# Patient Record
Sex: Male | Born: 1955 | Hispanic: Yes | Smoking: Never smoker
Health system: Southern US, Community
[De-identification: ages and names within clinical notes are randomized; demographics above are authoritative.]

## PROBLEM LIST (undated history)

## (undated) DIAGNOSIS — M1990 Unspecified osteoarthritis, unspecified site: Secondary | ICD-10-CM

## (undated) DIAGNOSIS — I10 Essential (primary) hypertension: Secondary | ICD-10-CM

## (undated) DIAGNOSIS — D649 Anemia, unspecified: Secondary | ICD-10-CM

## (undated) DIAGNOSIS — E119 Type 2 diabetes mellitus without complications: Secondary | ICD-10-CM

## (undated) DIAGNOSIS — Z87442 Personal history of urinary calculi: Secondary | ICD-10-CM

## (undated) DIAGNOSIS — Z8719 Personal history of other diseases of the digestive system: Secondary | ICD-10-CM

## (undated) HISTORY — PX: EYE SURGERY: SHX253

## (undated) HISTORY — DX: Anemia, unspecified: D64.9

## (undated) HISTORY — DX: Type 2 diabetes mellitus without complications: E11.9

## (undated) HISTORY — DX: Unspecified osteoarthritis, unspecified site: M19.90

## (undated) HISTORY — DX: Essential (primary) hypertension: I10

---

## 1999-05-31 DIAGNOSIS — B1920 Unspecified viral hepatitis C without hepatic coma: Secondary | ICD-10-CM

## 1999-05-31 HISTORY — DX: Unspecified viral hepatitis C without hepatic coma: B19.20

## 2015-02-28 ENCOUNTER — Ambulatory Visit (INDEPENDENT_AMBULATORY_CARE_PROVIDER_SITE_OTHER): Payer: Managed Care, Other (non HMO) | Admitting: Family Medicine

## 2015-02-28 VITALS — BP 128/80 | HR 91 | Temp 98.4°F | Resp 18 | Ht 70.0 in | Wt 187.4 lb

## 2015-02-28 DIAGNOSIS — L910 Hypertrophic scar: Secondary | ICD-10-CM | POA: Diagnosis not present

## 2015-02-28 MED ORDER — FLUOCINONIDE-E 0.05 % EX CREA: 1.0000 "application " | TOPICAL_CREAM | Freq: Two times a day (BID) | CUTANEOUS | Status: DC

## 2015-02-28 NOTE — Patient Instructions (Addendum)
This appears to be a keloid, or hypertrophic scar. This happens to some people after a significant injury to the skin. What happens is that the skin forms a significantly extra amount of scar tissue forming a thickened skin persists the rest of the person's life.  If you feel that this is increasing in size, or has changed very little in 2 weeks, please call me and I will arrange a dermatology referral

## 2015-02-28 NOTE — Progress Notes (Signed)
 @  This chart was scribed for Elvina Sidle, MD by Andrew Au, ED Scribe. This patient was seen in room 8 and the patient's care was started at 3:16 PM.  Patient ID: Kenneth Joseph MRN: [id number], DOB: [birth year], 59 y.o. Date of Encounter: 02/28/2015, 3:17 PM  Primary Physician: No primary care provider on file.  Chief Complaint:  Chief Complaint  Patient presents with   Knee Injury    Scraped knee on 11/11/14-hit smae spot about a month ago & removed skin again. Not healing properly & itches around the edge    HPI: 59 y.o. year old male with history below presents with wound. Pt fell on 6/14 and scrapped his knee. States it took the wound about  1 month heal but bumped his knee causing skin to fall off and the wound to bleed. Wound is scarred over at this time with discoloration but does not have pan to knee. States pain went away about 1 month ago. He reports its been over 3 months and wound has not healed properly. He has tried cortisone cream without relief to scar.    Past Medical History  Diagnosis Date   Anemia    Arthritis    Diabetes mellitus without complication (HCC)    Hypertension      Home Meds: Prior to Admission medications   Medication Sig Start Date End Date Taking? Authorizing Provider  amLODipine-olmesartan (AZOR) 10-40 MG tablet Take 0.5 tablets by mouth daily.   Yes Historical Provider, MD  meclizine (ANTIVERT) 25 MG tablet Take 25 mg by mouth daily as needed for dizziness.   Yes Historical Provider, MD  MELOXICAM PO Take by mouth daily.   Yes Historical Provider, MD  SITagliptin-MetFORMIN HCl (JANUMET PO) Take by mouth 2 (two) times daily.   Yes Historical Provider, MD    Allergies: No Known Allergies  Social History   Social History   Marital Status: Legally Separated    Spouse Name: N/A   Number of Children: N/A   Years of Education: N/A   Occupational History   Not on file.   Social History Main Topics   Smoking status:  Never Smoker    Smokeless tobacco: Not on file   Alcohol Use: 0.0 oz/week    0 Standard drinks or equivalent per week     Comment: occasionally   Drug Use: Not on file   Sexual Activity: Not on file   Other Topics Concern   Not on file   Social History Narrative   No narrative on file     Review of Systems: Constitutional: negative for chills, fever, night sweats, weight changes, or fatigue  HEENT: negative for vision changes, hearing loss, congestion, rhinorrhea, ST, epistaxis, or sinus pressure Cardiovascular: negative for chest pain or palpitations Respiratory: negative for hemoptysis, wheezing, shortness of breath, or cough Abdominal: negative for abdominal pain, nausea, vomiting, diarrhea, or constipation Dermatological: negative for rash Neurologic: negative for headache, dizziness, or syncope All other systems reviewed and are otherwise negative with the exception to those above and in the HPI.   Physical Exam: Blood pressure 128/80, pulse 91, temperature 98.4 F (36.9 C), temperature source Oral, resp. rate 18, height  (1.778 m), weight 187 lb 6 oz (84.993 kg), SpO2 97 %., Body mass index is 26.89 kg/(m^2). General: Well developed, well nourished, in no acute distress. Head: Normocephalic, atraumatic, eyes without discharge, sclera non-icteric, nares are without discharge. Bilateral auditory canals clear, TM's are without perforation, pearly grey and translucent with reflective  cone of light bilaterally. Oral cavity moist, posterior pharynx without exudate, erythema, peritonsillar abscess, or post nasal drip.  Neck: Supple. No thyromegaly. Full ROM. No lymphadenopathy. Lungs: Clear bilaterally to auscultation without wheezes, rales, or rhonchi. Breathing is unlabored. Heart: RRR with S1 S2. No murmurs, rubs, or gallops appreciated. Abdomen: Soft, non-tender, non-distended with normoactive bowel sounds. No hepatomegaly. No rebound/guarding. No obvious abdominal  masses. Msk:  Strength and tone normal for age. Extremities/Skin: Warm and dry. No clubbing or cyanosis. No edema. No rashes 2cm polygonal thicken violaceouslesion over left inter patellar tendon Neuro: Alert and oriented X 3. Moves all extremities spontaneously. Gait is normal. CNII-XII grossly in tact. Psych:  Responds to questions appropriately with a normal affect.   ASSESSMENT AND PLAN:  59 y.o. year old male with what appears to be a keloid. We are going to try high-dose terrorize for 2 weeks and if there is no improvement, refer to dermatology This chart was scribed in my presence and reviewed by me personally.    ICD-9-CM ICD-10-CM   1. Keloid 701.4 L91.0 fluocinonide-emollient (LIDEX-E) 0.05 % cream     Signed, Elvina Sidle, MD    By signing my name below, I, Raven Small, attest that this documentation has been prepared under the direction and in the presence of Elvina Sidle, MD.  Electronically Signed: Andrew Au, ED Scribe. 02/28/2015. 3:24 PM.  Signed, Elvina Sidle, MD 02/28/2015 3:17 PM

## 2015-03-26 ENCOUNTER — Ambulatory Visit (INDEPENDENT_AMBULATORY_CARE_PROVIDER_SITE_OTHER): Payer: Managed Care, Other (non HMO) | Admitting: Family Medicine

## 2015-03-26 ENCOUNTER — Ambulatory Visit (INDEPENDENT_AMBULATORY_CARE_PROVIDER_SITE_OTHER): Payer: Managed Care, Other (non HMO)

## 2015-03-26 VITALS — BP 110/76 | HR 87 | Temp 98.3°F | Resp 18 | Ht 70.0 in | Wt 189.0 lb

## 2015-03-26 DIAGNOSIS — R1031 Right lower quadrant pain: Secondary | ICD-10-CM | POA: Diagnosis not present

## 2015-03-26 DIAGNOSIS — R319 Hematuria, unspecified: Secondary | ICD-10-CM

## 2015-03-26 DIAGNOSIS — R3911 Hesitancy of micturition: Secondary | ICD-10-CM | POA: Diagnosis not present

## 2015-03-26 DIAGNOSIS — R109 Unspecified abdominal pain: Secondary | ICD-10-CM | POA: Diagnosis not present

## 2015-03-26 LAB — POCT URINALYSIS DIP (MANUAL ENTRY)
GLUCOSE UA: NEGATIVE
Leukocytes, UA: NEGATIVE
Nitrite, UA: NEGATIVE
Protein Ur, POC: >=300 — AB
Urobilinogen, UA: 1
pH, UA: 5

## 2015-03-26 LAB — COMPLETE METABOLIC PANEL WITH GFR
ALBUMIN: 4.3 g/dL (ref 3.6–5.1)
ALK PHOS: 71 U/L (ref 40–115)
ALT: 21 U/L (ref 9–46)
AST: 24 U/L (ref 10–35)
BILIRUBIN TOTAL: 0.6 mg/dL (ref 0.2–1.2)
BUN: 14 mg/dL (ref 7–25)
CALCIUM: 9.4 mg/dL (ref 8.6–10.3)
CO2: 25 mmol/L (ref 20–31)
Chloride: 103 mmol/L (ref 98–110)
Creat: 1.22 mg/dL (ref 0.70–1.25)
GFR, EST AFRICAN AMERICAN: 74 mL/min (ref >=60)
GFR, EST NON AFRICAN AMERICAN: 64 mL/min (ref >=60)
Glucose, Bld: 111 mg/dL — ABNORMAL HIGH (ref 65–99)
POTASSIUM: 4.3 mmol/L (ref 3.5–5.3)
SODIUM: 139 mmol/L (ref 135–146)
TOTAL PROTEIN: 7.5 g/dL (ref 6.1–8.1)

## 2015-03-26 LAB — POC MICROSCOPIC URINALYSIS (UMFC): MUCUS RE: ABSENT

## 2015-03-26 LAB — POCT CBC
GRANULOCYTE PERCENT: 72.8 % (ref 37–80)
HEMATOCRIT: 49.5 % (ref 43.5–53.7)
Hemoglobin: 16.9 g/dL (ref 14.1–18.1)
Lymph, poc: 1.7 (ref 0.6–3.4)
MCH, POC: 32.7 pg — AB (ref 27–31.2)
MCHC: 34.2 g/dL (ref 31.8–35.4)
MCV: 95.5 fL (ref 80–97)
MID (CBC): 0.7 (ref 0–0.9)
MPV: 8.7 fL (ref 0–99.8)
POC GRANULOCYTE: 6.6 (ref 2–6.9)
POC LYMPH %: 19.1 % (ref 10–50)
POC MID %: 8.1 % (ref 0–12)
Platelet Count, POC: 147 10*3/uL (ref 142–424)
RBC: 5.18 M/uL (ref 4.69–6.13)
RDW, POC: 13.8 %
WBC: 9 10*3/uL (ref 4.6–10.2)

## 2015-03-26 MED ORDER — CIPROFLOXACIN HCL 500 MG PO TABS: 500.0000 mg | ORAL_TABLET | Freq: Two times a day (BID) | ORAL | Status: DC

## 2015-03-26 NOTE — Patient Instructions (Signed)
Blood in urine may be due to kidney stone or possible prostate or bladder infection with your difficulty in starting urination.  Start Cipro as discussed, and follow up in next 3 days. Sooner or to the emergency room if you are unable to urinate.  You should receive a call or letter about your lab results within the next week to 10 days.  Return to the clinic or go to the nearest emergency room if any of your symptoms worsen or new symptoms occur.  Hematuria, Adult Hematuria is blood in your urine. It can be caused by a bladder infection, kidney infection, prostate infection, kidney stone, or cancer of your urinary tract. Infections can usually be treated with medicine, and a kidney stone usually will pass through your urine. If neither of these is the cause of your hematuria, further workup to find out the reason may be needed. It is very important that you tell your health care provider about any blood you see in your urine, even if the blood stops without treatment or happens without causing pain. Blood in your urine that happens and then stops and then happens again can be a symptom of a very serious condition. Also, pain is not a symptom in the initial stages of many urinary cancers. HOME CARE INSTRUCTIONS   Drink lots of fluid, 3-4 quarts a day. If you have been diagnosed with an infection, cranberry juice is especially recommended, in addition to large amounts of water.  Avoid caffeine, tea, and carbonated beverages because they tend to irritate the bladder.  Avoid alcohol because it may irritate the prostate.  Take all medicines as directed by your health care provider.  If you were prescribed an antibiotic medicine, finish it all even if you start to feel better.  If you have been diagnosed with a kidney stone, follow your health care provider's instructions regarding straining your urine to catch the stone.  Empty your bladder often. Avoid holding urine for long periods of  time.  After a bowel movement, women should cleanse front to back. Use each tissue only once.  Empty your bladder before and after sexual intercourse if you are a male. SEEK MEDICAL CARE IF:  You develop back pain.  You have a fever.  You have a feeling of sickness in your stomach (nausea) or vomiting.  Your symptoms are not better in 3 days. Return sooner if you are getting worse. SEEK IMMEDIATE MEDICAL CARE IF:   You develop severe vomiting and are unable to keep the medicine down.  You develop severe back or abdominal pain despite taking your medicines.  You begin passing a large amount of blood or clots in your urine.  You feel extremely weak or faint, or you pass out. MAKE SURE YOU:   Understand these instructions.  Will watch your condition.  Will get help right away if you are not doing well or get worse.   This information is not intended to replace advice given to you by your health care provider. Make sure you discuss any questions you have with your health care provider.   Document Released: 05/16/2005 Document Revised: 06/06/2014 Document Reviewed: 01/14/2013 Elsevier Interactive Patient Education [employer] Inc[employer]

## 2015-03-26 NOTE — Progress Notes (Addendum)
Subjective:  This chart was scribed for Meredith Staggers, MD by Broadus John, Medical Scribe. This patient was seen in Room 5 and the patient's care was started at 1:44 PM.   Patient ID: Kenneth Joseph, male    DOB: 1955/12/24, 59 y.o.   MRN: [id number]  Chief Complaint  Patient presents with  . Abdominal Pain    this morning   . Back Pain    lower left side, last week pain on rt side down towards groin   . Hematuria    today     HPI HPI Comments: Kenneth Joseph is a 59 y.o. male with a PMHx DM, HTN, HLD of who presents to [facility] complaining of a suprapubic abdominal pain and hematuria, onset today.  Pt reports that the abdominal pain started last week in the right flank but has resolved. However the current abdominal pain started this morning in his supra pubic area, and he indicates that he does not experience the pain currently, only upset stomack, however he has been experiencing tenderness in the left side of the abdomen that radiates to his back, the pain is still mildly present now. Pt indicates that he was not experiencing any urinary symptoms yesterday, last urination was last night, however he has been experiencing some urinary retention today, and he was only able to urinate very small amounts that was accompanied with a reddish color. He also notes symptoms of nausea, and very mild diarrhea- pt had a bowel movement this morning. Pt denies constipation, hematochezia, vomiting, fever. Pt has a history of 2 episodes of kidney stones previously, however no history of bladder infection, or previous prostrated testing. Pt reports that he did have a colonoscopy done, for which he had some polyps present. Thinks he had normal DRE 3 years ago.   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   There are no active problems to display for this patient.  Past Medical History  Diagnosis Date  . Anemia     . Arthritis   . Diabetes mellitus without complication (HCC)   . Hypertension    History reviewed. No pertinent past surgical history. No Known Allergies Prior to Admission medications   Medication Sig Start Date End Date Taking? Authorizing Provider  amLODipine-olmesartan (AZOR) 10-40 MG tablet Take 0.5 tablets by mouth daily.   Yes Historical Provider, MD  atorvastatin (LIPITOR) 80 MG tablet Take 80 mg by mouth daily at 6 PM.   Yes Historical Provider, MD  fluocinonide-emollient (LIDEX-E) 0.05 % cream Apply 1 application topically 2 (two) times daily. 02/28/15  Yes Elvina Sidle, MD  meclizine (ANTIVERT) 25 MG tablet Take 25 mg by mouth daily as needed for dizziness.   Yes Historical Provider, MD  meloxicam (MOBIC) 15 MG tablet Take 15 mg by mouth daily.   Yes Historical Provider, MD  SITagliptin-MetFORMIN HCl (JANUMET PO) Take by mouth 2 (two) times daily.   Yes Historical Provider, MD   Social History   Social History  . Marital Status: Legally Separated    Spouse Name: N/A  . Number of Children: N/A  . Years of Education: N/A   Occupational History  . Not on file.   Social History Main Topics  . Smoking status: Never Smoker   . Smokeless tobacco: Not on file  . Alcohol Use: 0.0 oz/week    0 Standard drinks or equivalent per week     Comment:  occasionally  . Drug Use: Not on file  . Sexual Activity: Not on file   Other Topics Concern  . Not on file   Social History Narrative    Review of Systems  Constitutional: Negative for fever.  Gastrointestinal: Positive for nausea, abdominal pain and diarrhea. Negative for vomiting, constipation and blood in stool.  Genitourinary: Positive for hematuria, flank pain and difficulty urinating.  Musculoskeletal: Positive for back pain.       Objective:   Physical Exam  Constitutional: He is oriented to person, place, and time. He appears well-developed and well-nourished. No distress.  HENT:  Head: Normocephalic and  atraumatic.  Eyes: EOM are normal. Pupils are equal, round, and reactive to light.  Neck: Neck supple.  Cardiovascular: Normal rate, regular rhythm and normal heart sounds.  Exam reveals no gallop and no friction rub.   No murmur heard. Pulmonary/Chest: Breath sounds normal. No respiratory distress. He has no wheezes. He has no rales.  Abdominal: There is tenderness. There is CVA tenderness (Minimal discomfort left CVA) and tenderness at McBurney's point (Minimal tenderness).  No discomfort in the back. Negative heel jar.   Genitourinary: Prostate is not tender (no tenderness, no apparent nodules, but exam limited to distal prostate only. ).  Neurological: He is alert and oriented to person, place, and time. No cranial nerve deficit.  Skin: Skin is warm and dry.  Psychiatric: He has a normal mood and affect. His behavior is normal.  Nursing note and vitals reviewed.   Filed Vitals:   03/26/15 1233  BP: 110/76  Pulse: 87  Temp: 98.3 F (36.8 C)  TempSrc: Oral  Resp: 18  Height:  (1.778 m)  Weight: 189 lb (85.73 kg)  SpO2: 96%   Results for orders placed or performed in visit on 03/26/15  POCT urinalysis dipstick  Result Value Ref Range   Color, UA brown (A) yellow   Clarity, UA cloudy (A) clear   Glucose, UA negative negative   Bilirubin, UA moderate (A) negative   Ketones, POC UA small (15) (A) negative   Spec Grav, UA >=1.030    Blood, UA large (A) negative   pH, UA 5.0    Protein Ur, POC >=300 (A) negative   Urobilinogen, UA 1.0    Nitrite, UA Negative Negative   Leukocytes, UA Negative Negative  POCT Microscopic Urinalysis ([facility])  Result Value Ref Range   WBC,UR,HPF,POC Few (A) None WBC/hpf   RBC,UR,HPF,POC Many (A) None RBC/hpf   Bacteria None None, Too numerous to count   Mucus Absent Absent   Epithelial Cells, UR Per Microscopy Few (A) None, Too numerous to count cells/hpf  POCT CBC  Result Value Ref Range   WBC 9.0 4.6 - 10.2 K/uL   Lymph, poc 1.7 0.6  - 3.4   POC LYMPH PERCENT 19.1 10 - 50 %L   MID (cbc) 0.7 0 - 0.9   POC MID % 8.1 0 - 12 %M   POC Granulocyte 6.6 2 - 6.9   Granulocyte percent 72.8 37 - 80 %G   RBC 5.18 4.69 - 6.13 M/uL   Hemoglobin 16.9 14.1 - 18.1 g/dL   HCT, POC 16.1 09.6 - 53.7 %   MCV 95.5 80 - 97 fL   MCH, POC 32.7 (A) 27 - 31.2 pg   MCHC 34.2 31.8 - 35.4 g/dL   RDW, POC 04.5 %   Platelet Count, POC 147 142 - 424 K/uL   MPV 8.7 0 - 99.8 fL  [facility] reading (PRIMARY) by  Dr. Neva SeatGreene: Abd 1 view - multiple phlebolith or nephroliths in bladder area.       Assessment & Plan:   Kenneth Joseph is a 59 y.o. male Hematuria - Plan: POCT urinalysis dipstick, POCT Microscopic Urinalysis ([facility]), PSA, DG Abd 1 View, COMPLETE METABOLIC PANEL WITH GFR, ciprofloxacin (CIPRO) 500 MG tablet, CANCELED: Basic metabolic panel  RLQ abdominal pain - Plan: POCT CBC, COMPLETE METABOLIC PANEL WITH GFR, Urine culture, CANCELED: Basic metabolic panel  Urinary hesitancy - Plan: POCT urinalysis dipstick, POCT Microscopic Urinalysis ([facility]), PSA, Urine culture, ciprofloxacin (CIPRO) 500 MG tablet  Left flank pain - Plan: DG Abd 1 View  Possible nephrolithiasis, or acute prostatitis with some hesitancy with urination. No apparent urinary retention on exam, but with some difficulty voiding morning of visit, did give caution that this may progress throughout the day. No apparent tenderness on prostate exam. Recent flank pain suggestive of nephrolithiasis. - Will cover with Cipro 500 mg twice a day, urine culture, psa, plan recheck in 48-72 hours for possible CT scanning, or to emergency room if signs or symptoms of retention or other worsening.  Meds ordered this encounter  Medications  . atorvastatin (LIPITOR) 80 MG tablet    Sig: Take 80 mg by mouth daily at 6 PM.  . meloxicam (MOBIC) 15 MG tablet    Sig: Take 15 mg by mouth daily.  . ciprofloxacin (CIPRO) 500 MG tablet    Sig: Take 1 tablet (500 mg total) by mouth 2 (two) times daily.     Dispense:  20 tablet    Refill:  0   Patient Instructions  Blood in urine may be due to kidney stone or possible prostate or bladder infection with your difficulty in starting urination.  Start Cipro as discussed, and follow up in next 3 days. Sooner or to the emergency room if you are unable to urinate.  You should receive a call or letter about your lab results within the next week to 10 days.  Return to the clinic or go to the nearest emergency room if any of your symptoms worsen or new symptoms occur.  Hematuria, Adult Hematuria is blood in your urine. It can be caused by a bladder infection, kidney infection, prostate infection, kidney stone, or cancer of your urinary tract. Infections can usually be treated with medicine, and a kidney stone usually will pass through your urine. If neither of these is the cause of your hematuria, further workup to find out the reason may be needed. It is very important that you tell your health care provider about any blood you see in your urine, even if the blood stops without treatment or happens without causing pain. Blood in your urine that happens and then stops and then happens again can be a symptom of a very serious condition. Also, pain is not a symptom in the initial stages of many urinary cancers. HOME CARE INSTRUCTIONS   Drink lots of fluid, 3-4 quarts a day. If you have been diagnosed with an infection, cranberry juice is especially recommended, in addition to large amounts of water.  Avoid caffeine, tea, and carbonated beverages because they tend to irritate the bladder.  Avoid alcohol because it may irritate the prostate.  Take all medicines as directed by your health care provider.  If you were prescribed an antibiotic medicine, finish it all even if you start to feel better.  If you have been diagnosed with a kidney stone, follow your health care  provider's instructions regarding straining your urine to catch the stone.  Empty your  bladder often. Avoid holding urine for long periods of time.  After a bowel movement, women should cleanse front to back. Use each tissue only once.  Empty your bladder before and after sexual intercourse if you are a male. SEEK MEDICAL CARE IF:  You develop back pain.  You have a fever.  You have a feeling of sickness in your stomach (nausea) or vomiting.  Your symptoms are not better in 3 days. Return sooner if you are getting worse. SEEK IMMEDIATE MEDICAL CARE IF:   You develop severe vomiting and are unable to keep the medicine down.  You develop severe back or abdominal pain despite taking your medicines.  You begin passing a large amount of blood or clots in your urine.  You feel extremely weak or faint, or you pass out. MAKE SURE YOU:   Understand these instructions.  Will watch your condition.  Will get help right away if you are not doing well or get worse.   This information is not intended to replace advice given to you by your health care provider. Make sure you discuss any questions you have with your health care provider.   Document Released: 05/16/2005 Document Revised: 06/06/2014 Document Reviewed: 01/14/2013 Elsevier Interactive Patient Education [employer]          By signing my name below, I, Rawaa Kenneth Joseph, attest that this documentation has been prepared under the direction and in the presence of Meredith Staggers, MD.  Watt Climes Joseph, Medical Scribe. 03/26/2015.  1:59 PM.   I personally performed the services described in this documentation, which was scribed in my presence. The recorded information has been reviewed and considered, and addended by me as needed.

## 2015-03-27 LAB — URINE CULTURE

## 2015-03-27 LAB — PSA: PSA: 0.93 ng/mL (ref <=4.00)

## 2015-04-21 ENCOUNTER — Ambulatory Visit (INDEPENDENT_AMBULATORY_CARE_PROVIDER_SITE_OTHER): Payer: Managed Care, Other (non HMO) | Admitting: Family Medicine

## 2015-04-21 VITALS — BP 132/80 | HR 95 | Temp 97.4°F | Resp 18 | Ht 71.0 in | Wt 186.0 lb

## 2015-04-21 DIAGNOSIS — R1030 Lower abdominal pain, unspecified: Secondary | ICD-10-CM

## 2015-04-21 DIAGNOSIS — R319 Hematuria, unspecified: Secondary | ICD-10-CM

## 2015-04-21 DIAGNOSIS — N3943 Post-void dribbling: Secondary | ICD-10-CM

## 2015-04-21 DIAGNOSIS — R109 Unspecified abdominal pain: Secondary | ICD-10-CM

## 2015-04-21 LAB — POCT URINALYSIS DIP (MANUAL ENTRY)
Bilirubin, UA: NEGATIVE
Glucose, UA: NEGATIVE
Ketones, POC UA: NEGATIVE
Leukocytes, UA: NEGATIVE
Nitrite, UA: NEGATIVE
Protein Ur, POC: 30 — AB
Spec Grav, UA: >=1.03
Urobilinogen, UA: 0.2
pH, UA: 5

## 2015-04-21 LAB — POC MICROSCOPIC URINALYSIS (UMFC)

## 2015-04-21 MED ORDER — HYDROCODONE-ACETAMINOPHEN 5-325 MG PO TABS: 1.0000 | ORAL_TABLET | Freq: Three times a day (TID) | ORAL | Status: DC | PRN

## 2015-04-21 MED ORDER — KETOROLAC TROMETHAMINE 30 MG/ML IJ SOLN
30.0000 mg | Freq: Once | INTRAMUSCULAR | Status: AC
  Administered 2015-04-21: 30 mg via INTRAMUSCULAR

## 2015-04-21 MED ORDER — TAMSULOSIN HCL 0.4 MG PO CAPS: 0.4000 mg | ORAL_CAPSULE | Freq: Every day | ORAL | Status: DC

## 2015-04-21 NOTE — Patient Instructions (Signed)
GO OVER TO THE [facility].  LET THEM KNOW THAT YOU NEED TO REGISTER AS AN OUTPATIENT FOR A CT SCAN.

## 2015-04-21 NOTE — Progress Notes (Signed)
Chief Complaint:  Chief Complaint  Patient presents with  . Urinary Retention    saturday   . Abdominal Pain  . Back Pain    low back     HPI: Kenneth Joseph is a 59 y.o. male who reports to [facility] today complaining of worsening 4/10-8/10 left sided abd pain and also left  flank pain. Prior he had it on the right side. He has a hx of  kidney stones, he has has urinary dribbling. He has had chills, started back again from 10/27 visit  In last 5 days and worsen this mormning at 2 am. He was on cipro but Urine cx was negative so stopped . Colonscopy with some some polyps  X 2 exams, he is UTD but needs to get a fu colonscopy . He has continued to dribble when he urinates sicne last OV, denies BPH, he had a DRE which was pretty unremarkable. No gross hematuria or melena. No n/w/t. No unintentional weightloss He had a dizzy spell and feeling weak due to pain.    Please see OV below from 03/26/2015: Kenneth Joseph is a 59 y.o. male with a PMHx DM, HTN, HLD of who presents to [facility] complaining of a suprapubic abdominal pain and hematuria, onset today.  Pt reports that the abdominal pain started last week in the right flank but has resolved. However the current abdominal pain started this morning in his supra pubic area, and he indicates that he does not experience the pain currently, only upset stomack, however he has been experiencing tenderness in the left side of the abdomen that radiates to his back, the pain is still mildly present now. Pt indicates that he was not experiencing any urinary symptoms yesterday, last urination was last night, however he has been experiencing some urinary retention today, and he was only able to urinate very small amounts that was accompanied with a reddish color. He also notes symptoms of nausea, and very mild diarrhea- pt had a bowel movement this morning. Pt denies constipation, hematochezia, vomiting, fever. Pt has a history of 2  episodes of kidney stones previously, however no history of bladder infection, or previous prostrated testing. Pt reports that he did have a colonoscopy done, for which he had some polyps present. Thinks he had normal DRE 3 years ago.   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    IMPRESSION: Indeterminate 3 mm density over the left sacral ala, potential stone given the history. Confident detection of calculus is limited by numerous pelvic phleboliths. No calculi seen over the renal shadows.   Electronically Signed  By: Marnee SpringJonathon Watts M.D.  On: 03/26/2015 15:05  Past Medical History  Diagnosis Date  . Anemia   . Arthritis   . Diabetes mellitus without complication (HCC)   . Hypertension    History reviewed. No pertinent past surgical history. Social History   Social History  . Marital Status: Legally Separated    Spouse Name: N/A  . Number of Children: N/A  . Years of Education: N/A   Social History Main Topics  . Smoking status: Never Smoker   . Smokeless tobacco: None  . Alcohol Use: 0.0 oz/week    0 Standard drinks or equivalent per week     Comment: occasionally  . Drug Use: None  . Sexual Activity: Not Asked   Other Topics Concern  . None   Social  History Narrative   Family History  Problem Relation Age of Onset  . Hypertension Mother   . Mental illness Mother   . Diabetes Father   . Heart disease Father   . Hypertension Father   . Stroke Father    No Known Allergies Prior to Admission medications   Medication Sig Start Date End Date Taking? Authorizing Provider  allopurinol (ZYLOPRIM) 300 MG tablet Take 300 mg by mouth daily.   Yes Historical Provider, MD  amLODipine-olmesartan (AZOR) 10-40 MG tablet Take 0.5 tablets by mouth daily.   Yes Historical Provider, MD  atorvastatin (LIPITOR) 80 MG tablet Take 80 mg by mouth daily at 6 PM.   Yes Historical Provider, MD  ciprofloxacin  (CIPRO) 500 MG tablet Take 1 tablet (500 mg total) by mouth 2 (two) times daily. 03/26/15  Yes Shade Flood, MD  colchicine 0.6 MG tablet Take 0.6 mg by mouth daily.   Yes Historical Provider, MD  fluocinonide-emollient (LIDEX-E) 0.05 % cream Apply 1 application topically 2 (two) times daily. 02/28/15  Yes Elvina Sidle, MD  meclizine (ANTIVERT) 25 MG tablet Take 25 mg by mouth daily as needed for dizziness.   Yes Historical Provider, MD  SITagliptin-MetFORMIN HCl (JANUMET PO) Take by mouth 2 (two) times daily.   Yes Historical Provider, MD  meloxicam (MOBIC) 15 MG tablet Take 15 mg by mouth daily.    Historical Provider, MD     ROS: The patient denies fevers, night sweats, unintentional weight loss, chest pain, palpitations, wheezing, dyspnea on exertion, nausea, vomiting, dysuria, melena, numbness, weakness, or tingling.   All other systems have been reviewed and were otherwise negative with the exception of those mentioned in the HPI and as above.    PHYSICAL EXAM: Filed Vitals:   04/21/15 1626  BP: 132/80  Pulse: 95  Temp: 97.4 F (36.3 C)  Resp: 18   Body mass index is 25.95 kg/(m^2).   General: Alert, no acute distress HEENT:  Normocephalic, atraumatic, oropharynx patent. EOMI, PERRLA Cardiovascular:  Regular rate and rhythm, no rubs murmurs or gallops.  No pedal edema.  Respiratory: Clear to auscultation bilaterally.  No wheezes, rales, or rhonchi.  No cyanosis, no use of accessory musculature Abdominal: No organomegaly, abdomen is soft and  LLQ + tender, positive bowel sounds. No masses. + Left CVA tenderness Skin: No rashes. Neurologic: Facial musculature symmetric. Psychiatric: Patient acts appropriately throughout our interaction. Lymphatic: No cervical or submandibular lymphadenopathy Musculoskeletal: Gait intact. No edema, tenderness   LABS: Results for orders placed or performed in visit on 04/21/15  POCT urinalysis dipstick  Result Value Ref Range   Color,  UA yellow yellow   Clarity, UA clear clear   Glucose, UA negative negative   Bilirubin, UA negative negative   Ketones, POC UA negative negative   Spec Grav, UA >=1.030    Blood, UA large (A) negative   pH, UA 5.0    Protein Ur, POC =30 (A) negative   Urobilinogen, UA 0.2    Nitrite, UA Negative Negative   Leukocytes, UA Negative Negative  POCT Microscopic Urinalysis ([facility])  Result Value Ref Range   WBC,UR,HPF,POC Few (A) None WBC/hpf   RBC,UR,HPF,POC Many (A) None RBC/hpf   Bacteria None None, Too numerous to count   Mucus Present (A) Absent   Epithelial Cells, UR Per Microscopy Few (A) None, Too numerous to count cells/hpf     EKG/XRAY:   Primary read interpreted by Dr. Conley Rolls at [facility].   ASSESSMENT/PLAN: Encounter Diagnoses  Name Primary?  . Urinary dribbling   . Hematuria   . Left flank pain   . Lower abdominal pain Yes   Stat CT scan CB to Dr Conley Rolls, rule out hydronephrosis , renal stones, less likely appenddicitis GB or inflammatory in etiology since patient has normal labs and sxs wax and wane until worsen in last 5 days , the worse is today. The abd and left flank pain moves, he has ahd some chills due to pain but without fever, he is trying to push fluids.  UTD on colonoscopy, last visit had unremarkable DRE and normal PSA Rx norco,  Flomax, he was given a strainer and also toradol 30 mg IM x 1 in office. Advise to go get Ct scan, push clear fluids at home   Gross sideeffects, risk and benefits, and alternatives of medications d/w patient. Patient is aware that all medications have potential sideeffects and we are unable to predict every sideeffect or drug-drug interaction that may occur.  Khylin Gutridge DO  04/21/2015 6:46 PM

## 2015-04-22 ENCOUNTER — Telehealth: Payer: Self-pay | Admitting: Family Medicine

## 2015-04-22 ENCOUNTER — Ambulatory Visit (HOSPITAL_COMMUNITY): Admission: RE | Admit: 2015-04-22 | Payer: Managed Care, Other (non HMO) | Source: Ambulatory Visit

## 2015-04-22 ENCOUNTER — Ambulatory Visit (HOSPITAL_COMMUNITY)
Admission: RE | Admit: 2015-04-22 | Discharge: 2015-04-22 | Disposition: A | Discharge status: Home / Self Care | Payer: Managed Care, Other (non HMO) | Source: Ambulatory Visit | Attending: Family Medicine | Admitting: Family Medicine

## 2015-04-22 DIAGNOSIS — N400 Enlarged prostate without lower urinary tract symptoms: Secondary | ICD-10-CM | POA: Diagnosis not present

## 2015-04-22 DIAGNOSIS — N132 Hydronephrosis with renal and ureteral calculous obstruction: Secondary | ICD-10-CM | POA: Insufficient documentation

## 2015-04-22 DIAGNOSIS — R110 Nausea: Secondary | ICD-10-CM | POA: Insufficient documentation

## 2015-04-22 DIAGNOSIS — N200 Calculus of kidney: Secondary | ICD-10-CM

## 2015-04-22 DIAGNOSIS — R109 Unspecified abdominal pain: Secondary | ICD-10-CM | POA: Diagnosis present

## 2015-04-22 DIAGNOSIS — K5730 Diverticulosis of large intestine without perforation or abscess without bleeding: Secondary | ICD-10-CM | POA: Insufficient documentation

## 2015-04-22 DIAGNOSIS — N1339 Other hydronephrosis: Secondary | ICD-10-CM

## 2015-04-22 DIAGNOSIS — R1030 Lower abdominal pain, unspecified: Secondary | ICD-10-CM | POA: Insufficient documentation

## 2015-04-22 NOTE — Telephone Encounter (Signed)
Spoke to patient aboutCT results, will refer to urology for renal stones with some hydronephrosis.

## 2015-04-27 ENCOUNTER — Ambulatory Visit (INDEPENDENT_AMBULATORY_CARE_PROVIDER_SITE_OTHER): Payer: Managed Care, Other (non HMO) | Admitting: Family Medicine

## 2015-04-27 ENCOUNTER — Ambulatory Visit (INDEPENDENT_AMBULATORY_CARE_PROVIDER_SITE_OTHER): Payer: Managed Care, Other (non HMO)

## 2015-04-27 VITALS — BP 102/68 | HR 105 | Temp 98.4°F | Resp 18 | Ht 71.0 in | Wt 186.0 lb

## 2015-04-27 DIAGNOSIS — E119 Type 2 diabetes mellitus without complications: Secondary | ICD-10-CM | POA: Diagnosis not present

## 2015-04-27 DIAGNOSIS — W19XXXA Unspecified fall, initial encounter: Secondary | ICD-10-CM | POA: Diagnosis not present

## 2015-04-27 DIAGNOSIS — T50905A Adverse effect of unspecified drugs, medicaments and biological substances, initial encounter: Secondary | ICD-10-CM

## 2015-04-27 DIAGNOSIS — T887XXA Unspecified adverse effect of drug or medicament, initial encounter: Secondary | ICD-10-CM | POA: Diagnosis not present

## 2015-04-27 DIAGNOSIS — I493 Ventricular premature depolarization: Secondary | ICD-10-CM

## 2015-04-27 DIAGNOSIS — M25572 Pain in left ankle and joints of left foot: Secondary | ICD-10-CM

## 2015-04-27 DIAGNOSIS — I951 Orthostatic hypotension: Secondary | ICD-10-CM | POA: Diagnosis not present

## 2015-04-27 DIAGNOSIS — I10 Essential (primary) hypertension: Secondary | ICD-10-CM

## 2015-04-27 DIAGNOSIS — R000 Tachycardia, unspecified: Secondary | ICD-10-CM

## 2015-04-27 DIAGNOSIS — N201 Calculus of ureter: Secondary | ICD-10-CM

## 2015-04-27 NOTE — Progress Notes (Signed)
Patient ID: Kenneth Joseph, male    DOB: [birth year]  Age: 59 y.o. MRN: [id number]  Chief Complaint  Patient presents with  . Loss of Consciousness    today  . Leg Injury    lower left leg, hurt when he fell    Subjective:   Patient had a fall this afternoon. It happened an hour or 2 ago. He bent over and stood back up felt lightheadedness and fell. He injured his left ankle. He came over here. [place]. He is been in several times already. He has had abdominal and flank pain. He was treated with Flomax starting about 6 days ago for kidney stones which has not passed yet. He had not been on this medicine before, and he is on other blood pressure medication. In addition to this, he is a diabetic. He checked his sugar shortly after his syncopal episode and it was 130s. He only hurt the ankle when he passed out. No chest pains or palpitations. No respiratory symptoms. No diaphoresis.  Current allergies, medications, problem list, past/family and social histories reviewed.  Objective:  BP 102/68 mmHg  Pulse 105  Temp(Src) 98.4 F (36.9 C)  Resp 18  Ht 5\' 11"  (1.803 m)  Wt 186 lb (84.369 kg)  BMI 25.95 kg/m2  SpO2 98%  Healthy-appearing man in no acute distress. Chest clear. Heart regular without murmur. Abdomen soft and nontender. Left ankle is swollen at the lateral malleolus and tender. Hurts to move the ankle around at all. Pulses good. Motion of toes intact. Heart rate was 121 hours listening to him. He did have to ectopy over about a 30 second interval of auscultation. Fully alert and oriented. EKG shows sinus tachycardia at 99. Low voltage.  Assessment & Plan:   Assessment: 1. Orthostatic syncope   2. Left ankle pain   3. Fall, initial encounter   4. Adverse effect of drug/medicinal, initial encounter   5. Ventricular ectopy   6. Essential hypertension   7. Type 2 diabetes mellitus without complication, without long-term current use of insulin  (HCC)   8. Ureterolithiasis   9. Tachycardia       Plan: X-ray ankle  Almost certainly the syncopal episode was from taking the Flomax in addition to being on blood pressure medication.  Orders Placed This Encounter  Procedures  . DG Ankle Complete Left    Order Specific Question:  Reason for Exam (SYMPTOM  OR DIAGNOSIS REQUIRED)    Answer:  left ankle pain lateral malleolus    Order Specific Question:  Preferred imaging location?    Answer:  External  . EKG 12-Lead    The syncope was almost certainly caused by orthostatic being on the tamsulosin. Recommend discontinuing that. If the kidney stones don't pass the urologist may need to do more for him. The urology referral is apparently pending.  Wear a soft splint on the ankle (Swede-O)  Return if      Patient Instructions  Stay off the Flomax  Drink lots of fluids  Take the meloxicam for pain. You can use the hydrocodone if really needed, but you probably do better to take less than more.      No Follow-up on file.   Mrk Buzby, MD 04/27/2015

## 2015-04-27 NOTE — Patient Instructions (Addendum)
Stay off the Flomax (tamsulosin)  Drink lots of fluids  Take the meloxicam for pain. You can use the hydrocodone if really needed, but you probably do better to take less than more.  Wear the ankle brace  Return if the ankle is not improving  I imagine that the label of the holiday weekend, but if he is here from that in the next couple of days call back and speak to the referral's desk.

## 2015-05-19 ENCOUNTER — Telehealth: Payer: Self-pay

## 2015-05-19 NOTE — Telephone Encounter (Signed)
Does he need to sign a release?

## 2015-05-19 NOTE — Telephone Encounter (Signed)
Patient is in the office now. He signed ROI form for copy of CT report. Report printed and taken to clerical TL [facility](Jasmine).

## 2015-05-19 NOTE — Telephone Encounter (Signed)
Patient is coming to pick up lab results. He states he's leaving out of town in the morning he'll be here in 30 minutes to get his lab results printed

## 2016-08-09 ENCOUNTER — Emergency Department (HOSPITAL_COMMUNITY)
Admission: EM | Admit: 2016-08-09 | Discharge: 2016-08-09 | Disposition: A | Discharge status: Home / Self Care | Payer: 59 | Attending: Emergency Medicine | Admitting: Emergency Medicine

## 2016-08-09 ENCOUNTER — Encounter (HOSPITAL_COMMUNITY): Payer: Self-pay | Admitting: Emergency Medicine

## 2016-08-09 DIAGNOSIS — H8102 Meniere's disease, left ear: Secondary | ICD-10-CM

## 2016-08-09 DIAGNOSIS — R42 Dizziness and giddiness: Secondary | ICD-10-CM | POA: Diagnosis present

## 2016-08-09 DIAGNOSIS — E119 Type 2 diabetes mellitus without complications: Secondary | ICD-10-CM | POA: Insufficient documentation

## 2016-08-09 DIAGNOSIS — I10 Essential (primary) hypertension: Secondary | ICD-10-CM | POA: Insufficient documentation

## 2016-08-09 DIAGNOSIS — Z7982 Long term (current) use of aspirin: Secondary | ICD-10-CM | POA: Insufficient documentation

## 2016-08-09 LAB — CBC WITH DIFFERENTIAL/PLATELET
BASOS ABS: 0 10*3/uL (ref 0.0–0.1)
Basophils Relative: 0 %
EOS ABS: 0.1 10*3/uL (ref 0.0–0.7)
EOS PCT: 1 %
HCT: 39.6 % (ref 39.0–52.0)
Hemoglobin: 13.7 g/dL (ref 13.0–17.0)
LYMPHS PCT: 10 %
Lymphs Abs: 0.8 10*3/uL (ref 0.7–4.0)
MCH: 33.2 pg (ref 26.0–34.0)
MCHC: 34.6 g/dL (ref 30.0–36.0)
MCV: 95.9 fL (ref 78.0–100.0)
MONO ABS: 0.7 10*3/uL (ref 0.1–1.0)
Monocytes Relative: 8 %
Neutro Abs: 6.4 10*3/uL (ref 1.7–7.7)
Neutrophils Relative %: 81 %
PLATELETS: 143 10*3/uL — AB (ref 150–400)
RBC: 4.13 MIL/uL — AB (ref 4.22–5.81)
RDW: 12.9 % (ref 11.5–15.5)
WBC: 7.9 10*3/uL (ref 4.0–10.5)

## 2016-08-09 LAB — BASIC METABOLIC PANEL
ANION GAP: 6 (ref 5–15)
BUN: 16 mg/dL (ref 6–20)
CALCIUM: 8 mg/dL — AB (ref 8.9–10.3)
CO2: 23 mmol/L (ref 22–32)
Chloride: 111 mmol/L (ref 101–111)
Creatinine, Ser: 0.89 mg/dL (ref 0.61–1.24)
GFR calc Af Amer: >60 mL/min (ref >60)
GLUCOSE: 130 mg/dL — AB (ref 65–99)
Potassium: 3.7 mmol/L (ref 3.5–5.1)
SODIUM: 140 mmol/L (ref 135–145)

## 2016-08-09 MED ORDER — DIAZEPAM 5 MG PO TABS
5.0000 mg | ORAL_TABLET | Freq: Once | ORAL | Status: AC
  Administered 2016-08-09: 5 mg via ORAL
  Filled 2016-08-09: qty 1

## 2016-08-09 MED ORDER — ONDANSETRON 4 MG PO TBDP: 4.0000 mg | ORAL_TABLET | Freq: Three times a day (TID) | ORAL | 0 refills | Status: DC | PRN

## 2016-08-09 MED ORDER — PROMETHAZINE HCL 25 MG/ML IJ SOLN
12.5000 mg | Freq: Once | INTRAMUSCULAR | Status: AC
  Administered 2016-08-09: 12.5 mg via INTRAVENOUS
  Filled 2016-08-09: qty 1

## 2016-08-09 MED ORDER — SODIUM CHLORIDE 0.9 % IV BOLUS (SEPSIS)
1000.0000 mL | Freq: Once | INTRAVENOUS | Status: AC
  Administered 2016-08-09: 1000 mL via INTRAVENOUS

## 2016-08-09 MED ORDER — DIPHENHYDRAMINE HCL 50 MG/ML IJ SOLN
25.0000 mg | Freq: Once | INTRAMUSCULAR | Status: DC
  Filled 2016-08-09: qty 1

## 2016-08-09 MED ORDER — DIPHENHYDRAMINE HCL 50 MG/ML IJ SOLN
25.0000 mg | Freq: Once | INTRAMUSCULAR | Status: AC
Start: 2016-08-09 — End: 2016-08-09
  Administered 2016-08-09: 06:00:00 via INTRAVENOUS

## 2016-08-09 MED ORDER — DEXAMETHASONE SODIUM PHOSPHATE 10 MG/ML IJ SOLN
10.0000 mg | Freq: Once | INTRAMUSCULAR | Status: AC
  Administered 2016-08-09: 10 mg via INTRAVENOUS
  Filled 2016-08-09: qty 1

## 2016-08-09 MED ORDER — PROCHLORPERAZINE EDISYLATE 5 MG/ML IJ SOLN
10.0000 mg | Freq: Once | INTRAMUSCULAR | Status: AC
  Administered 2016-08-09: 10 mg via INTRAVENOUS
  Filled 2016-08-09: qty 2

## 2016-08-09 MED ORDER — DIAZEPAM 5 MG PO TABS: 5.0000 mg | ORAL_TABLET | Freq: Two times a day (BID) | ORAL | 0 refills | Status: DC | PRN

## 2016-08-09 MED ORDER — MECLIZINE HCL 25 MG PO TABS
25.0000 mg | ORAL_TABLET | Freq: Once | ORAL | Status: AC
  Administered 2016-08-09: 25 mg via ORAL
  Filled 2016-08-09: qty 1

## 2016-08-09 NOTE — ED Notes (Signed)
Pt attempted to ambulate states dizziness increased a great deal when he stood up and felt as if he were going to fall after taking one step. Attempt to ambulate unsuccessful.

## 2016-08-09 NOTE — ED Provider Notes (Signed)
[facility]-EMERGENCY DEPT Provider Note   CSN: [id number] Arrival date & time: 08/09/16  0147  By signing my name below, I, [name], attest that this documentation has been prepared under the direction and in the presence of Shon Batonourtney F Mattie Novosel, MD. Electronically Signed: Elder Negusussell Johnston, Scribe. 08/09/16. 2:24 AM.   History   Chief Complaint Chief Complaint  Patient presents with  . Dizziness    HPI Kenneth Joseph is a 61 y.o. male with history of meniere's disease, HTN, and diabetes who presents to the ED for evaluation of dizziness. This patient states that several hours ago he first developed L ear tinnitus with throbbing ear pain. At interview, he is reporting severe dizziness which he describes as "feeling off-balance". Worse when opening his eyes, changing head position. He is having difficulty standing. Also reporting pain to the top of his head with double vision, nausea, and dyspnea. No vomiting. No chest pain. No decreased sensation or loss of function distally.   The history is provided by the patient. No language interpreter was used.  Dizziness  Quality:  Imbalance Severity:  Severe Onset quality:  Gradual Timing:  Constant Chronicity:  Recurrent Context: ear pain and head movement   Context comment:  Tinnitus, eye opening Relieved by:  Nothing Associated symptoms: headaches, nausea, shortness of breath and tinnitus   Associated symptoms: no chest pain, no vomiting and no weakness   Risk factors: Meniere's disease     Past Medical History:  Diagnosis Date  . Anemia   . Arthritis   . Diabetes mellitus without complication (HCC)   . Hypertension     There are no active problems to display for this patient.   History reviewed. No pertinent surgical history.     Home Medications    Prior to Admission medications   Medication Sig Start Date End Date Taking? Authorizing Provider  allopurinol (ZYLOPRIM) 300 MG tablet Take 300 mg by mouth daily.   Yes  Historical Provider, MD  amLODipine (NORVASC) 10 MG tablet Take 10 mg by mouth daily.   Yes Historical Provider, MD  aspirin EC 81 MG tablet Take 81 mg by mouth daily.   Yes Historical Provider, MD  atorvastatin (LIPITOR) 80 MG tablet Take 80 mg by mouth daily at 6 PM.   Yes Historical Provider, MD  meclizine (ANTIVERT) 25 MG tablet Take 25 mg by mouth daily as needed for dizziness.   Yes Historical Provider, MD  naproxen (NAPROSYN) 500 MG tablet Take 500 mg by mouth daily.   Yes Historical Provider, MD  olmesartan (BENICAR) 40 MG tablet Take 20 mg by mouth daily.   Yes Historical Provider, MD  sitaGLIPtin-metformin (JANUMET) 50-500 MG tablet Take 0.5 tablets by mouth 2 (two) times daily.   Yes Historical Provider, MD  diazepam (VALIUM) 5 MG tablet Take 1 tablet (5 mg total) by mouth every 12 (twelve) hours as needed (dizziness). 08/09/16   Shon Batonourtney F Tierra Divelbiss, MD  fluocinonide-emollient (LIDEX-E) 0.05 % cream Apply 1 application topically 2 (two) times daily. Patient not taking: Reported on 08/09/2016 02/28/15   Elvina SidleKurt Lauenstein, MD  HYDROcodone-acetaminophen [facility](NORCO) 5-325 MG tablet Take 1 tablet by mouth every 8 (eight) hours as needed for moderate pain. Monitor for constipation, no tylenol with this. Patient not taking: Reported on 04/27/2015 04/21/15   Thao P Le, DO  ondansetron (ZOFRAN ODT) 4 MG disintegrating tablet Take 1 tablet (4 mg total) by mouth every 8 (eight) hours as needed for nausea or vomiting. 08/09/16   Shon Batonourtney F Adyn Serna,  MD  tamsulosin (FLOMAX) 0.4 MG CAPS capsule Take 1 capsule (0.4 mg total) by mouth daily. Patient not taking: Reported on 08/09/2016 04/21/15   Lenell Antu, DO    Family History Family History  Problem Relation Age of Onset  . Hypertension Mother   . Mental illness Mother   . Diabetes Father   . Heart disease Father   . Hypertension Father   . Stroke Father     Social History Social History  Substance Use Topics  . Smoking status: Never Smoker  . Smokeless  tobacco: Never Used  . Alcohol use 0.0 oz/week     Comment: occasionally     Allergies   Patient has no known allergies.   Review of Systems Review of Systems  HENT: Positive for ear pain and tinnitus.   Respiratory: Positive for shortness of breath.   Cardiovascular: Negative for chest pain.  Gastrointestinal: Positive for nausea. Negative for vomiting.  Neurological: Positive for dizziness and headaches. Negative for weakness and numbness.  All other systems reviewed and are negative.    Physical Exam Updated Vital Signs BP 118/83 (BP Location: Left Arm)   Pulse 76   Temp 97.4 F (36.3 C) (Oral)   Resp 18   Ht 5\' 11"  (1.803 m)   Wt 177 lb (80.3 kg)   SpO2 93%   BMI 24.69 kg/m   Physical Exam  Constitutional: He is oriented to person, place, and time. He appears well-developed and well-nourished.  Uncomfortable appearing but nontoxic. Eyes closed.  HENT:  Head: Normocephalic and atraumatic.  Bilateral TMs clear  Eyes: EOM are normal. Pupils are equal, round, and reactive to light.  Pupils 4 mm reactive bilaterally  Neck: Neck supple.  Cardiovascular: Normal rate, regular rhythm and normal heart sounds.   No murmur heard. Pulmonary/Chest: Effort normal and breath sounds normal. No respiratory distress. He has no wheezes.  Abdominal: Soft. Bowel sounds are normal. There is no tenderness. There is no rebound.  Musculoskeletal: He exhibits no edema.  Lymphadenopathy:    He has no cervical adenopathy.  Neurological: He is alert and oriented to person, place, and time.  Cranial 2 through 12 intact, no dysmetria to finger-nose-finger, 5 out of 5 strength in all 4 extremities  Skin: Skin is warm and dry.  Psychiatric: He has a normal mood and affect.  Nursing note and vitals reviewed.    ED Treatments / Results  Labs (all labs ordered are listed, but only abnormal results are displayed) Labs Reviewed  CBC WITH DIFFERENTIAL/PLATELET - Abnormal; Notable for the  following:       Result Value   RBC 4.13 (*)    Platelets 143 (*)    All other components within normal limits  BASIC METABOLIC PANEL - Abnormal; Notable for the following:    Glucose, Bld 130 (*)    Calcium 8.0 (*)    All other components within normal limits    EKG  EKG Interpretation None       Radiology No results found.  Procedures Procedures (including critical care time)  Medications Ordered in ED Medications  sodium chloride 0.9 % bolus 1,000 mL (0 mLs Intravenous Stopped 08/09/16 0415)  promethazine (PHENERGAN) injection 12.5 mg (12.5 mg Intravenous Given 08/09/16 0320)  diazepam (VALIUM) tablet 5 mg (5 mg Oral Given 08/09/16 0320)  meclizine (ANTIVERT) tablet 25 mg (25 mg Oral Given 08/09/16 0320)  prochlorperazine (COMPAZINE) injection 10 mg (10 mg Intravenous Given 08/09/16 0624)  sodium chloride 0.9 % bolus  1,000 mL (1,000 mLs Intravenous New Bag/Given 08/09/16 0624)  diphenhydrAMINE (BENADRYL) injection 25 mg ( Intravenous Given 08/09/16 0624)  dexamethasone (DECADRON) injection 10 mg (10 mg Intravenous Given 08/09/16 1610)     Initial Impression / Assessment and Plan / ED Course  I have reviewed the triage vital signs and the nursing notes.  Pertinent labs & imaging results that were available during my care of the patient were reviewed by me and considered in my medical decision making (see chart for details).  Clinical Course as of Aug 10 702  Tue Aug 09, 2016  0409 Patient reports marked improvement with treatment. Still reports some dizziness but is now able to open his eyes and participate fully in neuro exam.  [CH]  0535 Attempted to ambulate patient; however, had severe vertiginous symptoms upon standing. Will premedicate with Compazine, Benadryl, Decadron, and more fluids.  [CH]    Clinical Course User Index [CH] Shon Baton, MD    Patient initially difficult to assess fully from a neuro exam standpoint as he did not tolerate keeping his eyes  open secondary to dizziness. He was medicated and subsequent neurologic exam is reassuring without evidence of cerebellar dysfunction. Relates his symptoms tend known Mnire's disease. See clinical course above.  7:04 AM Patient improved with second ring medication. He ambulatory without difficulty. Patient provided ENT follow-up. Provided prescription for Valium and Zofran.  After history, exam, and medical workup I feel the patient has been appropriately medically screened and is safe for discharge home. Pertinent diagnoses were discussed with the patient. Patient was given return precautions.   Final Clinical Impressions(s) / ED Diagnoses   Final diagnoses:  Meniere disease, left    New Prescriptions New Prescriptions   DIAZEPAM (VALIUM) 5 MG TABLET    Take 1 tablet (5 mg total) by mouth every 12 (twelve) hours as needed (dizziness).   ONDANSETRON (ZOFRAN ODT) 4 MG DISINTEGRATING TABLET    Take 1 tablet (4 mg total) by mouth every 8 (eight) hours as needed for nausea or vomiting.   I personally performed the services described in this documentation, which was scribed in my presence. The recorded information has been reviewed and is accurate.    Shon Baton, MD 08/09/16 [phone]

## 2016-08-09 NOTE — ED Triage Notes (Signed)
Pt c/o dizziness denies LOC, onset alittle after midnight no deficits noted at this time stroke screen per EMS was negative IV site L hand and 4mg  zofran given per EMS. VS 132/81 hr 74 nsr RESP 16  Sat 96% RA [id number]

## 2016-08-09 NOTE — ED Notes (Signed)
Bed: [id number] Expected date:  Expected time:  Means of arrival:  Comments: 61 yo M/Vertigo

## 2017-07-06 ENCOUNTER — Emergency Department (HOSPITAL_COMMUNITY): Payer: 59

## 2017-07-06 ENCOUNTER — Emergency Department (HOSPITAL_COMMUNITY)
Admission: EM | Admit: 2017-07-06 | Discharge: 2017-07-06 | Disposition: A | Discharge status: Home / Self Care | Payer: 59 | Attending: Emergency Medicine | Admitting: Emergency Medicine

## 2017-07-06 ENCOUNTER — Encounter (HOSPITAL_COMMUNITY): Payer: Self-pay | Admitting: Emergency Medicine

## 2017-07-06 DIAGNOSIS — E119 Type 2 diabetes mellitus without complications: Secondary | ICD-10-CM | POA: Diagnosis not present

## 2017-07-06 DIAGNOSIS — Z7982 Long term (current) use of aspirin: Secondary | ICD-10-CM | POA: Diagnosis not present

## 2017-07-06 DIAGNOSIS — R0789 Other chest pain: Secondary | ICD-10-CM | POA: Insufficient documentation

## 2017-07-06 DIAGNOSIS — R0781 Pleurodynia: Secondary | ICD-10-CM

## 2017-07-06 DIAGNOSIS — Z79899 Other long term (current) drug therapy: Secondary | ICD-10-CM | POA: Diagnosis not present

## 2017-07-06 DIAGNOSIS — I10 Essential (primary) hypertension: Secondary | ICD-10-CM | POA: Insufficient documentation

## 2017-07-06 DIAGNOSIS — Z7984 Long term (current) use of oral hypoglycemic drugs: Secondary | ICD-10-CM | POA: Diagnosis not present

## 2017-07-06 DIAGNOSIS — W01190A Fall on same level from slipping, tripping and stumbling with subsequent striking against furniture, initial encounter: Secondary | ICD-10-CM | POA: Insufficient documentation

## 2017-07-06 DIAGNOSIS — R079 Chest pain, unspecified: Secondary | ICD-10-CM | POA: Diagnosis present

## 2017-07-06 MED ORDER — HYDROCODONE-ACETAMINOPHEN 5-325 MG PO TABS: 1.0000 | ORAL_TABLET | ORAL | 0 refills | Status: DC | PRN

## 2017-07-06 NOTE — ED Triage Notes (Signed)
Patient reports Monday night his knee gave out on him causing him to fall onto a table. Patient having left sided rib cage since. Worsening pain after work today lifting and moving things. Pain worse with movement and laughing. No breathing issues.

## 2017-07-08 NOTE — ED Provider Notes (Signed)
[facility]-EMERGENCY DEPT Provider Note   CSN: [id number] Arrival date & time: 07/06/17  1156     History   Chief Complaint Chief Complaint  Patient presents with  . Rib Injury    HPI Kenneth Simplerlfonso, Squyres is a 62 y.o. male.  HPI   62 year old male with left anterior to left lateral chest pain.  Patient fell on Monday night and struck this area against the side of a table.  He has had persistent pain since then.  Worse with movement, deep breathing and coughing.  He does not feel short of breath.  He has not tried taking anything for his pain.  Past Medical History:  Diagnosis Date  . Anemia   . Arthritis   . Diabetes mellitus without complication (HCC)   . Hypertension     There are no active problems to display for this patient.   History reviewed. No pertinent surgical history.     Home Medications    Prior to Admission medications   Medication Sig Start Date End Date Taking? Authorizing Provider  allopurinol (ZYLOPRIM) 300 MG tablet Take 300 mg by mouth daily.    [provider]  amLODipine (NORVASC) 10 MG tablet Take 10 mg by mouth daily.    [provider]  aspirin EC 81 MG tablet Take 81 mg by mouth daily.    [provider]  atorvastatin (LIPITOR) 80 MG tablet Take 80 mg by mouth daily at 6 PM.    [provider]  diazepam (VALIUM) 5 MG tablet Take 1 tablet (5 mg total) by mouth every 12 (twelve) hours as needed (dizziness). 08/09/16   Horton, Mayer Maskerourtney F, MD  fluocinonide-emollient (LIDEX-E) 0.05 % cream Apply 1 application topically 2 (two) times daily. Patient not taking: Reported on 08/09/2016 02/28/15   Elvina SidleLauenstein, Kurt, MD  HYDROcodone-acetaminophen (NORCO/VICODIN) 5-325 MG tablet Take 1 tablet by mouth every 4 (four) hours as needed for severe pain. 07/06/17   Raeford RazorKohut, Kenneth Craighead, MD  meclizine (ANTIVERT) 25 MG tablet Take 25 mg by mouth daily as needed for dizziness.    [provider]  naproxen  (NAPROSYN) 500 MG tablet Take 500 mg by mouth daily.    [provider]  olmesartan (BENICAR) 40 MG tablet Take 20 mg by mouth daily.    [provider]  ondansetron (ZOFRAN ODT) 4 MG disintegrating tablet Take 1 tablet (4 mg total) by mouth every 8 (eight) hours as needed for nausea or vomiting. 08/09/16   Horton, Mayer Maskerourtney F, MD  sitaGLIPtin-metformin (JANUMET) 50-500 MG tablet Take 0.5 tablets by mouth 2 (two) times daily.    [provider]  tamsulosin (FLOMAX) 0.4 MG CAPS capsule Take 1 capsule (0.4 mg total) by mouth daily. Patient not taking: Reported on 08/09/2016 04/21/15   Lenell AntuLe, Thao P, DO    Family History Family History  Problem Relation Age of Onset  . Hypertension Mother   . Mental illness Mother   . Diabetes Father   . Heart disease Father   . Hypertension Father   . Stroke Father     Social History Social History   Tobacco Use  . Smoking status: Never Smoker  . Smokeless tobacco: Never Used  Substance Use Topics  . Alcohol use: Yes    Alcohol/week: 0.0 oz    Comment: occasionally  . Drug use: No     Allergies   Patient has no known allergies.   Review of Systems Review of Systems   Physical Exam Updated Vital  Signs BP (!) 137/97 (BP Location: Right Arm)   Pulse 94   Temp 98 F (36.7 C) (Oral)   SpO2 95%   Physical Exam  Constitutional: He appears well-developed and well-nourished. No distress.  HENT:  Head: Normocephalic and atraumatic.  Eyes: Conjunctivae are normal. Right eye exhibits no discharge. Left eye exhibits no discharge.  Neck: Neck supple.  Cardiovascular: Normal rate, regular rhythm and normal heart sounds. Exam reveals no gallop and no friction rub.  No murmur heard. Pulmonary/Chest: Effort normal and breath sounds normal. No respiratory distress. He exhibits tenderness.  Tenderness to palpation left chest wall around the midclavicular to anterior axillary line fifth to seventh ribs.  No crepitus.  No  overlying skin changes.  Breath sounds are clear and symmetric bilaterally.  Abdominal: Soft. He exhibits no distension. There is no tenderness.  Musculoskeletal: He exhibits no edema or tenderness.  Neurological: He is alert.  Skin: Skin is warm and dry.  Psychiatric: He has a normal mood and affect. His behavior is normal. Thought content normal.  Nursing note and vitals reviewed.    ED Treatments / Results  Labs (all labs ordered are listed, but only abnormal results are displayed) Labs Reviewed - No data to display  EKG  EKG Interpretation None       Radiology No results found.   Dg Ribs Unilateral W/chest Left  Result Date: 07/06/2017 CLINICAL DATA:  Anterior left rib pain after work injury 4 days ago. EXAM: LEFT RIBS AND CHEST - 3+ VIEW COMPARISON:  None. FINDINGS: No fracture or other bone lesions are seen involving the ribs. There is no evidence of pneumothorax or pleural effusion. Both lungs are clear. Heart size and mediastinal contours are within normal limits. IMPRESSION: Negative. Electronically Signed   By: Obie Dredge M.D.   On: 07/06/2017 14:21    Procedures Procedures (including critical care time)  Medications Ordered in ED Medications - No data to display   Initial Impression / Assessment and Plan / ED Course  I have reviewed the triage vital signs and the nursing notes.  Pertinent labs & imaging results that were available during my care of the patient were reviewed by me and considered in my medical decision making (see chart for details).     62 year old male with chest wall pain as striking this area against a table.  Imaging without acute abnormality.  Plan symptomatic treatment for chest wall contusion.  Is in no respiratory distress and lungs sound clear.  Final Clinical Impressions(s) / ED Diagnoses   Final diagnoses:  Rib pain on left side    ED Discharge Orders        Ordered    HYDROcodone-acetaminophen (NORCO/VICODIN) 5-325 MG  tablet  Every 4 hours PRN     07/06/17 1448       Raeford Razor, MD 07/08/17 1535

## 2017-08-22 ENCOUNTER — Emergency Department (HOSPITAL_COMMUNITY)
Admission: EM | Admit: 2017-08-22 | Discharge: 2017-08-22 | Disposition: A | Discharge status: Home / Self Care | Payer: 59 | Attending: Emergency Medicine | Admitting: Emergency Medicine

## 2017-08-22 ENCOUNTER — Encounter (HOSPITAL_COMMUNITY): Payer: Self-pay | Admitting: Nurse Practitioner

## 2017-08-22 ENCOUNTER — Other Ambulatory Visit: Payer: Self-pay

## 2017-08-22 ENCOUNTER — Emergency Department (HOSPITAL_COMMUNITY): Payer: 59

## 2017-08-22 DIAGNOSIS — I10 Essential (primary) hypertension: Secondary | ICD-10-CM | POA: Insufficient documentation

## 2017-08-22 DIAGNOSIS — Z7984 Long term (current) use of oral hypoglycemic drugs: Secondary | ICD-10-CM | POA: Diagnosis not present

## 2017-08-22 DIAGNOSIS — Z7982 Long term (current) use of aspirin: Secondary | ICD-10-CM | POA: Diagnosis not present

## 2017-08-22 DIAGNOSIS — R112 Nausea with vomiting, unspecified: Secondary | ICD-10-CM | POA: Diagnosis not present

## 2017-08-22 DIAGNOSIS — E119 Type 2 diabetes mellitus without complications: Secondary | ICD-10-CM | POA: Insufficient documentation

## 2017-08-22 DIAGNOSIS — Z79899 Other long term (current) drug therapy: Secondary | ICD-10-CM | POA: Diagnosis not present

## 2017-08-22 DIAGNOSIS — R109 Unspecified abdominal pain: Secondary | ICD-10-CM

## 2017-08-22 LAB — CBC
HEMATOCRIT: 46.5 % (ref 39.0–52.0)
HEMOGLOBIN: 15.6 g/dL (ref 13.0–17.0)
MCH: 33.8 pg (ref 26.0–34.0)
MCHC: 33.5 g/dL (ref 30.0–36.0)
MCV: 100.9 fL — ABNORMAL HIGH (ref 78.0–100.0)
Platelets: 196 10*3/uL (ref 150–400)
RBC: 4.61 MIL/uL (ref 4.22–5.81)
RDW: 14.1 % (ref 11.5–15.5)
WBC: 7.4 10*3/uL (ref 4.0–10.5)

## 2017-08-22 LAB — URINALYSIS, ROUTINE W REFLEX MICROSCOPIC
Bilirubin Urine: NEGATIVE
Glucose, UA: NEGATIVE mg/dL
Hgb urine dipstick: NEGATIVE
Ketones, ur: NEGATIVE mg/dL
LEUKOCYTES UA: NEGATIVE
NITRITE: NEGATIVE
PH: 5 (ref 5.0–8.0)
Protein, ur: NEGATIVE mg/dL
SPECIFIC GRAVITY, URINE: 1.029 (ref 1.005–1.030)

## 2017-08-22 LAB — BASIC METABOLIC PANEL
ANION GAP: 12 (ref 5–15)
BUN: 22 mg/dL — ABNORMAL HIGH (ref 6–20)
CO2: 25 mmol/L (ref 22–32)
Calcium: 9.6 mg/dL (ref 8.9–10.3)
Chloride: 104 mmol/L (ref 101–111)
Creatinine, Ser: 1.11 mg/dL (ref 0.61–1.24)
GFR calc Af Amer: >60 mL/min (ref >60)
Glucose, Bld: 136 mg/dL — ABNORMAL HIGH (ref 65–99)
POTASSIUM: 3.9 mmol/L (ref 3.5–5.1)
SODIUM: 141 mmol/L (ref 135–145)

## 2017-08-22 MED ORDER — HYDROCODONE-ACETAMINOPHEN 5-325 MG PO TABS
2.0000 | ORAL_TABLET | Freq: Once | ORAL | Status: AC
  Administered 2017-08-22: 2 via ORAL
  Filled 2017-08-22: qty 2

## 2017-08-22 MED ORDER — METHOCARBAMOL 500 MG PO TABS: 500.0000 mg | ORAL_TABLET | Freq: Two times a day (BID) | ORAL | 0 refills | Status: DC

## 2017-08-22 MED ORDER — ONDANSETRON HCL 4 MG/2ML IJ SOLN
4.0000 mg | Freq: Once | INTRAMUSCULAR | Status: AC
  Administered 2017-08-22: 4 mg via INTRAVENOUS
  Filled 2017-08-22: qty 2

## 2017-08-22 MED ORDER — KETOROLAC TROMETHAMINE 30 MG/ML IJ SOLN
30.0000 mg | Freq: Once | INTRAMUSCULAR | Status: AC
  Administered 2017-08-22: 30 mg via INTRAVENOUS
  Filled 2017-08-22: qty 1

## 2017-08-22 MED ORDER — LIDOCAINE 5 % EX PTCH: 1.0000 | MEDICATED_PATCH | CUTANEOUS | 0 refills | Status: DC

## 2017-08-22 MED ORDER — SODIUM CHLORIDE 0.9 % IV BOLUS
1000.0000 mL | Freq: Once | INTRAVENOUS | Status: AC
  Administered 2017-08-22: 1000 mL via INTRAVENOUS

## 2017-08-22 MED ORDER — HYDROCODONE-ACETAMINOPHEN 5-325 MG PO TABS: 1.0000 | ORAL_TABLET | ORAL | 0 refills | Status: DC | PRN

## 2017-08-22 NOTE — ED Notes (Signed)
Pt updated about his wait time and that we are working to get him back to a room. He verbalizes understanding.

## 2017-08-22 NOTE — ED Provider Notes (Signed)
[facility]-EMERGENCY DEPT Provider Note   CSN: [id number] Arrival date & time: 08/22/17  1140     History   Chief Complaint Chief Complaint  Patient presents with  . flank pain left side    HPI Kenneth Joseph, Kenneth Joseph is a 62 y.o. male.  HPI   Kenneth Joseph, Kenneth Joseph is a 62 y.o. male, with a history of DM and HTN, presenting to the ED with left flank pain for the last week.  Pain was initially intermittent, but has been constant since around 10 PM last night.  Pain is sharp/stabbing, 10/10, nonradiating.  He also began vomiting last night.  Currently nauseous.  States past renal stone pain has been in his back. Pain is worse with movement and palpation. Pain is tolerable when he is standing or sitting, but worse with lying down or twisting. Denies fever/chills, diarrhea, hematochezia/melena, abdominal pain, hematemesis, hematuria/dysuria, cough, shortness of breath, chest pain, or any other complaints.    Past Medical History:  Diagnosis Date  . Anemia   . Arthritis   . Diabetes mellitus without complication (HCC)   . Hypertension     There are no active problems to display for this patient.   History reviewed. No pertinent surgical history.      Home Medications    Prior to Admission medications   Medication Sig Start Date End Date Taking? Authorizing Provider  amLODipine-olmesartan (AZOR) 10-40 MG tablet Take 1 tablet by mouth daily.   Yes [provider]  aspirin EC 81 MG tablet Take 81 mg by mouth daily.   Yes [provider]  atorvastatin (LIPITOR) 80 MG tablet Take 80 mg by mouth daily at 6 PM.   Yes [provider]  HYDROcodone-acetaminophen (NORCO/VICODIN) 5-325 MG tablet Take 1 tablet by mouth every 4 (four) hours as needed for severe pain. 07/06/17  Yes Raeford RazorKohut, Stephen, MD  meloxicam (MOBIC) 15 MG tablet Take 15 mg by mouth daily.   Yes [provider]  naproxen (NAPROSYN) 500 MG tablet Take 500 mg by mouth daily.    Yes [provider]  olmesartan (BENICAR) 40 MG tablet Take 20 mg by mouth daily.   Yes [provider]  sitaGLIPtin-metformin (JANUMET) 50-500 MG tablet Take 0.5 tablets by mouth 2 (two) times daily.   Yes [provider]  diazepam (VALIUM) 5 MG tablet Take 1 tablet (5 mg total) by mouth every 12 (twelve) hours as needed (dizziness). Patient not taking: Reported on 08/22/2017 08/09/16   Horton, Mayer Maskerourtney F, MD  fluocinonide-emollient (LIDEX-E) 0.05 % cream Apply 1 application topically 2 (two) times daily. Patient not taking: Reported on 08/09/2016 02/28/15   Elvina SidleLauenstein, Kurt, MD  HYDROcodone-acetaminophen (NORCO/VICODIN) 5-325 MG tablet Take 1-2 tablets by mouth every 4 (four) hours as needed for severe pain. 08/22/17   Saul Dorsi C, PA-C  lidocaine (LIDODERM) 5 % Place 1 patch onto the skin daily. Remove & Discard patch within 12 hours or as directed by MD 08/22/17   Ival Pacer C, PA-C  methocarbamol (ROBAXIN) 500 MG tablet Take 1 tablet (500 mg total) by mouth 2 (two) times daily. 08/22/17   Izella Ybanez C, PA-C  tamsulosin (FLOMAX) 0.4 MG CAPS capsule Take 1 capsule (0.4 mg total) by mouth daily. Patient not taking: Reported on 08/22/2017 04/21/15   Lenell AntuLe, Thao P, DO    Family History Family History  Problem Relation Age of Onset  . Hypertension Mother   . Mental illness Mother   . Diabetes Father   .  Heart disease Father   . Hypertension Father   . Stroke Father     Social History Social History   Tobacco Use  . Smoking status: Never Smoker  . Smokeless tobacco: Never Used  Substance Use Topics  . Alcohol use: Yes    Alcohol/week: 0.0 oz    Comment: occasionally  . Drug use: No     Allergies   Patient has no known allergies.   Review of Systems Review of Systems  Constitutional: Negative for chills and fever.  Respiratory: Negative for shortness of breath.   Gastrointestinal: Positive for nausea and vomiting. Negative for abdominal pain, blood in  stool and diarrhea.  Genitourinary: Positive for flank pain. Negative for difficulty urinating, dysuria and hematuria.  All other systems reviewed and are negative.    Physical Exam Updated Vital Signs BP (!) 142/85 (BP Location: Left Arm)   Pulse 86   Temp 98.8 F (37.1 C) (Oral)   Resp 16   Ht 5\' 11"  (1.803 m)   Wt 77.6 kg (171 lb)   SpO2 97%   BMI 23.85 kg/m   Physical Exam  Constitutional: He appears well-developed and well-nourished. No distress.  HENT:  Head: Normocephalic and atraumatic.  Eyes: Conjunctivae are normal.  Neck: Neck supple.  Cardiovascular: Normal rate, regular rhythm, normal heart sounds and intact distal pulses.  Pulmonary/Chest: Effort normal and breath sounds normal. No respiratory distress.  Abdominal: Soft. There is no tenderness. There is no guarding and no CVA tenderness.  Musculoskeletal: He exhibits no edema.       Arms: Lymphadenopathy:    He has no cervical adenopathy.  Neurological: He is alert.  Skin: Skin is warm and dry. He is not diaphoretic.  No erythema, swelling, or skin lesions to the left side of the trunk.  Psychiatric: He has a normal mood and affect. His behavior is normal.  Nursing note and vitals reviewed.    ED Treatments / Results  Labs (all labs ordered are listed, but only abnormal results are displayed) Labs Reviewed  BASIC METABOLIC PANEL - Abnormal; Notable for the following components:      Result Value   Glucose, Bld 136 (*)    BUN 22 (*)    All other components within normal limits  CBC - Abnormal; Notable for the following components:   MCV 100.9 (*)    All other components within normal limits  URINALYSIS, ROUTINE W REFLEX MICROSCOPIC    EKG None  Radiology Ct Renal Stone Study  Result Date: 08/22/2017 CLINICAL DATA:  Left flank pain.  History of kidney stones. EXAM: CT ABDOMEN AND PELVIS WITHOUT CONTRAST TECHNIQUE: Multidetector CT imaging of the abdomen and pelvis was performed following the  standard protocol without IV contrast. COMPARISON:  04/22/2015 FINDINGS: Lower chest: Mild subpleural opacities in the lung bases favored to represent atelectasis. No pleural effusion. Hepatobiliary: No focal liver abnormality is seen. No gallstones, gallbladder wall thickening, or biliary dilatation. Pancreas: Unremarkable. Spleen: Unremarkable. Adrenals/Urinary Tract: Unremarkable adrenal glands. Decreased left renal stone burden, with a residual upper pole stone measuring 2 mm. Two new right renal calculi measure approximately 3 mm each. No hydronephrosis. No ureteral calculi or ureteral dilatation. Unremarkable bladder. Stomach/Bowel: The stomach is within normal limits. There is no evidence of bowel obstruction. Descending colon diverticulosis is noted without evidence of diverticulitis. The appendix is unremarkable aside from a punctate calcification. Vascular/Lymphatic: Abdominal aortic atherosclerosis without aneurysm. No enlarged lymph nodes. Reproductive: Slightly enlarged prostate. Other: No intraperitoneal free fluid.  No abdominal wall hernia. Musculoskeletal: Advanced lower lumbar facet arthrosis. IMPRESSION: 1. No acute abnormality identified in the abdomen or pelvis. 2. Decreased left renal stone burden. New small right renal calculi. No hydronephrosis. 3.  Aortic Atherosclerosis (ICD10-I70.0). Electronically Signed   By: Sebastian Ache M.D.   On: 08/22/2017 19:33    Procedures Procedures (including critical care time)  Medications Ordered in ED Medications  HYDROcodone-acetaminophen (NORCO/VICODIN) 5-325 MG per tablet 2 tablet (has no administration in time range)  ketorolac (TORADOL) 30 MG/ML injection 30 mg (30 mg Intravenous Given 08/22/17 1851)  ondansetron (ZOFRAN) injection 4 mg (4 mg Intravenous Given 08/22/17 1851)  sodium chloride 0.9 % bolus 1,000 mL (1,000 mLs Intravenous New Bag/Given 08/22/17 1851)     Initial Impression / Assessment and Plan / ED Course  I have reviewed the  triage vital signs and the nursing notes.  Pertinent labs & imaging results that were available during my care of the patient were reviewed by me and considered in my medical decision making (see chart for details).     Patient presents with pain to his left side.  Reproducible on palpation. Patient is nontoxic appearing, afebrile, not tachycardic, not tachypneic, not hypotensive, maintains excellent SPO2 on room air, and is in no apparent distress.  Patient's pain has many features of musculoskeletal pain, but may also be radicular pain.  No acute abnormality on CT scan, however, spinal degenerative changes were noted.  Lab results rather unremarkable.  PCP follow-up. The patient was given instructions for home care as well as return precautions. Patient voices understanding of these instructions, accepts the plan, and is comfortable with discharge.   Findings and plan of care discussed with Mancel Bale, MD.   Vitals:   08/22/17 1222 08/22/17 1223 08/22/17 1723 08/22/17 2012  BP: 135/86  (!) 142/85 (!) 149/90  Pulse: 88  86 87  Resp: 18  16 16   Temp: 98.8 F (37.1 C)   (!) 97.5 F (36.4 C)  TempSrc: Oral   Oral  SpO2: 98%  97% 96%  Weight:  77.6 kg (171 lb)    Height:  5\' 11"  (1.803 m)        Final Clinical Impressions(s) / ED Diagnoses   Final diagnoses:  Left flank pain    ED Discharge Orders        Ordered    HYDROcodone-acetaminophen (NORCO/VICODIN) 5-325 MG tablet  Every 4 hours PRN     08/22/17 1958    methocarbamol (ROBAXIN) 500 MG tablet  2 times daily     08/22/17 1958    lidocaine (LIDODERM) 5 %  Every 24 hours     08/22/17 1958       Concepcion Living 08/22/17 2035    Mancel Bale, MD 08/23/17 [phone]

## 2017-08-22 NOTE — Discharge Instructions (Addendum)
Take it easy, but do not lay around too much as this may make any stiffness worse.  Antiinflammatory medications: Take 600 mg of ibuprofen every 6 hours or 440 mg (over the counter dose) to 500 mg (prescription dose) of naproxen every 12 hours for the next 3 days. After this time, these medications may be used as needed for pain. Take these medications with food to avoid upset stomach. Choose only one of these medications, do not take them together.  Tylenol: Should you continue to have additional pain while taking the ibuprofen or naproxen, you may add in tylenol as needed. Your daily total maximum amount of tylenol from all sources should be limited to 4000mg /day for persons without liver problems, or 2000mg /day for those with liver problems. Vicodin: May take Vicodin as needed for severe pain.  Do not drive or perform other dangerous activities while taking the Vicodin.  Please note that each pill of Vicodin contains 325 mg of Tylenol and the above dosage limits apply. Muscle relaxer: Robaxin is a muscle relaxer and may help loosen stiff muscles. Do not take the Robaxin while driving or performing other dangerous activities.  Lidocaine patches: These are available via either prescription or over-the-counter. The over-the-counter option may be more economical one and are likely just as effective. There are multiple over-the-counter brands, such as Salonpas. Exercises: Be sure to perform the attached exercises starting with three times a week and working up to performing them daily. This is an essential part of preventing long term problems.   Follow up with a primary care provider for any future management of these complaints.

## 2017-08-22 NOTE — ED Triage Notes (Signed)
Patient came in ER for left sided flank pain. Patient had 3 stones a year ago and past 2 of them but one was 4 mm and was too large to pass but didn't give him problems. Patient also has been able to void and denies blood in urine. Patient also fell on his left side about 6 weeks ago and had rib pain x-rays were fine sent home with pain medications.

## 2018-03-08 ENCOUNTER — Other Ambulatory Visit: Payer: Self-pay | Admitting: Gastroenterology

## 2018-03-08 DIAGNOSIS — R1032 Left lower quadrant pain: Secondary | ICD-10-CM

## 2018-03-14 ENCOUNTER — Ambulatory Visit
Admission: RE | Admit: 2018-03-14 | Discharge: 2018-03-14 | Disposition: A | Discharge status: Home / Self Care | Payer: 59 | Source: Ambulatory Visit | Attending: Gastroenterology | Admitting: Gastroenterology

## 2018-03-14 DIAGNOSIS — R1032 Left lower quadrant pain: Secondary | ICD-10-CM

## 2018-03-14 MED ORDER — IOPAMIDOL (ISOVUE-300) INJECTION 61%
100.0000 mL | Freq: Once | INTRAVENOUS | Status: AC | PRN
  Administered 2018-03-14: 100 mL via INTRAVENOUS

## 2020-02-04 DIAGNOSIS — E538 Deficiency of other specified B group vitamins: Secondary | ICD-10-CM | POA: Diagnosis not present

## 2020-02-18 DIAGNOSIS — E538 Deficiency of other specified B group vitamins: Secondary | ICD-10-CM | POA: Diagnosis not present

## 2020-03-03 DIAGNOSIS — E538 Deficiency of other specified B group vitamins: Secondary | ICD-10-CM | POA: Diagnosis not present

## 2020-04-06 DIAGNOSIS — E538 Deficiency of other specified B group vitamins: Secondary | ICD-10-CM | POA: Diagnosis not present

## 2020-05-04 DIAGNOSIS — E119 Type 2 diabetes mellitus without complications: Secondary | ICD-10-CM | POA: Diagnosis not present

## 2020-05-04 DIAGNOSIS — H40013 Open angle with borderline findings, low risk, bilateral: Secondary | ICD-10-CM | POA: Diagnosis not present

## 2020-05-06 DIAGNOSIS — E538 Deficiency of other specified B group vitamins: Secondary | ICD-10-CM | POA: Diagnosis not present

## 2020-06-08 DIAGNOSIS — E7800 Pure hypercholesterolemia, unspecified: Secondary | ICD-10-CM | POA: Diagnosis not present

## 2020-06-08 DIAGNOSIS — E1169 Type 2 diabetes mellitus with other specified complication: Secondary | ICD-10-CM | POA: Diagnosis not present

## 2020-06-08 DIAGNOSIS — E538 Deficiency of other specified B group vitamins: Secondary | ICD-10-CM | POA: Diagnosis not present

## 2020-06-08 DIAGNOSIS — I10 Essential (primary) hypertension: Secondary | ICD-10-CM | POA: Diagnosis not present

## 2020-07-01 DIAGNOSIS — E785 Hyperlipidemia, unspecified: Secondary | ICD-10-CM | POA: Diagnosis not present

## 2020-07-01 DIAGNOSIS — R801 Persistent proteinuria, unspecified: Secondary | ICD-10-CM | POA: Diagnosis not present

## 2020-07-01 DIAGNOSIS — M1000 Idiopathic gout, unspecified site: Secondary | ICD-10-CM | POA: Diagnosis not present

## 2020-07-01 DIAGNOSIS — N1831 Chronic kidney disease, stage 3a: Secondary | ICD-10-CM | POA: Diagnosis not present

## 2020-07-01 DIAGNOSIS — E1122 Type 2 diabetes mellitus with diabetic chronic kidney disease: Secondary | ICD-10-CM | POA: Diagnosis not present

## 2020-07-01 DIAGNOSIS — I129 Hypertensive chronic kidney disease with stage 1 through stage 4 chronic kidney disease, or unspecified chronic kidney disease: Secondary | ICD-10-CM | POA: Diagnosis not present

## 2020-07-01 DIAGNOSIS — N200 Calculus of kidney: Secondary | ICD-10-CM | POA: Diagnosis not present

## 2020-07-09 DIAGNOSIS — E538 Deficiency of other specified B group vitamins: Secondary | ICD-10-CM | POA: Diagnosis not present

## 2020-08-10 DIAGNOSIS — E538 Deficiency of other specified B group vitamins: Secondary | ICD-10-CM | POA: Diagnosis not present

## 2020-09-07 DIAGNOSIS — E538 Deficiency of other specified B group vitamins: Secondary | ICD-10-CM | POA: Diagnosis not present

## 2020-09-30 DIAGNOSIS — R591 Generalized enlarged lymph nodes: Secondary | ICD-10-CM | POA: Diagnosis not present

## 2020-09-30 DIAGNOSIS — E538 Deficiency of other specified B group vitamins: Secondary | ICD-10-CM | POA: Diagnosis not present

## 2020-09-30 DIAGNOSIS — E1169 Type 2 diabetes mellitus with other specified complication: Secondary | ICD-10-CM | POA: Diagnosis not present

## 2020-09-30 DIAGNOSIS — Z7984 Long term (current) use of oral hypoglycemic drugs: Secondary | ICD-10-CM | POA: Diagnosis not present

## 2020-11-03 DIAGNOSIS — E538 Deficiency of other specified B group vitamins: Secondary | ICD-10-CM | POA: Diagnosis not present

## 2020-11-09 ENCOUNTER — Other Ambulatory Visit: Payer: Self-pay | Admitting: Family Medicine

## 2020-11-09 DIAGNOSIS — R591 Generalized enlarged lymph nodes: Secondary | ICD-10-CM

## 2020-12-16 DIAGNOSIS — E1169 Type 2 diabetes mellitus with other specified complication: Secondary | ICD-10-CM | POA: Diagnosis not present

## 2020-12-16 DIAGNOSIS — I10 Essential (primary) hypertension: Secondary | ICD-10-CM | POA: Diagnosis not present

## 2020-12-16 DIAGNOSIS — E7800 Pure hypercholesterolemia, unspecified: Secondary | ICD-10-CM | POA: Diagnosis not present

## 2020-12-16 DIAGNOSIS — E538 Deficiency of other specified B group vitamins: Secondary | ICD-10-CM | POA: Diagnosis not present

## 2020-12-16 DIAGNOSIS — Z23 Encounter for immunization: Secondary | ICD-10-CM | POA: Diagnosis not present

## 2020-12-16 DIAGNOSIS — Z0000 Encounter for general adult medical examination without abnormal findings: Secondary | ICD-10-CM | POA: Diagnosis not present

## 2020-12-16 DIAGNOSIS — Z125 Encounter for screening for malignant neoplasm of prostate: Secondary | ICD-10-CM | POA: Diagnosis not present

## 2020-12-16 DIAGNOSIS — Z1389 Encounter for screening for other disorder: Secondary | ICD-10-CM | POA: Diagnosis not present

## 2021-01-18 DIAGNOSIS — E538 Deficiency of other specified B group vitamins: Secondary | ICD-10-CM | POA: Diagnosis not present

## 2021-02-25 DIAGNOSIS — E538 Deficiency of other specified B group vitamins: Secondary | ICD-10-CM | POA: Diagnosis not present

## 2021-03-25 DIAGNOSIS — E538 Deficiency of other specified B group vitamins: Secondary | ICD-10-CM | POA: Diagnosis not present

## 2021-04-26 DIAGNOSIS — E538 Deficiency of other specified B group vitamins: Secondary | ICD-10-CM | POA: Diagnosis not present

## 2021-05-26 DIAGNOSIS — E538 Deficiency of other specified B group vitamins: Secondary | ICD-10-CM | POA: Diagnosis not present

## 2021-06-23 DIAGNOSIS — R630 Anorexia: Secondary | ICD-10-CM | POA: Diagnosis not present

## 2021-06-23 DIAGNOSIS — R634 Abnormal weight loss: Secondary | ICD-10-CM | POA: Diagnosis not present

## 2021-06-23 DIAGNOSIS — E1169 Type 2 diabetes mellitus with other specified complication: Secondary | ICD-10-CM | POA: Diagnosis not present

## 2021-06-23 DIAGNOSIS — E538 Deficiency of other specified B group vitamins: Secondary | ICD-10-CM | POA: Diagnosis not present

## 2021-06-23 DIAGNOSIS — I10 Essential (primary) hypertension: Secondary | ICD-10-CM | POA: Diagnosis not present

## 2021-06-23 DIAGNOSIS — Z7984 Long term (current) use of oral hypoglycemic drugs: Secondary | ICD-10-CM | POA: Diagnosis not present

## 2021-07-29 DIAGNOSIS — E538 Deficiency of other specified B group vitamins: Secondary | ICD-10-CM | POA: Diagnosis not present

## 2021-08-12 DIAGNOSIS — M6281 Muscle weakness (generalized): Secondary | ICD-10-CM | POA: Diagnosis not present

## 2021-08-12 DIAGNOSIS — G629 Polyneuropathy, unspecified: Secondary | ICD-10-CM | POA: Diagnosis not present

## 2021-08-12 DIAGNOSIS — R630 Anorexia: Secondary | ICD-10-CM | POA: Diagnosis not present

## 2021-08-12 DIAGNOSIS — Z789 Other specified health status: Secondary | ICD-10-CM | POA: Diagnosis not present

## 2021-08-12 DIAGNOSIS — R634 Abnormal weight loss: Secondary | ICD-10-CM | POA: Diagnosis not present

## 2021-08-12 DIAGNOSIS — E538 Deficiency of other specified B group vitamins: Secondary | ICD-10-CM | POA: Diagnosis not present

## 2021-08-12 DIAGNOSIS — M546 Pain in thoracic spine: Secondary | ICD-10-CM | POA: Diagnosis not present

## 2021-08-12 DIAGNOSIS — E1169 Type 2 diabetes mellitus with other specified complication: Secondary | ICD-10-CM | POA: Diagnosis not present

## 2021-08-12 DIAGNOSIS — M542 Cervicalgia: Secondary | ICD-10-CM | POA: Diagnosis not present

## 2021-08-13 ENCOUNTER — Other Ambulatory Visit: Payer: Self-pay

## 2021-08-13 ENCOUNTER — Ambulatory Visit
Admission: RE | Admit: 2021-08-13 | Discharge: 2021-08-13 | Disposition: A | Discharge status: Home / Self Care | Payer: Self-pay | Source: Ambulatory Visit | Attending: Family Medicine | Admitting: Family Medicine

## 2021-08-13 ENCOUNTER — Other Ambulatory Visit: Payer: Self-pay | Admitting: Family Medicine

## 2021-08-13 ENCOUNTER — Encounter: Payer: Self-pay | Admitting: Neurology

## 2021-08-13 DIAGNOSIS — M2578 Osteophyte, vertebrae: Secondary | ICD-10-CM | POA: Diagnosis not present

## 2021-08-13 DIAGNOSIS — M546 Pain in thoracic spine: Secondary | ICD-10-CM

## 2021-08-13 DIAGNOSIS — M542 Cervicalgia: Secondary | ICD-10-CM

## 2021-08-13 DIAGNOSIS — M47812 Spondylosis without myelopathy or radiculopathy, cervical region: Secondary | ICD-10-CM | POA: Diagnosis not present

## 2021-08-13 DIAGNOSIS — J929 Pleural plaque without asbestos: Secondary | ICD-10-CM | POA: Diagnosis not present

## 2021-08-13 DIAGNOSIS — M47814 Spondylosis without myelopathy or radiculopathy, thoracic region: Secondary | ICD-10-CM | POA: Diagnosis not present

## 2021-08-13 DIAGNOSIS — M4312 Spondylolisthesis, cervical region: Secondary | ICD-10-CM | POA: Diagnosis not present

## 2021-08-30 DIAGNOSIS — E538 Deficiency of other specified B group vitamins: Secondary | ICD-10-CM | POA: Diagnosis not present

## 2021-09-17 DIAGNOSIS — M542 Cervicalgia: Secondary | ICD-10-CM | POA: Diagnosis not present

## 2021-09-17 DIAGNOSIS — M546 Pain in thoracic spine: Secondary | ICD-10-CM | POA: Diagnosis not present

## 2021-09-23 DIAGNOSIS — M546 Pain in thoracic spine: Secondary | ICD-10-CM | POA: Diagnosis not present

## 2021-09-23 DIAGNOSIS — M542 Cervicalgia: Secondary | ICD-10-CM | POA: Diagnosis not present

## 2021-09-30 DIAGNOSIS — M546 Pain in thoracic spine: Secondary | ICD-10-CM | POA: Diagnosis not present

## 2021-09-30 DIAGNOSIS — M542 Cervicalgia: Secondary | ICD-10-CM | POA: Diagnosis not present

## 2021-09-30 DIAGNOSIS — E538 Deficiency of other specified B group vitamins: Secondary | ICD-10-CM | POA: Diagnosis not present

## 2021-10-04 ENCOUNTER — Other Ambulatory Visit: Payer: Self-pay | Admitting: Orthopedic Surgery

## 2021-10-07 DIAGNOSIS — M545 Low back pain, unspecified: Secondary | ICD-10-CM | POA: Diagnosis not present

## 2021-10-13 NOTE — Pre-Procedure Instructions (Signed)
Surgical Instructions ? ? ? Your procedure is scheduled on Thursday 10/21/21. ? ? Report to [facility] "A" at 05:30 A.M., then check in with the Admitting office. ? Call this number if you have problems the morning of surgery: ? [phone] ? ? If you have any questions prior to your surgery date call [phone]: Open Monday-Friday 8am-4pm ? ? ? Remember: ? Do not eat after midnight the night before your surgery ? ?You may drink clear liquids until 04:30 A.M. the morning of your surgery.   ?Clear liquids allowed are: Water, Non-Citrus Juices (without pulp), Carbonated Beverages, Clear Tea, Black Coffee ONLY (NO MILK, CREAM OR POWDERED CREAMER of any kind), and Gatorade ? ?Patient Instructions ? ?The night before surgery:  ?No food after midnight. ONLY clear liquids after midnight ? ?The day of surgery (if you have diabetes): ?Drink ONE (1) 12 oz G2 given to you in your pre admission testing appointment by 04:30 A.M. the morning of surgery. Drink in one sitting. Do not sip.  ?This drink was given to you during your hospital  ?pre-op appointment visit.  ?Nothing else to drink after completing the  ?12 oz bottle of G2. ? ?       If you have questions, please contact your surgeon?s office.  ?  ? Take these medicines the morning of surgery with A SIP OF WATER:  ? NONE ? ?As of today, STOP taking any Aspirin (unless otherwise instructed by your surgeon) Aleve, Naproxen, Ibuprofen, Motrin, Advil, Goody's, BC's, all herbal medications, fish oil, meloxicam (MOBIC), and all vitamins. ? ?WHAT DO I DO ABOUT MY DIABETES MEDICATION? ? ? ?Do not take oral diabetes medicines (pills) the morning of surgery. ? ?DO NOT TAKE glipiZIDE-metformin (METAGLIP) the evening before surgery or the morning of surgery.  ? ?The day of surgery, do not take other diabetes injectables, including Byetta (exenatide), Bydureon (exenatide ER), Victoza (liraglutide), or Trulicity (dulaglutide). ? ?HOW TO MANAGE YOUR DIABETES ?BEFORE AND  AFTER SURGERY ? ?Why is it important to control my blood sugar before and after surgery? ?Improving blood sugar levels before and after surgery helps healing and can limit problems. ?A way of improving blood sugar control is eating a healthy diet by: ? Eating less sugar and carbohydrates ? Increasing activity/exercise ? Talking with your doctor about reaching your blood sugar goals ?High blood sugars (greater than 180 mg/dL) can raise your risk of infections and slow your recovery, so you will need to focus on controlling your diabetes during the weeks before surgery. ?Make sure that the doctor who takes care of your diabetes knows about your planned surgery including the date and location. ? ?How do I manage my blood sugar before surgery? ?Check your blood sugar at least 4 times a day, starting 2 days before surgery, to make sure that the level is not too high or low. ? ?Check your blood sugar the morning of your surgery when you wake up and every 2 hours until you get to the Short Stay unit. ? ?If your blood sugar is less than 70 mg/dL, you will need to treat for low blood sugar: ?Do not take insulin. ?Treat a low blood sugar (less than 70 mg/dL) with ? cup of clear juice (cranberry or apple), 4 glucose tablets, OR glucose gel. ?Recheck blood sugar in 15 minutes after treatment (to make sure it is greater than 70 mg/dL). If your blood sugar is not greater than 70 mg/dL on recheck, call [phone] for further instructions. ?  Report your blood sugar to the short stay nurse when you get to Short Stay. ? ?If you are admitted to the hospital after surgery: ?Your blood sugar will be checked by the staff and you will probably be given insulin after surgery (instead of oral diabetes medicines) to make sure you have good blood sugar levels. ?The goal for blood sugar control after surgery is 80-180 mg/dL.  ?         ?Do not wear jewelry or makeup ?Do not wear lotions, powders, perfumes/colognes, or deodorant. ?Do not shave  48 hours prior to surgery.  Men may shave face and neck. ?Do not bring valuables to the hospital. ?Do not wear nail polish, gel polish, artificial nails, or any other type of covering on natural nails (fingers and toes) ?If you have artificial nails or gel coating that need to be removed by a nail salon, please have this removed prior to surgery. Artificial nails or gel coating may interfere with anesthesia's ability to adequately monitor your vital signs. ? ?[facility] is not responsible for any belongings or valuables. .  ? ?Do NOT Smoke (Tobacco/Vaping)  24 hours prior to your procedure ? ?If you use a CPAP at night, you may bring your mask for your overnight stay. ?  ?Contacts, glasses, hearing aids, dentures or partials may not be worn into surgery, please bring cases for these belongings ?  ?For patients admitted to the hospital, discharge time will be determined by your treatment team. ?  ?Patients discharged the day of surgery will not be allowed to drive home, and someone needs to stay with them for 24 hours. ? ? ?SURGICAL WAITING ROOM VISITATION ?Patients having surgery or a procedure in a hospital may have two support people. ?Children under the age of 31 must have an adult with them who is not the patient. ?They may stay in the waiting area during the procedure and may switch out with other visitors. If the patient needs to stay at the hospital during part of their recovery, the visitor guidelines for inpatient rooms apply. ? ?Please refer to the [facility] website for the visitor guidelines for Inpatients (after your surgery is over and you are in a regular room).  ? ? ? ? ? ?Special instructions:   ? ?Oral Hygiene is also important to reduce your risk of infection.  Remember - BRUSH YOUR TEETH THE MORNING OF SURGERY WITH YOUR REGULAR TOOTHPASTE ? ? ?[facility]- Preparing For Surgery ? ?Before surgery, you can play an important role. Because skin is not sterile, your skin needs to be as free of germs  as possible. You can reduce the number of germs on your skin by washing with CHG (chlorahexidine gluconate) Soap before surgery.  CHG is an antiseptic cleaner which kills germs and bonds with the skin to continue killing germs even after washing.   ? ? ?Please do not use if you have an allergy to CHG or antibacterial soaps. If your skin becomes reddened/irritated stop using the CHG.  ?Do not shave (including legs and underarms) for at least 48 hours prior to first CHG shower. It is OK to shave your face. ? ?Please follow these instructions carefully. ?  ? ? Shower the NIGHT BEFORE SURGERY and the MORNING OF SURGERY with CHG Soap.  ? If you chose to wash your hair, wash your hair first as usual with your normal shampoo. After you shampoo, rinse your hair and body thoroughly to remove the shampoo.  Then [employer]  Face and genitals (private parts) with your normal soap and rinse thoroughly to remove soap. ? ?After that Use CHG Soap as you would any other liquid soap. You can apply CHG directly to the skin and wash gently with a scrungie or a clean washcloth.  ? ?Apply the CHG Soap to your body ONLY FROM THE NECK DOWN.  Do not use on open wounds or open sores. Avoid contact with your eyes, ears, mouth and genitals (private parts). Wash Face and genitals (private parts)  with your normal soap.  ? ?Wash thoroughly, paying special attention to the area where your surgery will be performed. ? ?Thoroughly rinse your body with warm water from the neck down. ? ?DO NOT shower/wash with your normal soap after using and rinsing off the CHG Soap. ? ?Pat yourself dry with a CLEAN TOWEL. ? ?Wear CLEAN PAJAMAS to bed the night before surgery ? ?Place CLEAN SHEETS on your bed the night before your surgery ? ?DO NOT SLEEP WITH PETS. ? ? ?Day of Surgery: ? ?Take a shower with CHG soap. ?Wear Clean/Comfortable clothing the morning of surgery ?Do not apply any deodorants/lotions.   ?Remember to brush your teeth WITH YOUR REGULAR  TOOTHPASTE. ? ? ? ?If you received a COVID test during your pre-op visit, it is requested that you wear a mask when out in public, stay away from anyone that may not be feeling well, and notify your surgeon if you d

## 2021-10-14 ENCOUNTER — Encounter (HOSPITAL_COMMUNITY): Payer: Self-pay

## 2021-10-14 ENCOUNTER — Other Ambulatory Visit: Payer: Self-pay

## 2021-10-14 ENCOUNTER — Encounter (HOSPITAL_COMMUNITY)
Admission: RE | Admit: 2021-10-14 | Discharge: 2021-10-14 | Disposition: A | Discharge status: Home / Self Care | Payer: Medicare HMO | Source: Ambulatory Visit | Attending: Orthopedic Surgery | Admitting: Orthopedic Surgery

## 2021-10-14 VITALS — BP 107/87 | HR 94 | Temp 98.0°F | Resp 18 | Ht 70.0 in | Wt 169.3 lb

## 2021-10-14 DIAGNOSIS — Z01818 Encounter for other preprocedural examination: Secondary | ICD-10-CM | POA: Diagnosis not present

## 2021-10-14 DIAGNOSIS — M542 Cervicalgia: Secondary | ICD-10-CM | POA: Diagnosis not present

## 2021-10-14 DIAGNOSIS — E119 Type 2 diabetes mellitus without complications: Secondary | ICD-10-CM | POA: Diagnosis not present

## 2021-10-14 HISTORY — DX: Personal history of urinary calculi: Z87.442

## 2021-10-14 HISTORY — DX: Personal history of other diseases of the digestive system: Z87.19

## 2021-10-14 LAB — BASIC METABOLIC PANEL
Anion gap: 11 (ref 5–15)
BUN: 8 mg/dL (ref 8–23)
CO2: 26 mmol/L (ref 22–32)
Calcium: 8.7 mg/dL — ABNORMAL LOW (ref 8.9–10.3)
Chloride: 105 mmol/L (ref 98–111)
Creatinine, Ser: 1.15 mg/dL (ref 0.61–1.24)
GFR, Estimated: >60 mL/min (ref >60)
Glucose, Bld: 120 mg/dL — ABNORMAL HIGH (ref 70–99)
Potassium: 4.4 mmol/L (ref 3.5–5.1)
Sodium: 142 mmol/L (ref 135–145)

## 2021-10-14 LAB — CBC
HCT: 43.3 % (ref 39.0–52.0)
Hemoglobin: 14.9 g/dL (ref 13.0–17.0)
MCH: 35.9 pg — ABNORMAL HIGH (ref 26.0–34.0)
MCHC: 34.4 g/dL (ref 30.0–36.0)
MCV: 104.3 fL — ABNORMAL HIGH (ref 80.0–100.0)
Platelets: 185 10*3/uL (ref 150–400)
RBC: 4.15 MIL/uL — ABNORMAL LOW (ref 4.22–5.81)
RDW: 14.6 % (ref 11.5–15.5)
WBC: 8 10*3/uL (ref 4.0–10.5)
nRBC: 0 % (ref 0.0–0.2)

## 2021-10-14 LAB — SURGICAL PCR SCREEN
MRSA, PCR: NEGATIVE
Staphylococcus aureus: NEGATIVE

## 2021-10-14 LAB — HEMOGLOBIN A1C
Hgb A1c MFr Bld: 5.6 % (ref 4.8–5.6)
Mean Plasma Glucose: 114.02 mg/dL

## 2021-10-14 LAB — GLUCOSE, CAPILLARY: Glucose-Capillary: 128 mg/dL — ABNORMAL HIGH (ref 70–99)

## 2021-10-14 NOTE — Progress Notes (Signed)
PCP - Candace smith Cardiologist - denies  PPM/ICD - denies   Chest x-ray - n/a EKG - 10/14/21 Stress Test - records requested ECHO - records requested Cardiac Cath - records requested  Sleep Study - denies   Fasting Blood Sugar - 115/130 Checks Blood Sugar couple times a week  Follow your surgeon's instructions on when to stop Aspirin.  If no instructions were given by your surgeon then you will need to call the office to get those instructions.     ERAS Protcol -yes PRE-SURGERY Ensure or G2- g2 give  COVID TEST- not needed   Anesthesia review: yes, review records from cardiologist in [facility]  Patient denies shortness of breath, fever, cough and chest pain at PAT appointment   All instructions explained to the patient, with a verbal understanding of the material. Patient agrees to go over the instructions while at home for a better understanding. Patient also instructed to self quarantine after being tested for COVID-19. The opportunity to ask questions was provided.

## 2021-10-19 DIAGNOSIS — M25561 Pain in right knee: Secondary | ICD-10-CM | POA: Diagnosis not present

## 2021-10-19 NOTE — Anesthesia Preprocedure Evaluation (Addendum)
Anesthesia Evaluation  Patient identified by MRN, date of birth, ID band Patient awake    Reviewed: Allergy & Precautions, NPO status , Patient's Chart, lab work & pertinent test results  History of Anesthesia Complications Negative for: history of anesthetic complications  Airway Mallampati: II  TM Distance: >3 FB Neck ROM: Full    Dental  (+) Dental Advisory Given   Pulmonary neg pulmonary ROS,    breath sounds clear to auscultation       Cardiovascular hypertension, Pt. on medications (-) angina Rhythm:Regular Rate:Normal     Neuro/Psych    GI/Hepatic Neg liver ROS, hiatal hernia,   Endo/Other  diabetes (glu 160), Oral Hypoglycemic Agents  Renal/GU      Musculoskeletal  (+) Arthritis ,   Abdominal   Peds  Hematology negative hematology ROS (+)   Anesthesia Other Findings   Reproductive/Obstetrics                           Anesthesia Physical Anesthesia Plan  ASA: 3  Anesthesia Plan: General   Post-op Pain Management: Tylenol PO (pre-op)*   Induction: Intravenous  PONV Risk Score and Plan: 2 and Ondansetron and Dexamethasone  Airway Management Planned: Oral ETT and Video Laryngoscope Planned  Additional Equipment: None  Intra-op Plan:   Post-operative Plan: Extubation in OR  Informed Consent: I have reviewed the patients History and Physical, chart, labs and discussed the procedure including the risks, benefits and alternatives for the proposed anesthesia with the patient or authorized representative who has indicated his/her understanding and acceptance.     Dental advisory given  Plan Discussed with:   Anesthesia Plan Comments: (PAT note by Antionette Poles, PA-C: Reported history of cardiac cath ~10 yrs ago while living in [place]. Called patient to clarify. He stated that his PCP at that time was also a cardiologist and performed yearly stress testing on patients. He denies  ever having any cardiac complaints, no chest pain, palpitations, or history of cardiac disease. He says one of the yearly stress tests was mildly abnormal and he ultimately had a cath showing normal coronaries. He no longer follows with cardiology since moving to [facility]. Chronic conditions followed by PCP Dr. Merri Brunette.  Multiple attempts made to get records from [facility] via fax and phone with no response.   Non-insulin-dependent DM2 well-controlled, A1c 5.6 on preop labs.  Preop labs reviewed, unremarkable.   EKG 10/14/21: Normal sinus rhythm with sinus arrhythmia. Rate 81. Left axis deviation. Nonspecific T wave abnormality )     Anesthesia Quick Evaluation

## 2021-10-19 NOTE — Progress Notes (Addendum)
Anesthesia Chart Review:  Reported history of cardiac cath ~10 yrs ago while living in [place]. Called patient to clarify. He stated that his PCP at that time was also a cardiologist and performed yearly stress testing on patients. He denies ever having any cardiac complaints, no chest pain, palpitations, or history of cardiac disease. He says one of the yearly stress tests was mildly abnormal and he ultimately had a cath showing normal coronaries. He no longer follows with cardiology since moving to [facility] Chronic conditions followed by PCP Dr. Merri Brunette.  Multiple attempts made to get records from [facility] via fax and phone with no response.   Non-insulin-dependent DM2 well-controlled, A1c 5.6 on preop labs.  Preop labs reviewed, unremarkable.   EKG 10/14/21: Normal sinus rhythm with sinus arrhythmia. Rate 81. Left axis deviation. Nonspecific T wave abnormality    Kenneth Joseph [facility] Short Stay Center/Anesthesiology Phone [phone] 10/19/2021 10:50 AM

## 2021-10-21 ENCOUNTER — Ambulatory Visit (HOSPITAL_COMMUNITY)
Admission: RE | Admit: 2021-10-21 | Discharge: 2021-10-21 | Disposition: A | Discharge status: Home / Self Care | Payer: Medicare HMO | Attending: Orthopedic Surgery | Admitting: Orthopedic Surgery

## 2021-10-21 ENCOUNTER — Ambulatory Visit (HOSPITAL_BASED_OUTPATIENT_CLINIC_OR_DEPARTMENT_OTHER): Payer: Medicare HMO | Admitting: Anesthesiology

## 2021-10-21 ENCOUNTER — Encounter (HOSPITAL_COMMUNITY)
Admission: RE | Disposition: A | Discharge status: Home / Self Care | Payer: Self-pay | Source: Home / Self Care | Attending: Orthopedic Surgery

## 2021-10-21 ENCOUNTER — Ambulatory Visit (HOSPITAL_COMMUNITY): Payer: Medicare HMO

## 2021-10-21 ENCOUNTER — Other Ambulatory Visit: Payer: Self-pay

## 2021-10-21 ENCOUNTER — Ambulatory Visit (HOSPITAL_COMMUNITY): Payer: Medicare HMO | Admitting: Physician Assistant

## 2021-10-21 ENCOUNTER — Encounter (HOSPITAL_COMMUNITY): Payer: Self-pay | Admitting: Orthopedic Surgery

## 2021-10-21 DIAGNOSIS — E119 Type 2 diabetes mellitus without complications: Secondary | ICD-10-CM

## 2021-10-21 DIAGNOSIS — I10 Essential (primary) hypertension: Secondary | ICD-10-CM

## 2021-10-21 DIAGNOSIS — M5000 Cervical disc disorder with myelopathy, unspecified cervical region: Secondary | ICD-10-CM

## 2021-10-21 DIAGNOSIS — M1990 Unspecified osteoarthritis, unspecified site: Secondary | ICD-10-CM | POA: Diagnosis not present

## 2021-10-21 DIAGNOSIS — G9529 Other cord compression: Secondary | ICD-10-CM | POA: Diagnosis not present

## 2021-10-21 DIAGNOSIS — G959 Disease of spinal cord, unspecified: Secondary | ICD-10-CM | POA: Diagnosis not present

## 2021-10-21 DIAGNOSIS — Z7984 Long term (current) use of oral hypoglycemic drugs: Secondary | ICD-10-CM | POA: Insufficient documentation

## 2021-10-21 DIAGNOSIS — M50021 Cervical disc disorder at C4-C5 level with myelopathy: Secondary | ICD-10-CM

## 2021-10-21 DIAGNOSIS — G9520 Unspecified cord compression: Secondary | ICD-10-CM | POA: Diagnosis not present

## 2021-10-21 DIAGNOSIS — Z981 Arthrodesis status: Secondary | ICD-10-CM | POA: Diagnosis not present

## 2021-10-21 DIAGNOSIS — M4322 Fusion of spine, cervical region: Secondary | ICD-10-CM | POA: Diagnosis not present

## 2021-10-21 HISTORY — PX: ANTERIOR CERVICAL DECOMP/DISCECTOMY FUSION: SHX1161

## 2021-10-21 LAB — GLUCOSE, CAPILLARY
Glucose-Capillary: 160 mg/dL — ABNORMAL HIGH (ref 70–99)
Glucose-Capillary: 109 mg/dL — ABNORMAL HIGH (ref 70–99)
Glucose-Capillary: 94 mg/dL (ref 70–99)

## 2021-10-21 SURGERY — ANTERIOR CERVICAL DECOMPRESSION/DISCECTOMY FUSION 1 LEVEL
Anesthesia: General

## 2021-10-21 MED ORDER — ROCURONIUM BROMIDE 10 MG/ML (PF) SYRINGE
PREFILLED_SYRINGE | INTRAVENOUS | Status: AC
  Filled 2021-10-21: qty 10

## 2021-10-21 MED ORDER — PHENYLEPHRINE HCL-NACL 20-0.9 MG/250ML-% IV SOLN
INTRAVENOUS | Status: DC | PRN
  Administered 2021-10-21: 30 ug/min via INTRAVENOUS

## 2021-10-21 MED ORDER — LIDOCAINE 2% (20 MG/ML) 5 ML SYRINGE
INTRAMUSCULAR | Status: DC | PRN
  Administered 2021-10-21: 40 mg via INTRAVENOUS

## 2021-10-21 MED ORDER — FENTANYL CITRATE (PF) 250 MCG/5ML IJ SOLN
INTRAMUSCULAR | Status: AC
  Filled 2021-10-21: qty 5

## 2021-10-21 MED ORDER — FENTANYL CITRATE (PF) 100 MCG/2ML IJ SOLN
INTRAMUSCULAR | Status: DC | PRN
  Administered 2021-10-21: 150 ug via INTRAVENOUS
  Administered 2021-10-21 (×2): 50 ug via INTRAVENOUS

## 2021-10-21 MED ORDER — MEPERIDINE HCL 25 MG/ML IJ SOLN: 6.2500 mg | INTRAMUSCULAR | Status: DC | PRN

## 2021-10-21 MED ORDER — MIDAZOLAM HCL 2 MG/2ML IJ SOLN: 0.5000 mg | Freq: Once | INTRAMUSCULAR | Status: DC | PRN

## 2021-10-21 MED ORDER — EPHEDRINE 5 MG/ML INJ
INTRAVENOUS | Status: AC
  Filled 2021-10-21: qty 5

## 2021-10-21 MED ORDER — OXYCODONE HCL 5 MG PO TABS: 5.0000 mg | ORAL_TABLET | Freq: Once | ORAL | Status: DC | PRN

## 2021-10-21 MED ORDER — METHOCARBAMOL 500 MG PO TABS
500.0000 mg | ORAL_TABLET | Freq: Once | ORAL | Status: AC
  Administered 2021-10-21: 500 mg via ORAL

## 2021-10-21 MED ORDER — PHENYLEPHRINE 80 MCG/ML (10ML) SYRINGE FOR IV PUSH (FOR BLOOD PRESSURE SUPPORT)
PREFILLED_SYRINGE | INTRAVENOUS | Status: AC
  Filled 2021-10-21: qty 10

## 2021-10-21 MED ORDER — PROPOFOL 10 MG/ML IV BOLUS
INTRAVENOUS | Status: DC | PRN
  Administered 2021-10-21: 120 mg via INTRAVENOUS

## 2021-10-21 MED ORDER — LIDOCAINE 2% (20 MG/ML) 5 ML SYRINGE
INTRAMUSCULAR | Status: AC
  Filled 2021-10-21: qty 5

## 2021-10-21 MED ORDER — GLYCOPYRROLATE 0.2 MG/ML IJ SOLN
INTRAMUSCULAR | Status: DC | PRN
  Administered 2021-10-21: .1 mg via INTRAVENOUS

## 2021-10-21 MED ORDER — ROCURONIUM BROMIDE 100 MG/10ML IV SOLN
INTRAVENOUS | Status: DC | PRN
  Administered 2021-10-21: 10 mg via INTRAVENOUS
  Administered 2021-10-21: 60 mg via INTRAVENOUS

## 2021-10-21 MED ORDER — HYDROMORPHONE HCL 1 MG/ML IJ SOLN
0.2500 mg | INTRAMUSCULAR | Status: DC | PRN
  Administered 2021-10-21 (×4): 0.5 mg via INTRAVENOUS

## 2021-10-21 MED ORDER — LACTATED RINGERS IV SOLN: INTRAVENOUS | Status: DC

## 2021-10-21 MED ORDER — DEXAMETHASONE SODIUM PHOSPHATE 10 MG/ML IJ SOLN
INTRAMUSCULAR | Status: AC
  Filled 2021-10-21: qty 1

## 2021-10-21 MED ORDER — GELATIN ABSORBABLE 100 EX MISC
CUTANEOUS | Status: DC | PRN
  Administered 2021-10-21: 20 mL via TOPICAL

## 2021-10-21 MED ORDER — CHLORHEXIDINE GLUCONATE 0.12 % MT SOLN
15.0000 mL | Freq: Once | OROMUCOSAL | Status: AC
  Administered 2021-10-21: 15 mL via OROMUCOSAL
  Filled 2021-10-21: qty 15

## 2021-10-21 MED ORDER — MIDAZOLAM HCL 2 MG/2ML IJ SOLN
INTRAMUSCULAR | Status: AC
  Filled 2021-10-21: qty 2

## 2021-10-21 MED ORDER — METHOCARBAMOL 500 MG PO TABS
ORAL_TABLET | ORAL | Status: AC
  Filled 2021-10-21: qty 1

## 2021-10-21 MED ORDER — HYDROCODONE-ACETAMINOPHEN 5-325 MG PO TABS
1.0000 | ORAL_TABLET | Freq: Four times a day (QID) | ORAL | 0 refills | Status: DC | PRN
Start: 2021-10-21 — End: 2022-05-19

## 2021-10-21 MED ORDER — OXYCODONE HCL 5 MG/5ML PO SOLN: 5.0000 mg | Freq: Once | ORAL | Status: DC | PRN

## 2021-10-21 MED ORDER — LACTATED RINGERS IV SOLN: INTRAVENOUS | Status: DC | PRN

## 2021-10-21 MED ORDER — HYDROMORPHONE HCL 1 MG/ML IJ SOLN
INTRAMUSCULAR | Status: AC
  Filled 2021-10-21: qty 1

## 2021-10-21 MED ORDER — INSULIN ASPART 100 UNIT/ML IJ SOLN
0.0000 [IU] | INTRAMUSCULAR | Status: DC | PRN
  Administered 2021-10-21: 2 [IU] via SUBCUTANEOUS
  Filled 2021-10-21: qty 1

## 2021-10-21 MED ORDER — PHENYLEPHRINE 80 MCG/ML (10ML) SYRINGE FOR IV PUSH (FOR BLOOD PRESSURE SUPPORT)
PREFILLED_SYRINGE | INTRAVENOUS | Status: DC | PRN
  Administered 2021-10-21: 80 ug via INTRAVENOUS

## 2021-10-21 MED ORDER — ACETAMINOPHEN 500 MG PO TABS
1000.0000 mg | ORAL_TABLET | Freq: Once | ORAL | Status: AC
  Administered 2021-10-21: 1000 mg via ORAL
  Filled 2021-10-21: qty 2

## 2021-10-21 MED ORDER — THROMBIN 20000 UNITS EX SOLR
CUTANEOUS | Status: AC
  Filled 2021-10-21: qty 20000

## 2021-10-21 MED ORDER — BUPIVACAINE-EPINEPHRINE 0.25% -1:200000 IJ SOLN
INTRAMUSCULAR | Status: DC | PRN
  Administered 2021-10-21: 6 mL

## 2021-10-21 MED ORDER — EPHEDRINE SULFATE-NACL 50-0.9 MG/10ML-% IV SOSY
PREFILLED_SYRINGE | INTRAVENOUS | Status: DC | PRN
  Administered 2021-10-21: 5 mg via INTRAVENOUS

## 2021-10-21 MED ORDER — MIDAZOLAM HCL 5 MG/5ML IJ SOLN
INTRAMUSCULAR | Status: DC | PRN
  Administered 2021-10-21: 2 mg via INTRAVENOUS

## 2021-10-21 MED ORDER — ORAL CARE MOUTH RINSE: 15.0000 mL | Freq: Once | OROMUCOSAL | Status: AC

## 2021-10-21 MED ORDER — CEFAZOLIN SODIUM-DEXTROSE 2-4 GM/100ML-% IV SOLN
2.0000 g | INTRAVENOUS | Status: AC
  Administered 2021-10-21: 2 g via INTRAVENOUS
  Filled 2021-10-21: qty 100

## 2021-10-21 MED ORDER — PROPOFOL 10 MG/ML IV BOLUS
INTRAVENOUS | Status: AC
  Filled 2021-10-21: qty 20

## 2021-10-21 MED ORDER — METHOCARBAMOL 750 MG PO TABS: 750.0000 mg | ORAL_TABLET | Freq: Four times a day (QID) | ORAL | 0 refills | Status: DC | PRN

## 2021-10-21 MED ORDER — ONDANSETRON HCL 4 MG/2ML IJ SOLN
INTRAMUSCULAR | Status: DC | PRN
  Administered 2021-10-21: 4 mg via INTRAVENOUS

## 2021-10-21 MED ORDER — SUGAMMADEX SODIUM 200 MG/2ML IV SOLN
INTRAVENOUS | Status: DC | PRN
  Administered 2021-10-21: 200 mg via INTRAVENOUS

## 2021-10-21 MED ORDER — ONDANSETRON HCL 4 MG/2ML IJ SOLN
INTRAMUSCULAR | Status: AC
  Filled 2021-10-21: qty 2

## 2021-10-21 MED ORDER — BUPIVACAINE-EPINEPHRINE (PF) 0.25% -1:200000 IJ SOLN
INTRAMUSCULAR | Status: AC
  Filled 2021-10-21: qty 30

## 2021-10-21 SURGICAL SUPPLY — 78 items
BAG COUNTER SPONGE SURGICOUNT (BAG) ×3 IMPLANT
BENZOIN TINCTURE PRP APPL 2/3 (GAUZE/BANDAGES/DRESSINGS) ×2 IMPLANT
BIT DRILL NEURO 2X3.1 SFT TUCH (MISCELLANEOUS) ×1 IMPLANT
BIT DRILL SRG 14X2.2XFLT CHK (BIT) IMPLANT
BIT DRL SRG 14X2.2XFLT CHK (BIT) ×1
BLADE CLIPPER SURG (BLADE) ×2 IMPLANT
BLADE SURG 15 STRL LF DISP TIS (BLADE) ×1 IMPLANT
BLADE SURG 15 STRL SS (BLADE) ×1
BONE CERV LORDOTIC 14.5X12X8 (Bone Implant) ×2 IMPLANT
CARTRIDGE OIL MAESTRO DRILL (MISCELLANEOUS) ×1 IMPLANT
CNTNR URN SCR LID CUP LEK RST (MISCELLANEOUS) IMPLANT
CONT SPEC 4OZ STRL OR WHT (MISCELLANEOUS) ×1
CORD BIPOLAR FORCEPS 12FT (ELECTRODE) ×2 IMPLANT
COVER SURGICAL LIGHT HANDLE (MISCELLANEOUS) ×1 IMPLANT
DIFFUSER DRILL AIR PNEUMATIC (MISCELLANEOUS) ×2 IMPLANT
DRAIN JACKSON RD 7FR 3/32 (WOUND CARE) IMPLANT
DRAPE C-ARM 42X72 X-RAY (DRAPES) ×2 IMPLANT
DRAPE POUCH INSTRU U-SHP 10X18 (DRAPES) ×2 IMPLANT
DRAPE SURG 17X23 STRL (DRAPES) ×6 IMPLANT
DRILL BIT SKYLINE 14MM (BIT) ×1
DRILL NEURO 2X3.1 SOFT TOUCH (MISCELLANEOUS) ×2
DURAPREP 26ML APPLICATOR (WOUND CARE) ×2 IMPLANT
ELECT COATED BLADE 2.86 ST (ELECTRODE) ×2 IMPLANT
ELECT REM PT RETURN 9FT ADLT (ELECTROSURGICAL) ×2
ELECTRODE REM PT RTRN 9FT ADLT (ELECTROSURGICAL) ×1 IMPLANT
EVACUATOR SILICONE 100CC (DRAIN) IMPLANT
GAUZE 4X4 16PLY ~~LOC~~+RFID DBL (SPONGE) ×2 IMPLANT
GAUZE SPONGE 4X4 12PLY STRL (GAUZE/BANDAGES/DRESSINGS) ×2 IMPLANT
GLOVE BIO SURGEON STRL SZ7 (GLOVE) ×2 IMPLANT
GLOVE BIO SURGEON STRL SZ8 (GLOVE) ×2 IMPLANT
GLOVE BIOGEL PI IND STRL 7.0 (GLOVE) ×2 IMPLANT
GLOVE BIOGEL PI IND STRL 8 (GLOVE) ×1 IMPLANT
GLOVE BIOGEL PI INDICATOR 7.0 (GLOVE) ×2
GLOVE BIOGEL PI INDICATOR 8 (GLOVE) ×1
GLOVE SURG ENC MOIS LTX SZ6.5 (GLOVE) ×2 IMPLANT
GOWN STRL REUS W/ TWL LRG LVL3 (GOWN DISPOSABLE) ×1 IMPLANT
GOWN STRL REUS W/ TWL XL LVL3 (GOWN DISPOSABLE) ×1 IMPLANT
GOWN STRL REUS W/TWL LRG LVL3 (GOWN DISPOSABLE) ×1
GOWN STRL REUS W/TWL XL LVL3 (GOWN DISPOSABLE) ×1
GRAFT BNE SPCR VG2 14.5X12X8 (Bone Implant) IMPLANT
IV CATH 14GX2 1/4 (CATHETERS) ×2 IMPLANT
KIT BASIN OR (CUSTOM PROCEDURE TRAY) ×2 IMPLANT
KIT TURNOVER KIT B (KITS) ×2 IMPLANT
MANIFOLD NEPTUNE II (INSTRUMENTS) ×2 IMPLANT
NDL PRECISIONGLIDE 27X1.5 (NEEDLE) ×1 IMPLANT
NDL SPNL 20GX3.5 QUINCKE YW (NEEDLE) ×1 IMPLANT
NEEDLE PRECISIONGLIDE 27X1.5 (NEEDLE) ×2 IMPLANT
NEEDLE SPNL 20GX3.5 QUINCKE YW (NEEDLE) ×2 IMPLANT
NS IRRIG 1000ML POUR BTL (IV SOLUTION) ×3 IMPLANT
OIL CARTRIDGE MAESTRO DRILL (MISCELLANEOUS) ×2
PACK ORTHO CERVICAL (CUSTOM PROCEDURE TRAY) ×2 IMPLANT
PAD ARMBOARD 7.5X6 YLW CONV (MISCELLANEOUS) ×4 IMPLANT
PATTIES SURGICAL .5 X.5 (GAUZE/BANDAGES/DRESSINGS) IMPLANT
PATTIES SURGICAL .5 X1 (DISPOSABLE) IMPLANT
PENCIL BUTTON HOLSTER BLD 10FT (ELECTRODE) ×1 IMPLANT
PIN DISTRACTION 14 (PIN) ×2 IMPLANT
PLATE ONE LEVEL SKYLINE 14MM (Plate) ×1 IMPLANT
POSITIONER HEAD DONUT 9IN (MISCELLANEOUS) ×2 IMPLANT
SCREW SKYLINE VAR OS 14MM (Screw) ×4 IMPLANT
SLEEVE SURGEON STRL (DRAPES) ×1 IMPLANT
SPONGE INTESTINAL PEANUT (DISPOSABLE) ×4 IMPLANT
SPONGE SURGIFOAM ABS GEL 100 (HEMOSTASIS) ×1 IMPLANT
STRIP CLOSURE SKIN 1/2X4 (GAUZE/BANDAGES/DRESSINGS) ×2 IMPLANT
SURGIFLO W/THROMBIN 8M KIT (HEMOSTASIS) IMPLANT
SUT MNCRL AB 4-0 PS2 18 (SUTURE) ×2 IMPLANT
SUT SILK 4 0 (SUTURE)
SUT SILK 4-0 18XBRD TIE 12 (SUTURE) IMPLANT
SUT VIC AB 2-0 CT2 18 VCP726D (SUTURE) ×2 IMPLANT
SYR BULB IRRIG 60ML STRL (SYRINGE) ×2 IMPLANT
SYR CONTROL 10ML LL (SYRINGE) ×4 IMPLANT
TAPE CLOTH 4X10 WHT NS (GAUZE/BANDAGES/DRESSINGS) ×2 IMPLANT
TAPE CLOTH SURG 4X10 WHT LF (GAUZE/BANDAGES/DRESSINGS) ×1 IMPLANT
TAPE UMBILICAL 1/8X30 (MISCELLANEOUS) ×2 IMPLANT
TAPE UMBILICAL COTTON 1/8X30 (MISCELLANEOUS) ×1 IMPLANT
TOWEL GREEN STERILE (TOWEL DISPOSABLE) ×2 IMPLANT
TOWEL GREEN STERILE FF (TOWEL DISPOSABLE) ×2 IMPLANT
WATER STERILE IRR 1000ML POUR (IV SOLUTION) ×1 IMPLANT
YANKAUER SUCT BULB TIP NO VENT (SUCTIONS) ×2 IMPLANT

## 2021-10-21 NOTE — Transfer of Care (Signed)
Immediate Anesthesia Transfer of Care Note  Patient: Kenneth Joseph  Procedure(s) Performed: ANTERIOR CERVICAL DECOMPRESSION FUSION CERVICAL FOUR- CERVICAL FIVE WITH INSTRUMENTATION AND ALLOGRAFT  Patient Location: PACU  Anesthesia Type:General  Level of Consciousness: awake, drowsy and patient cooperative  Airway & Oxygen Therapy: Patient Spontanous Breathing  Post-op Assessment: Report given to RN, Post -op Vital signs reviewed and stable and Patient moving all extremities X 4  Post vital signs: Reviewed and stable  Last Vitals:  Vitals Value Taken Time  BP 120/86 10/21/21 0927  Temp    Pulse 100 10/21/21 0930  Resp 16 10/21/21 0930  SpO2 92 % 10/21/21 0930  Vitals shown include unvalidated device data.  Last Pain:  Vitals:   10/21/21 0629  TempSrc:   PainSc: 2          Complications: No notable events documented.

## 2021-10-21 NOTE — Op Note (Signed)
PATIENT NAME: Kenneth Joseph   MEDICAL RECORD NO.:   [id number]    DATE OF BIRTH: [birth year]   DATE OF PROCEDURE: 10/21/2021                               OPERATIVE REPORT     PREOPERATIVE DIAGNOSES: 1.  Cervical myelopathy 2.  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  Cervical myelopathy 2.  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 Anterior cervical decompression and fusion C4/5 2. Placement of anterior instrumentation, C4-C5 3. Utilization of structural allograft (52mm lordotic VG-2) 4. Intraoperative use of fluoroscopy.   SURGEON:  Estill Bamberg, MD   ASSISTANT:  Jason Coop, PA-C.   ANESTHESIA:  General endotracheal anesthesia.   COMPLICATIONS:  None.   DISPOSITION:  Stable.   ESTIMATED BLOOD LOSS:  Minimal.   INDICATIONS FOR SURGERY:  Briefly, [name] is a pleasant 66 y.o. year- old male, who did present to me with symptoms consistent with progressive cervical myelopathy.  Specifically, he has reported progressive balance deterioration and deterioration in his hand dexterity.  His MRI findings were as outlined above.  Given his progressive symptoms and cord compression, we did discuss proceeding with the surgery noted above. The patient was fully aware of the risks and limitations of surgery as outlined in my preoperative note.   OPERATIVE DETAILS:  On 10/21/2021, the patient was brought to surgery and general endotracheal anesthesia was administered.  The patient was placed supine on the hospital bed. The neck was gently extended.  All bony prominences were meticulously padded.  The neck was prepped and draped in the usual sterile fashion.  At this point, I did make a left-sided transverse incision.  The platysma was incised.  A Smith-Robinson approach was used and the anterior spine was identified. A self-retaining retractor was placed.  I then subperiosteally exposed the  vertebral bodies from C4-C5.  Caspar pins were then placed into the C4 and C5 vertebral bodies and distraction was applied.  A thorough and complete C4/5 intervertebral diskectomy was performed.  The posterior longitudinal ligament was identified and entered using a nerve hook.  I then used #1 followed by #2 Kerrison to perform a thorough and complete intervertebral diskectomy.  The spinal canal was thoroughly decompressed.  The endplates were then prepared and the appropriate-sized structural allograft was inserted into the intervertebral space.  An excellent press-fit was noted. The Caspar pins then were removed and bone wax was placed in their place.  The appropriate-sized anterior cervical plate was placed over the anterior spine.  14 mm variable angle screws were placed, 2 in each vertebral body from C4-C5 for a total of 4 vertebral body screws.  The screws were then locked to the plate using the Cam locking mechanism.  I was very pleased with the final fluoroscopic images.  The wound was then irrigated.  The wound was then explored for any undue bleeding and there was no bleeding noted. The wound was then closed in layers using 2-0 Vicryl, followed by 4-0 Monocryl.  Benzoin and Steri-Strips were applied, followed by sterile dressing.  All instrument counts were correct at the termination of the procedure.   Of note, Jason Coop, PA-C, was my assistant throughout surgery, and did aid in retraction, suctioning, placement of the hardware, and closure from start to finish.       Estill Bamberg, MD

## 2021-10-21 NOTE — H&P (Signed)
PREOPERATIVE H&P  Chief Complaint: Balance deterioration  HPI: Kenneth Joseph is a 66 y.o. male who presents with ongoing deterioration in balance and loss of dexterity  MRI reveals cord compression at C4/5  Patient has failed multiple forms of conservative care and continues to have pain (see office notes for additional details regarding the patient's full course of treatment)  Past Medical History:  Diagnosis Date   Anemia    Arthritis    Diabetes mellitus without complication ([place])    type 2 per patient   Hepatitis C 2001   History of hiatal hernia    History of kidney stones    Hypertension    Past Surgical History:  Procedure Laterality Date   EYE SURGERY     bilateral cataract removal   Social History   Socioeconomic History   Marital status: Legally Separated    Spouse name: Not on file   Number of children: Not on file   Years of education: Not on file   Highest education level: Not on file  Occupational History   Not on file  Tobacco Use   Smoking status: Never   Smokeless tobacco: Never  Vaping Use   Vaping Use: Never used  Substance and Sexual Activity   Alcohol use: Yes    Comment: 4 times a week/brandy   Drug use: No   Sexual activity: Not on file  Other Topics Concern   Not on file  Social History Narrative   Not on file   Social Determinants of Health   Financial Resource Strain: Not on file  Food Insecurity: Not on file  Transportation Needs: Not on file  Physical Activity: Not on file  Stress: Not on file  Social Connections: Not on file   Family History  Problem Relation Age of Onset   Hypertension Mother    Mental illness Mother    Diabetes Father    Heart disease Father    Hypertension Father    Stroke Father    No Known Allergies Prior to Admission medications   Medication Sig Start Date End Date Taking? Authorizing Provider  amLODipine-olmesartan (AZOR) 10-40 MG tablet Take 0.5 tablets by mouth in the morning.    Yes [provider]  aspirin EC 81 MG tablet Take 81 mg by mouth daily.   Yes [provider]  atorvastatin (LIPITOR) 80 MG tablet Take 80 mg by mouth daily at 6 PM.   Yes [provider]  DOXYCYCLINE CALCIUM PO Take by mouth 2 (two) times daily. Patient unsure of dose   Yes [provider]  glipiZIDE-metformin (METAGLIP) 5-500 MG tablet Take 1 tablet by mouth See admin instructions. Take 1 tab in the morning and 0.5 tab in the evening 07/15/21  Yes [provider]  HYDROCODONE-ACETAMINOPHEN PO Take by mouth 3 (three) times daily as needed. Patient unsure of dose   Yes [provider]  meloxicam (MOBIC) 15 MG tablet Take 15 mg by mouth daily.   Yes [provider]  Vitamin D, Ergocalciferol, (DRISDOL) 1.25 MG (50000 UNIT) CAPS capsule Take 50,000 Units by mouth 2 (two) times a week. 08/17/21  Yes [provider]     All other systems have been reviewed and were otherwise negative with the exception of those mentioned in the HPI and as above.  Physical Exam: Vitals:   10/21/21 0607  BP: 115/86  Pulse: 92  Resp: 17  Temp: 97.9 F (36.6 C)  SpO2: 98%  Body mass index is 24.39 kg/m.  General: Alert, no acute distress Cardiovascular: No pedal edema Respiratory: No cyanosis, no use of accessory musculature Skin: No lesions in the area of chief complaint Neurologic: Sensation intact distally Psychiatric: Patient is competent for consent with normal mood and affect Lymphatic: No axillary or cervical lymphadenopathy   Assessment/Plan: Cervical myelopathy, with spinal cord compression identified at C4-5 Plan for Procedure(s): ANTERIOR CERVICAL DECOMPRESSION FUSION CERVICAL 4- CERVICAL 5 WITH INSTRUMENTATION AND ALLOGRAFT   Norva Karvonen, MD 10/21/2021 7:16 AM

## 2021-10-21 NOTE — Anesthesia Postprocedure Evaluation (Signed)
Anesthesia Post Note  Patient: Kenneth Joseph  Procedure(s) Performed: ANTERIOR CERVICAL DECOMPRESSION FUSION CERVICAL FOUR- CERVICAL FIVE WITH INSTRUMENTATION AND ALLOGRAFT     Patient location during evaluation: PACU Anesthesia Type: General Level of consciousness: awake and alert, patient cooperative and oriented Pain management: pain level controlled Vital Signs Assessment: post-procedure vital signs reviewed and stable Respiratory status: spontaneous breathing, nonlabored ventilation and respiratory function stable Cardiovascular status: blood pressure returned to baseline and stable Postop Assessment: no apparent nausea or vomiting Anesthetic complications: no   No notable events documented.  Last Vitals:  Vitals:   10/21/21 0955 10/21/21 1010  BP: (!) 120/91 123/85  Pulse: 98 90  Resp: 12 12  Temp:  36.7 C  SpO2: 97% 93%    Last Pain:  Vitals:   10/21/21 1010  TempSrc:   PainSc: 3                  Dynastie Knoop,E. Jillyn Stacey

## 2021-10-21 NOTE — Anesthesia Procedure Notes (Signed)
Procedure Name: Intubation Date/Time: 10/21/2021 7:42 AM Performed by: Gwyndolyn Saxon, CRNA Pre-anesthesia Checklist: Patient identified, Emergency Drugs available, Suction available and Patient being monitored Patient Re-evaluated:Patient Re-evaluated prior to induction Oxygen Delivery Method: Circle system utilized Preoxygenation: Pre-oxygenation with 100% oxygen Induction Type: IV induction Ventilation: Two handed mask ventilation required Laryngoscope Size: Glidescope and 4 Grade View: Grade I Tube type: Oral Tube size: 7.5 mm Number of attempts: 1 Airway Equipment and Method: Rigid stylet and Video-laryngoscopy Placement Confirmation: ETT inserted through vocal cords under direct vision, positive ETCO2 and breath sounds checked- equal and bilateral Secured at: 23 cm Tube secured with: Tape Dental Injury: Teeth and Oropharynx as per pre-operative assessment

## 2021-11-01 DIAGNOSIS — R790 Abnormal level of blood mineral: Secondary | ICD-10-CM | POA: Diagnosis not present

## 2021-11-01 DIAGNOSIS — E559 Vitamin D deficiency, unspecified: Secondary | ICD-10-CM | POA: Diagnosis not present

## 2021-11-01 DIAGNOSIS — R7989 Other specified abnormal findings of blood chemistry: Secondary | ICD-10-CM | POA: Diagnosis not present

## 2021-11-01 DIAGNOSIS — E538 Deficiency of other specified B group vitamins: Secondary | ICD-10-CM | POA: Diagnosis not present

## 2021-12-03 DIAGNOSIS — M4322 Fusion of spine, cervical region: Secondary | ICD-10-CM | POA: Diagnosis not present

## 2021-12-13 DIAGNOSIS — M542 Cervicalgia: Secondary | ICD-10-CM | POA: Diagnosis not present

## 2021-12-17 DIAGNOSIS — M542 Cervicalgia: Secondary | ICD-10-CM | POA: Diagnosis not present

## 2021-12-21 DIAGNOSIS — M542 Cervicalgia: Secondary | ICD-10-CM | POA: Diagnosis not present

## 2021-12-22 DIAGNOSIS — E7800 Pure hypercholesterolemia, unspecified: Secondary | ICD-10-CM | POA: Diagnosis not present

## 2021-12-22 DIAGNOSIS — I10 Essential (primary) hypertension: Secondary | ICD-10-CM | POA: Diagnosis not present

## 2021-12-22 DIAGNOSIS — Z0000 Encounter for general adult medical examination without abnormal findings: Secondary | ICD-10-CM | POA: Diagnosis not present

## 2021-12-22 DIAGNOSIS — E538 Deficiency of other specified B group vitamins: Secondary | ICD-10-CM | POA: Diagnosis not present

## 2021-12-22 DIAGNOSIS — E559 Vitamin D deficiency, unspecified: Secondary | ICD-10-CM | POA: Diagnosis not present

## 2021-12-22 DIAGNOSIS — R7989 Other specified abnormal findings of blood chemistry: Secondary | ICD-10-CM | POA: Diagnosis not present

## 2021-12-22 DIAGNOSIS — Z125 Encounter for screening for malignant neoplasm of prostate: Secondary | ICD-10-CM | POA: Diagnosis not present

## 2021-12-22 DIAGNOSIS — E1169 Type 2 diabetes mellitus with other specified complication: Secondary | ICD-10-CM | POA: Diagnosis not present

## 2021-12-22 DIAGNOSIS — I700 Atherosclerosis of aorta: Secondary | ICD-10-CM | POA: Diagnosis not present

## 2021-12-22 DIAGNOSIS — Z1331 Encounter for screening for depression: Secondary | ICD-10-CM | POA: Diagnosis not present

## 2021-12-24 DIAGNOSIS — M542 Cervicalgia: Secondary | ICD-10-CM | POA: Diagnosis not present

## 2021-12-28 DIAGNOSIS — M542 Cervicalgia: Secondary | ICD-10-CM | POA: Diagnosis not present

## 2021-12-31 DIAGNOSIS — M542 Cervicalgia: Secondary | ICD-10-CM | POA: Diagnosis not present

## 2022-01-04 DIAGNOSIS — M542 Cervicalgia: Secondary | ICD-10-CM | POA: Diagnosis not present

## 2022-01-05 DIAGNOSIS — Z0100 Encounter for examination of eyes and vision without abnormal findings: Secondary | ICD-10-CM | POA: Diagnosis not present

## 2022-01-07 DIAGNOSIS — M542 Cervicalgia: Secondary | ICD-10-CM | POA: Diagnosis not present

## 2022-01-11 DIAGNOSIS — M542 Cervicalgia: Secondary | ICD-10-CM | POA: Diagnosis not present

## 2022-01-14 DIAGNOSIS — M4322 Fusion of spine, cervical region: Secondary | ICD-10-CM | POA: Diagnosis not present

## 2022-01-14 DIAGNOSIS — Z9889 Other specified postprocedural states: Secondary | ICD-10-CM | POA: Diagnosis not present

## 2022-01-21 DIAGNOSIS — E538 Deficiency of other specified B group vitamins: Secondary | ICD-10-CM | POA: Diagnosis not present

## 2022-01-24 ENCOUNTER — Encounter: Payer: Self-pay | Admitting: Neurology

## 2022-01-24 ENCOUNTER — Ambulatory Visit: Payer: Medicare HMO | Admitting: Neurology

## 2022-01-24 VITALS — BP 131/95 | HR 93 | Ht 70.5 in | Wt 176.0 lb

## 2022-01-24 DIAGNOSIS — G959 Disease of spinal cord, unspecified: Secondary | ICD-10-CM | POA: Diagnosis not present

## 2022-01-24 DIAGNOSIS — R202 Paresthesia of skin: Secondary | ICD-10-CM | POA: Diagnosis not present

## 2022-01-24 NOTE — Progress Notes (Signed)
[facility] Neurology Division Clinic Note - Initial Visit   Date: 01/24/2022   Kenneth Joseph MRN: [id number] DOB: [birth year]   Dear Dr. Katrinka Blazing:  Thank you for your kind referral of Kenneth Joseph for consultation of bilateral leg paresthesias. Although his history is well known to you, please allow [place] to reiterate it for the purpose of our medical record. The patient was accompanied to the clinic by self.    Kenneth Joseph is a 66 y.o. right-handed male with cervical myelopathy due to disc herniation at C4-5 s/p decompression with fusion, diabetes mellitus type 2 (HbA1c 5.6), hypertension, and hyperlipidemia presenting for evaluation of numbness of the legs.   IMPRESSION/PLAN: Bilateral leg paresthesias due to residual deficits from cord compression from cervical myelopathy, less likely neuropathy given the acute onset of symptoms.  To better differentiate symptoms, he would like to proceed with NCS/EMG of the legs.    Further recommendations pending results.   ------------------------------------------------------------- History of present illness: Starting around March 2023, began having numbness in the back, arms, chest, lower legs and feet.  He was found to have C4-5 disc herniation resulting in spinal cord compression and underwent decompression and fusion in May.  Following surgery, numbness involving the chest, back and arms has improved.  However, he continues to have tingling in the fingertips and numbness lower legs.  Balance is fair.  He has fallen twice in the past year. He is concerned about having neuropathy.   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  Nonsmoker.  He lives in two level home with nephew.     Out-side paper records, electronic medical record, and images have been reviewed where available and summarized as:  Lab Results  Component Value Date   HGBA1C 5.6 10/14/2021    Past Medical History:  Diagnosis Date   Anemia     Arthritis    Diabetes mellitus without complication (HCC)    type 2 per patient   Hepatitis C 2001   History of hiatal hernia    History of kidney stones    Hypertension     Past Surgical History:  Procedure Laterality Date   ANTERIOR CERVICAL DECOMP/DISCECTOMY FUSION N/A 10/21/2021   Procedure: ANTERIOR CERVICAL DECOMPRESSION FUSION CERVICAL FOUR- CERVICAL FIVE WITH INSTRUMENTATION AND ALLOGRAFT;  Surgeon: Estill Bamberg, MD;  Location: [facility] OR;  Service: Orthopedics;  Laterality: N/A;   EYE SURGERY     bilateral cataract removal     Medications:  Outpatient Encounter Medications as of 01/24/2022  Medication Sig Note   amLODipine-olmesartan (AZOR) 10-40 MG tablet Take 0.5 tablets by mouth in the morning.    atorvastatin (LIPITOR) 80 MG tablet Take 80 mg by mouth daily at 6 PM.    meloxicam (MOBIC) 15 MG tablet Take 15 mg by mouth daily.    Vitamin D, Ergocalciferol, (DRISDOL) 1.25 MG (50000 UNIT) CAPS capsule Take 50,000 Units by mouth 2 (two) times a week.    DOXYCYCLINE CALCIUM PO Take by mouth 2 (two) times daily. Patient unsure of dose (Patient not taking: Reported on 01/24/2022) 10/21/2021: Patient unsure of dose, prescribed for knee   glipiZIDE-metformin (METAGLIP) 5-500 MG tablet Take 1 tablet by mouth See admin instructions. Take 1 tab in the morning and 0.5 tab in the evening (Patient not taking: Reported on 01/24/2022)    HYDROcodone-acetaminophen (NORCO/VICODIN) 5-325 MG tablet Take 1 tablet by mouth every 6 (six) hours as needed for moderate pain or severe pain. (Patient not taking: Reported on 01/24/2022)  HYDROCODONE-ACETAMINOPHEN PO Take by mouth 3 (three) times daily as needed. Patient unsure of dose (Patient not taking: Reported on 01/24/2022) 10/21/2021: Patient unsure of dose- prescribed forknee   methocarbamol (ROBAXIN) 750 MG tablet Take 1 tablet (750 mg total) by mouth every 6 (six) hours as needed for muscle spasms. (Patient not taking: Reported on 01/24/2022)    No  facility-administered encounter medications on file as of 01/24/2022.    Allergies: No Known Allergies  Family History: Family History  Problem Relation Age of Onset   Hypertension Mother    Mental illness Mother    Diabetes Father    Heart disease Father    Hypertension Father    Stroke Father     Social History: Social History   Tobacco Use   Smoking status: Never   Smokeless tobacco: Never  Vaping Use   Vaping Use: Never used  Substance Use Topics   Alcohol use: Yes    Comment: 4 times a week/brandy   Drug use: No   Social History   Social History Narrative   Right handed   Caffeine none   Lives with two story    Vital Signs:  BP (!) 131/95   Pulse 93   Ht 5' 10.5" (1.791 m)   Wt 176 lb (79.8 kg)   SpO2 95%   BMI 24.90 kg/m   Neurological Exam: MENTAL STATUS including orientation to time, place, person, recent and remote memory, attention span and concentration, language, and fund of knowledge is normal.  Speech is not dysarthric.  CRANIAL NERVES: II:  No visual field defects. III-IV-VI: Pupils equal round and reactive to light.  Normal conjugate, extra-ocular eye movements in all directions of gaze.  No nystagmus.  No ptosis.   V:  Normal facial sensation.    VII:  Normal facial symmetry and movements.   VIII:  Normal hearing and vestibular function.   IX-X:  Normal palatal movement.   XI:  Normal shoulder shrug and head rotation.   XII:  Normal tongue strength and range of motion, no deviation or fasciculation.  MOTOR:  No atrophy, fasciculations or abnormal movements.  No pronator drift.   Upper Extremity:  Right  Left  Deltoid  5/5   5/5   Biceps  5/5   5/5   Triceps  5/5   5/5   Infraspinatus 5/5  5/5  Medial pectoralis 5/5  5/5  Wrist extensors  5/5   5/5   Wrist flexors  5/5   5/5   Finger extensors  5/5   5/5   Finger flexors  5/5   5/5   Dorsal interossei  5/5   5/5   Abductor pollicis  5-/5   5-/5   Tone (Ashworth scale)  0  0    Lower Extremity:  Right  Left  Hip flexors  5/5   5/5   Hip extensors  5/5   5/5   Adductor 5/5  5/5  Abductor 5/5  5/5  Knee flexors  5/5   5/5   Knee extensors  5-/5   5-/5   Dorsiflexors  5-/5   5-/5   Plantarflexors  5-/5   5-/5   Toe extensors  5-/5   5-/5   Toe flexors  5-/5   5-/5   Tone (Ashworth scale)  0  0   MSRs:  Right        Left  brachioradialis 2+  2+  biceps 2+  2+  triceps 2+  2+  patellar 3+  3+  ankle jerk 2+  2+  Hoffman no  no  plantar response down  down   SENSORY:  Vibration diminished at ankles and absent at the great toe, temperature and pin prick reduce below the knees bilaterally.  There is mild sway with Rhomberg testing.  COORDINATION/GAIT: Normal finger-to- nose-finger.  Intact rapid alternating movements bilaterally.  Able to rise from a chair without using arms.  Gait narrow based and stable.  Stressed gait intact, unsteady with tandem gait.   Thank you for allowing me to participate in patient's care.  If I can answer any additional questions, I would be pleased to do so.    Sincerely,    Janelle Culton K. Allena Katz, DO

## 2022-01-24 NOTE — Patient Instructions (Signed)
Nerve testing of both legs.  ELECTROMYOGRAM AND NERVE CONDUCTION STUDIES (EMG/NCS) INSTRUCTIONS  How to Prepare The neurologist conducting the EMG will need to know if you have certain medical conditions. Tell the neurologist and other EMG lab personnel if you: . Have a pacemaker or any other electrical medical device . Take blood-thinning medications . Have hemophilia, a blood-clotting disorder that causes prolonged bleeding Bathing Take a shower or bath shortly before your exam in order to remove oils from your skin. Don't apply lotions or creams before the exam.  What to Expect You'll likely be asked to change into a hospital gown for the procedure and lie down on an examination table. The following explanations can help you understand what will happen during the exam.  . Electrodes. The neurologist or a technician places surface electrodes at various locations on your skin depending on where you're experiencing symptoms. Or the neurologist may insert needle electrodes at different sites depending on your symptoms.  . Sensations. The electrodes will at times transmit a tiny electrical current that you may feel as a twinge or spasm. The needle electrode may cause discomfort or pain that usually ends shortly after the needle is removed. If you are concerned about discomfort or pain, you may want to talk to the neurologist about taking a short break during the exam.  . Instructions. During the needle EMG, the neurologist will assess whether there is any spontaneous electrical activity when the muscle is at rest - activity that isn't present in healthy muscle tissue - and the degree of activity when you slightly contract the muscle.  He or she will give you instructions on resting and contracting a muscle at appropriate times. Depending on what muscles and nerves the neurologist is examining, he or she may ask you to change positions during the exam.  After your EMG You may experience some  temporary, minor bruising where the needle electrode was inserted into your muscle. This bruising should fade within several days. If it persists, contact your primary care doctor.    

## 2022-02-25 DIAGNOSIS — E538 Deficiency of other specified B group vitamins: Secondary | ICD-10-CM | POA: Diagnosis not present

## 2022-03-10 ENCOUNTER — Encounter: Payer: Medicare HMO | Admitting: Neurology

## 2022-03-28 DIAGNOSIS — E538 Deficiency of other specified B group vitamins: Secondary | ICD-10-CM | POA: Diagnosis not present

## 2022-04-28 DIAGNOSIS — E538 Deficiency of other specified B group vitamins: Secondary | ICD-10-CM | POA: Diagnosis not present

## 2022-05-16 DIAGNOSIS — S99922A Unspecified injury of left foot, initial encounter: Secondary | ICD-10-CM | POA: Diagnosis not present

## 2022-05-16 DIAGNOSIS — S99912A Unspecified injury of left ankle, initial encounter: Secondary | ICD-10-CM | POA: Diagnosis not present

## 2022-05-16 DIAGNOSIS — S8262XA Displaced fracture of lateral malleolus of left fibula, initial encounter for closed fracture: Secondary | ICD-10-CM | POA: Diagnosis not present

## 2022-05-18 ENCOUNTER — Emergency Department (HOSPITAL_COMMUNITY)
Admission: EM | Admit: 2022-05-18 | Discharge: 2022-05-19 | Disposition: A | Discharge status: Home / Self Care | Payer: Medicare HMO | Attending: Emergency Medicine | Admitting: Emergency Medicine

## 2022-05-18 ENCOUNTER — Encounter (HOSPITAL_COMMUNITY): Payer: Self-pay | Admitting: Emergency Medicine

## 2022-05-18 ENCOUNTER — Emergency Department (HOSPITAL_COMMUNITY): Payer: Medicare HMO

## 2022-05-18 DIAGNOSIS — E119 Type 2 diabetes mellitus without complications: Secondary | ICD-10-CM | POA: Insufficient documentation

## 2022-05-18 DIAGNOSIS — W06XXXA Fall from bed, initial encounter: Secondary | ICD-10-CM | POA: Diagnosis not present

## 2022-05-18 DIAGNOSIS — S199XXA Unspecified injury of neck, initial encounter: Secondary | ICD-10-CM | POA: Diagnosis not present

## 2022-05-18 DIAGNOSIS — W19XXXA Unspecified fall, initial encounter: Secondary | ICD-10-CM

## 2022-05-18 DIAGNOSIS — M25519 Pain in unspecified shoulder: Secondary | ICD-10-CM | POA: Diagnosis not present

## 2022-05-18 DIAGNOSIS — S161XXA Strain of muscle, fascia and tendon at neck level, initial encounter: Secondary | ICD-10-CM | POA: Insufficient documentation

## 2022-05-18 DIAGNOSIS — M25512 Pain in left shoulder: Secondary | ICD-10-CM | POA: Insufficient documentation

## 2022-05-18 DIAGNOSIS — R0789 Other chest pain: Secondary | ICD-10-CM | POA: Insufficient documentation

## 2022-05-18 DIAGNOSIS — Z7984 Long term (current) use of oral hypoglycemic drugs: Secondary | ICD-10-CM | POA: Insufficient documentation

## 2022-05-18 DIAGNOSIS — S0990XA Unspecified injury of head, initial encounter: Secondary | ICD-10-CM | POA: Insufficient documentation

## 2022-05-18 DIAGNOSIS — M542 Cervicalgia: Secondary | ICD-10-CM | POA: Diagnosis not present

## 2022-05-18 DIAGNOSIS — M1612 Unilateral primary osteoarthritis, left hip: Secondary | ICD-10-CM | POA: Diagnosis not present

## 2022-05-18 DIAGNOSIS — Z743 Need for continuous supervision: Secondary | ICD-10-CM | POA: Diagnosis not present

## 2022-05-18 DIAGNOSIS — M25552 Pain in left hip: Secondary | ICD-10-CM | POA: Diagnosis not present

## 2022-05-18 DIAGNOSIS — R9431 Abnormal electrocardiogram [ECG] [EKG]: Secondary | ICD-10-CM | POA: Diagnosis not present

## 2022-05-18 DIAGNOSIS — Z043 Encounter for examination and observation following other accident: Secondary | ICD-10-CM | POA: Diagnosis not present

## 2022-05-18 DIAGNOSIS — R0781 Pleurodynia: Secondary | ICD-10-CM | POA: Diagnosis not present

## 2022-05-18 LAB — CBC WITH DIFFERENTIAL/PLATELET
Abs Immature Granulocytes: 0.05 10*3/uL (ref 0.00–0.07)
Basophils Absolute: 0 10*3/uL (ref 0.0–0.1)
Basophils Relative: 0 %
Eosinophils Absolute: 0 10*3/uL (ref 0.0–0.5)
Eosinophils Relative: 0 %
HCT: 37.8 % — ABNORMAL LOW (ref 39.0–52.0)
Hemoglobin: 13.3 g/dL (ref 13.0–17.0)
Immature Granulocytes: 1 %
Lymphocytes Relative: 10 %
Lymphs Abs: 1 10*3/uL (ref 0.7–4.0)
MCH: 36.2 pg — ABNORMAL HIGH (ref 26.0–34.0)
MCHC: 35.2 g/dL (ref 30.0–36.0)
MCV: 103 fL — ABNORMAL HIGH (ref 80.0–100.0)
Monocytes Absolute: 0.7 10*3/uL (ref 0.1–1.0)
Monocytes Relative: 8 %
Neutro Abs: 7.8 10*3/uL — ABNORMAL HIGH (ref 1.7–7.7)
Neutrophils Relative %: 81 %
Platelets: 158 10*3/uL (ref 150–400)
RBC: 3.67 MIL/uL — ABNORMAL LOW (ref 4.22–5.81)
RDW: 14 % (ref 11.5–15.5)
WBC: 9.6 10*3/uL (ref 4.0–10.5)
nRBC: 0 % (ref 0.0–0.2)

## 2022-05-18 LAB — BASIC METABOLIC PANEL
Anion gap: 14 (ref 5–15)
BUN: 15 mg/dL (ref 8–23)
CO2: 23 mmol/L (ref 22–32)
Calcium: 8.1 mg/dL — ABNORMAL LOW (ref 8.9–10.3)
Chloride: 104 mmol/L (ref 98–111)
Creatinine, Ser: 1.19 mg/dL (ref 0.61–1.24)
GFR, Estimated: >60 mL/min (ref >60)
Glucose, Bld: 111 mg/dL — ABNORMAL HIGH (ref 70–99)
Potassium: 3.7 mmol/L (ref 3.5–5.1)
Sodium: 141 mmol/L (ref 135–145)

## 2022-05-18 MED ORDER — ACETAMINOPHEN 500 MG PO TABS
1000.0000 mg | ORAL_TABLET | Freq: Once | ORAL | Status: AC
  Administered 2022-05-19: 1000 mg via ORAL
  Filled 2022-05-18: qty 2

## 2022-05-18 NOTE — ED Provider Triage Note (Signed)
Emergency Medicine Provider Triage Evaluation Note  Kenneth Joseph , a 66 y.o. male  was evaluated in triage.  Pt complains of fall.  Patient was in his bed, fell out of the bed landing on his left side.  He is not sure if he hit his head but is having pain to the right side of his neck.  He is also having pain to the left shoulder, left rib cage, left hip.  Unable to ambulate.  Does not feel short of breath, no saddle anesthesia.  Not on blood thinners..  Review of Systems  Per HPI  Physical Exam  BP (!) 121/94   Pulse (!) 116   Temp 98.1 F (36.7 C) (Oral)   Resp 16   Ht 5\' 10"  (1.778 m)   Wt 80 kg   SpO2 97%   BMI 25.31 kg/m  Gen:   Awake, no distress   Resp:  Normal effort  MSK:   Moves extremities without difficulty  Other:  Abdomen is soft and nontender.  Contusion periumbilically but not acute per patient.  Tenderness to left hip, left rib cage.   Medical Decision Making  Medically screening exam initiated at 2:49 PM.  Appropriate orders placed.  Haruki Arnold was informed that the remainder of the evaluation will be completed by another provider, this initial triage assessment does not replace that evaluation, and the importance of remaining in the ED until their evaluation is complete.     Viviana Simpler, PA-C 05/18/22 1450

## 2022-05-18 NOTE — ED Triage Notes (Signed)
Pt arrives via EMS from home where he fell Sunday when going downstairs, broke left ankle. Pt was sitting yesterday on side of bed and fell, pt was on floor for 6 hours. Pt endorses left hip pain, upper abd/rib pain, shoulder pain and right sided neck pain.

## 2022-05-19 ENCOUNTER — Emergency Department (HOSPITAL_COMMUNITY): Payer: Medicare HMO

## 2022-05-19 DIAGNOSIS — M1612 Unilateral primary osteoarthritis, left hip: Secondary | ICD-10-CM | POA: Diagnosis not present

## 2022-05-19 DIAGNOSIS — Z043 Encounter for examination and observation following other accident: Secondary | ICD-10-CM | POA: Diagnosis not present

## 2022-05-19 DIAGNOSIS — S161XXA Strain of muscle, fascia and tendon at neck level, initial encounter: Secondary | ICD-10-CM | POA: Diagnosis not present

## 2022-05-19 DIAGNOSIS — R0781 Pleurodynia: Secondary | ICD-10-CM | POA: Diagnosis not present

## 2022-05-19 DIAGNOSIS — S0990XA Unspecified injury of head, initial encounter: Secondary | ICD-10-CM | POA: Diagnosis not present

## 2022-05-19 MED ORDER — IBUPROFEN 400 MG PO TABS
600.0000 mg | ORAL_TABLET | Freq: Once | ORAL | Status: AC
  Administered 2022-05-19: 600 mg via ORAL
  Filled 2022-05-19: qty 1

## 2022-05-19 MED ORDER — HYDROCODONE-ACETAMINOPHEN 5-325 MG PO TABS
1.0000 | ORAL_TABLET | Freq: Once | ORAL | Status: AC
  Administered 2022-05-19: 1 via ORAL
  Filled 2022-05-19: qty 1

## 2022-05-19 MED ORDER — HYDROCODONE-ACETAMINOPHEN 5-325 MG PO TABS: 1.0000 | ORAL_TABLET | ORAL | 0 refills | Status: DC | PRN

## 2022-05-19 NOTE — Discharge Instructions (Signed)
Take ibuprofen and Tylenol for pain.  Do not combine this with other NSAIDs such as meloxicam, Advil, Motrin, ibuprofen, etc.  You are being given hydrocodone for breakthrough pain if these are not helping.

## 2022-05-19 NOTE — ED Notes (Signed)
Pt denies numbness and tingling, besides the neuropathy in feet at baseline. Pt is A&Ox4. Pt was able to pivot from chair to stretcher w/assistx2

## 2022-05-19 NOTE — ED Notes (Signed)
Patient transported to X-ray 

## 2022-05-19 NOTE — ED Provider Notes (Signed)
[facility] EMERGENCY DEPARTMENT Provider Note   CSN: [id number] Arrival date & time: 05/18/22  1425     History  Chief Complaint  Patient presents with   Kenneth Joseph is a 66 y.o. male.  HPI 66 year old male with a history of diabetes and peripheral neuropathy presents with fall.  He states that on 12/17 he fell and went to urgent care on 12/18.  He was diagnosed with what sounds like a left fibula fracture and a medial ankle sprain per his report.  He was placed in a boot and told to take Tylenol for pain.  On 12/19 he was getting out of bed and due to his neuropathy he did not realize he had put his leg on the ground and thinks his leg gave out from under him due to pain.  He fell.  He struck his head and neck though never lost consciousness.  He still has a little bit of right-sided neck pain.  He also has left shoulder pain (posterior), left chest wall pain and left iliac crest pain.  All of these pains are a little improved since he has been waiting in the waiting room for 17+ hours.  However at first when they try to get in for x-rays he was hurting too bad to be moved and so the x-ray techs deferred this until he was seen in the emergency department.  Home Medications Prior to Admission medications   Medication Sig Start Date End Date Taking? Authorizing Provider  HYDROcodone-acetaminophen (NORCO) 5-325 MG tablet Take 1 tablet by mouth every 4 (four) hours as needed for severe pain. 05/19/22  Yes Sherwood Gambler, MD  amLODipine-olmesartan (AZOR) 10-40 MG tablet Take 0.5 tablets by mouth in the morning.    [provider]  atorvastatin (LIPITOR) 80 MG tablet Take 80 mg by mouth daily at 6 PM.    [provider]  DOXYCYCLINE CALCIUM PO Take by mouth 2 (two) times daily. Patient unsure of dose Patient not taking: Reported on 01/24/2022    [provider]  glipiZIDE-metformin (METAGLIP) 5-500 MG tablet Take 1 tablet by mouth See  admin instructions. Take 1 tab in the morning and 0.5 tab in the evening Patient not taking: Reported on 01/24/2022 07/15/21   [provider]  meloxicam (MOBIC) 15 MG tablet Take 15 mg by mouth daily. 01/09/22   [provider]  methocarbamol (ROBAXIN) 750 MG tablet Take 1 tablet (750 mg total) by mouth every 6 (six) hours as needed for muscle spasms. Patient not taking: Reported on 01/24/2022 10/21/21   Justice Britain, PA-C  Vitamin D, Ergocalciferol, (DRISDOL) 1.25 MG (50000 UNIT) CAPS capsule Take 50,000 Units by mouth 2 (two) times a week. 08/17/21   [provider]      Allergies    Patient has no known allergies.    Review of Systems   Review of Systems  Cardiovascular:  Positive for chest pain.  Gastrointestinal:  Negative for abdominal pain and vomiting.  Musculoskeletal:  Positive for arthralgias and neck pain.  Neurological:  Negative for syncope and headaches.    Physical Exam Updated Vital Signs BP 129/88   Pulse 95   Temp 98.3 F (36.8 C) (Oral)   Resp 16   Ht 5\' 10"  (1.778 m)   Wt 80 kg   SpO2 95%   BMI 25.31 kg/m  Physical Exam Vitals and nursing note reviewed.  Constitutional:      Appearance: He is well-developed.  HENT:     Head: Normocephalic and atraumatic.  Neck:   Cardiovascular:     Rate and Rhythm: Normal rate and regular rhythm.     Heart sounds: Normal heart sounds.  Pulmonary:     Effort: Pulmonary effort is normal.     Breath sounds: Normal breath sounds.  Chest:     Chest wall: Tenderness present.    Abdominal:     General: There is no distension.     Palpations: Abdomen is soft.     Tenderness: There is no abdominal tenderness.  Musculoskeletal:     Left shoulder: Tenderness (mild) present. No swelling or deformity. Normal range of motion.       Arms:     Cervical back: Normal range of motion and neck supple. Muscular tenderness present. No spinous process tenderness.     Thoracic back: No tenderness.      Lumbar back: No tenderness.     Left hip: No tenderness. Normal range of motion.       Legs:     Comments: Left leg with boot.  Skin:    General: Skin is warm and dry.  Neurological:     Mental Status: He is alert.     ED Results / Procedures / Treatments   Labs (all labs ordered are listed, but only abnormal results are displayed) Labs Reviewed  BASIC METABOLIC PANEL - Abnormal; Notable for the following components:      Result Value   Glucose, Bld 111 (*)    Calcium 8.1 (*)    All other components within normal limits  CBC WITH DIFFERENTIAL/PLATELET - Abnormal; Notable for the following components:   RBC 3.67 (*)    HCT 37.8 (*)    MCV 103.0 (*)    MCH 36.2 (*)    Neutro Abs 7.8 (*)    All other components within normal limits    EKG EKG Interpretation  Date/Time:  Wednesday May 18 2022 14:42:02 EST Ventricular Rate:  114 PR Interval:  136 QRS Duration: 72 QT Interval:  312 QTC Calculation: 430 R Axis:   -78 Text Interpretation: Sinus tachycardia Left axis deviation Inferior infarct , age undetermined Anterolateral infarct , age undetermined  overall similar but with faster rate when compared to May 2023 Confirmed by Pricilla Loveless [phone]) on 05/19/2022 8:17:32 AM  Radiology DG Hip Unilat W or Wo Pelvis 2-3 Views Left  Result Date: 05/19/2022 CLINICAL DATA:  Fall, left groin pain EXAM: DG HIP (WITH OR WITHOUT PELVIS) 2-3V LEFT COMPARISON:  None Available. FINDINGS: There is no evidence of acute fracture. Alignment is normal. There is mild left hip osteoarthritis. Degenerative changes of the sacroiliac joints and pubic symphysis. Mild degenerative change of the right hip. IMPRESSION: No evidence of acute fracture. Mild left hip osteoarthritis. Electronically Signed   By: Caprice Renshaw M.D.   On: 05/19/2022 09:06   DG Ribs Unilateral W/Chest Left  Result Date: 05/19/2022 CLINICAL DATA:  Fall, lateral left rib pain EXAM: LEFT RIBS AND CHEST - 3+ VIEW COMPARISON:   None Available. FINDINGS: Cardiomediastinal silhouette is unchanged. No focal airspace disease. No pleural effusion or evidence of pneumothorax. There is no acute osseous abnormality. Specifically, no evidence of displaced rib fracture. There is a compression deformity of T12, which is unchanged since March 2023. IMPRESSION: No evidence of displaced rib fracture. No acute cardiopulmonary disease. Chronic compression deformity of T12. Electronically Signed   By: Caprice Renshaw M.D.   On: 05/19/2022 09:01   DG  Shoulder Left  Result Date: 05/19/2022 CLINICAL DATA:  Fall EXAM: LEFT SHOULDER - 2+ VIEW COMPARISON:  None Available. FINDINGS: There is no evidence of acute fracture. Alignment is normal. There is mild glenohumeral and moderate AC joint osteoarthritis. IMPRESSION: No acute osseous abnormality. Mild glenohumeral and moderate AC joint osteoarthritis. Electronically Signed   By: Maurine Simmering M.D.   On: 05/19/2022 08:57   CT Head Wo Contrast  Result Date: 05/18/2022 CLINICAL DATA:  Head trauma, moderate to severe EXAM: CT HEAD WITHOUT CONTRAST CT CERVICAL SPINE WITHOUT CONTRAST TECHNIQUE: Multidetector CT imaging of the head and cervical spine was performed following the standard protocol without intravenous contrast. Multiplanar CT image reconstructions of the cervical spine were also generated. RADIATION DOSE REDUCTION: This exam was performed according to the departmental dose-optimization program which includes automated exposure control, adjustment of the mA and/or kV according to patient size and/or use of iterative reconstruction technique. COMPARISON:  None Available. FINDINGS: CT HEAD FINDINGS Brain: No acute intracranial hemorrhage. No focal mass lesion. No CT evidence of acute infarction. No midline shift or mass effect. No hydrocephalus. Basilar cisterns are patent. Vascular: No hyperdense vessel or unexpected calcification. Skull: Normal. Negative for fracture or focal lesion. Sinuses/Orbits:  Paranasal sinuses and mastoid air cells are clear. Orbits are clear. Other: None. CT CERVICAL SPINE FINDINGS Alignment: Normal alignment of the cervical vertebral bodies. Anterior cervical fusion at C4-C5. Skull base and vertebrae: Normal craniocervical junction. No loss of vertebral body height or disc height. Normal facet articulation. No evidence of fracture. Soft tissues and spinal canal: No prevertebral soft tissue swelling. No perispinal or epidural hematoma. Disc levels:  Unremarkable Upper chest: Clear Other: None IMPRESSION: 1. No intracranial trauma. 2. No cervical spine fracture. 3. Anterior cervical fusion without complication. Electronically Signed   By: Suzy Bouchard M.D.   On: 05/18/2022 16:05   CT Cervical Spine Wo Contrast  Result Date: 05/18/2022 CLINICAL DATA:  Head trauma, moderate to severe EXAM: CT HEAD WITHOUT CONTRAST CT CERVICAL SPINE WITHOUT CONTRAST TECHNIQUE: Multidetector CT imaging of the head and cervical spine was performed following the standard protocol without intravenous contrast. Multiplanar CT image reconstructions of the cervical spine were also generated. RADIATION DOSE REDUCTION: This exam was performed according to the departmental dose-optimization program which includes automated exposure control, adjustment of the mA and/or kV according to patient size and/or use of iterative reconstruction technique. COMPARISON:  None Available. FINDINGS: CT HEAD FINDINGS Brain: No acute intracranial hemorrhage. No focal mass lesion. No CT evidence of acute infarction. No midline shift or mass effect. No hydrocephalus. Basilar cisterns are patent. Vascular: No hyperdense vessel or unexpected calcification. Skull: Normal. Negative for fracture or focal lesion. Sinuses/Orbits: Paranasal sinuses and mastoid air cells are clear. Orbits are clear. Other: None. CT CERVICAL SPINE FINDINGS Alignment: Normal alignment of the cervical vertebral bodies. Anterior cervical fusion at C4-C5.  Skull base and vertebrae: Normal craniocervical junction. No loss of vertebral body height or disc height. Normal facet articulation. No evidence of fracture. Soft tissues and spinal canal: No prevertebral soft tissue swelling. No perispinal or epidural hematoma. Disc levels:  Unremarkable Upper chest: Clear Other: None IMPRESSION: 1. No intracranial trauma. 2. No cervical spine fracture. 3. Anterior cervical fusion without complication. Electronically Signed   By: Suzy Bouchard M.D.   On: 05/18/2022 16:05    Procedures Procedures    Medications Ordered in ED Medications  acetaminophen (TYLENOL) tablet 1,000 mg (1,000 mg Oral Given 05/19/22 0011)  HYDROcodone-acetaminophen (NORCO/VICODIN) 5-325 MG per  tablet 1 tablet (1 tablet Oral Given 05/19/22 0824)  ibuprofen (ADVIL) tablet 600 mg (600 mg Oral Given 05/19/22 [id number])    ED Course/ Medical Decision Making/ A&P                           Medical Decision Making Amount and/or Complexity of Data Reviewed Labs:     Details: No significant electrolyte disturbance or renal failure. Radiology: ordered and independent interpretation performed.    Details: No head bleed on head CT. No fractures on xrays. ECG/medicine tests: independent interpretation performed.    Details: No acute ischemia.  Risk Prescription drug management.   No fractures or significant injury noted on workup today.  Labs had been obtained in triage and were unremarkable.  I suspect that his fall was primarily due to pain in his left ankle from his previous injury.  Will give a short course of pain medicine and recommend NSAIDs and Tylenol as well.  Otherwise I do not think he needs repeat imaging of his ankle.  He has already been referred to orthopedics.  At this point, he appears stable for discharge and will give crutches to help assist him in walking.        Final Clinical Impression(s) / ED Diagnoses Final diagnoses:  Fall, initial encounter  Minor head  injury, initial encounter  Strain of neck muscle, initial encounter    Rx / DC Orders ED Discharge Orders          Ordered    HYDROcodone-acetaminophen (NORCO) 5-325 MG tablet  Every 4 hours PRN        05/19/22 1002              Sherwood Gambler, MD 05/19/22 1537

## 2022-05-26 DIAGNOSIS — E538 Deficiency of other specified B group vitamins: Secondary | ICD-10-CM | POA: Diagnosis not present

## 2022-05-26 DIAGNOSIS — S161XXA Strain of muscle, fascia and tendon at neck level, initial encounter: Secondary | ICD-10-CM | POA: Diagnosis not present

## 2022-05-26 DIAGNOSIS — R296 Repeated falls: Secondary | ICD-10-CM | POA: Diagnosis not present

## 2022-05-26 DIAGNOSIS — S20212A Contusion of left front wall of thorax, initial encounter: Secondary | ICD-10-CM | POA: Diagnosis not present

## 2022-05-26 DIAGNOSIS — S82402A Unspecified fracture of shaft of left fibula, initial encounter for closed fracture: Secondary | ICD-10-CM | POA: Diagnosis not present

## 2022-05-27 DIAGNOSIS — M25572 Pain in left ankle and joints of left foot: Secondary | ICD-10-CM | POA: Diagnosis not present

## 2022-06-28 DIAGNOSIS — E1169 Type 2 diabetes mellitus with other specified complication: Secondary | ICD-10-CM | POA: Diagnosis not present

## 2022-06-28 DIAGNOSIS — E1165 Type 2 diabetes mellitus with hyperglycemia: Secondary | ICD-10-CM | POA: Diagnosis not present

## 2022-06-28 DIAGNOSIS — I700 Atherosclerosis of aorta: Secondary | ICD-10-CM | POA: Diagnosis not present

## 2022-06-28 DIAGNOSIS — R7989 Other specified abnormal findings of blood chemistry: Secondary | ICD-10-CM | POA: Diagnosis not present

## 2022-06-28 DIAGNOSIS — S82892D Other fracture of left lower leg, subsequent encounter for closed fracture with routine healing: Secondary | ICD-10-CM | POA: Diagnosis not present

## 2022-06-28 DIAGNOSIS — E538 Deficiency of other specified B group vitamins: Secondary | ICD-10-CM | POA: Diagnosis not present

## 2022-06-28 DIAGNOSIS — I10 Essential (primary) hypertension: Secondary | ICD-10-CM | POA: Diagnosis not present

## 2022-06-28 DIAGNOSIS — E7800 Pure hypercholesterolemia, unspecified: Secondary | ICD-10-CM | POA: Diagnosis not present

## 2022-07-01 DIAGNOSIS — M25572 Pain in left ankle and joints of left foot: Secondary | ICD-10-CM | POA: Diagnosis not present

## 2022-07-28 DIAGNOSIS — E538 Deficiency of other specified B group vitamins: Secondary | ICD-10-CM | POA: Diagnosis not present

## 2022-07-29 DIAGNOSIS — M25572 Pain in left ankle and joints of left foot: Secondary | ICD-10-CM | POA: Diagnosis not present

## 2022-08-04 DIAGNOSIS — M25572 Pain in left ankle and joints of left foot: Secondary | ICD-10-CM | POA: Diagnosis not present

## 2022-08-10 DIAGNOSIS — M25572 Pain in left ankle and joints of left foot: Secondary | ICD-10-CM | POA: Diagnosis not present

## 2022-08-25 DIAGNOSIS — E538 Deficiency of other specified B group vitamins: Secondary | ICD-10-CM | POA: Diagnosis not present

## 2022-09-07 DIAGNOSIS — M25572 Pain in left ankle and joints of left foot: Secondary | ICD-10-CM | POA: Diagnosis not present

## 2022-09-28 DIAGNOSIS — E1169 Type 2 diabetes mellitus with other specified complication: Secondary | ICD-10-CM | POA: Diagnosis not present

## 2022-09-28 DIAGNOSIS — E7800 Pure hypercholesterolemia, unspecified: Secondary | ICD-10-CM | POA: Diagnosis not present

## 2022-09-28 DIAGNOSIS — E538 Deficiency of other specified B group vitamins: Secondary | ICD-10-CM | POA: Diagnosis not present

## 2022-09-28 DIAGNOSIS — I700 Atherosclerosis of aorta: Secondary | ICD-10-CM | POA: Diagnosis not present

## 2022-09-28 DIAGNOSIS — E1142 Type 2 diabetes mellitus with diabetic polyneuropathy: Secondary | ICD-10-CM | POA: Diagnosis not present

## 2022-09-28 DIAGNOSIS — I10 Essential (primary) hypertension: Secondary | ICD-10-CM | POA: Diagnosis not present

## 2022-09-28 DIAGNOSIS — Z9181 History of falling: Secondary | ICD-10-CM | POA: Diagnosis not present

## 2022-10-06 DIAGNOSIS — G8918 Other acute postprocedural pain: Secondary | ICD-10-CM | POA: Diagnosis not present

## 2022-10-06 DIAGNOSIS — S86392A Other injury of muscle(s) and tendon(s) of peroneal muscle group at lower leg level, left leg, initial encounter: Secondary | ICD-10-CM | POA: Diagnosis not present

## 2022-10-06 DIAGNOSIS — M25872 Other specified joint disorders, left ankle and foot: Secondary | ICD-10-CM | POA: Diagnosis not present

## 2022-10-06 DIAGNOSIS — S86312A Strain of muscle(s) and tendon(s) of peroneal muscle group at lower leg level, left leg, initial encounter: Secondary | ICD-10-CM | POA: Diagnosis not present

## 2022-10-06 DIAGNOSIS — M19072 Primary osteoarthritis, left ankle and foot: Secondary | ICD-10-CM | POA: Diagnosis not present

## 2022-10-06 DIAGNOSIS — S93492A Sprain of other ligament of left ankle, initial encounter: Secondary | ICD-10-CM | POA: Diagnosis not present

## 2022-10-06 DIAGNOSIS — M7672 Peroneal tendinitis, left leg: Secondary | ICD-10-CM | POA: Diagnosis not present

## 2022-10-06 DIAGNOSIS — M25372 Other instability, left ankle: Secondary | ICD-10-CM | POA: Diagnosis not present

## 2022-10-06 DIAGNOSIS — S82892A Other fracture of left lower leg, initial encounter for closed fracture: Secondary | ICD-10-CM | POA: Diagnosis not present

## 2022-10-21 DIAGNOSIS — M25571 Pain in right ankle and joints of right foot: Secondary | ICD-10-CM | POA: Diagnosis not present

## 2022-12-16 DIAGNOSIS — M25572 Pain in left ankle and joints of left foot: Secondary | ICD-10-CM | POA: Diagnosis not present

## 2022-12-27 DIAGNOSIS — Z9181 History of falling: Secondary | ICD-10-CM | POA: Diagnosis not present

## 2022-12-27 DIAGNOSIS — M109 Gout, unspecified: Secondary | ICD-10-CM | POA: Diagnosis not present

## 2022-12-27 DIAGNOSIS — Z125 Encounter for screening for malignant neoplasm of prostate: Secondary | ICD-10-CM | POA: Diagnosis not present

## 2022-12-27 DIAGNOSIS — E119 Type 2 diabetes mellitus without complications: Secondary | ICD-10-CM | POA: Diagnosis not present

## 2022-12-27 DIAGNOSIS — Z0000 Encounter for general adult medical examination without abnormal findings: Secondary | ICD-10-CM | POA: Diagnosis not present

## 2022-12-27 DIAGNOSIS — E538 Deficiency of other specified B group vitamins: Secondary | ICD-10-CM | POA: Diagnosis not present

## 2022-12-27 DIAGNOSIS — I700 Atherosclerosis of aorta: Secondary | ICD-10-CM | POA: Diagnosis not present

## 2022-12-27 DIAGNOSIS — Z1331 Encounter for screening for depression: Secondary | ICD-10-CM | POA: Diagnosis not present

## 2022-12-27 DIAGNOSIS — E7800 Pure hypercholesterolemia, unspecified: Secondary | ICD-10-CM | POA: Diagnosis not present

## 2022-12-27 DIAGNOSIS — I10 Essential (primary) hypertension: Secondary | ICD-10-CM | POA: Diagnosis not present

## 2023-01-02 DIAGNOSIS — M25572 Pain in left ankle and joints of left foot: Secondary | ICD-10-CM | POA: Diagnosis not present

## 2023-01-06 DIAGNOSIS — M25572 Pain in left ankle and joints of left foot: Secondary | ICD-10-CM | POA: Diagnosis not present

## 2023-01-10 DIAGNOSIS — M25572 Pain in left ankle and joints of left foot: Secondary | ICD-10-CM | POA: Diagnosis not present

## 2023-01-13 DIAGNOSIS — M25572 Pain in left ankle and joints of left foot: Secondary | ICD-10-CM | POA: Diagnosis not present

## 2023-01-18 DIAGNOSIS — E538 Deficiency of other specified B group vitamins: Secondary | ICD-10-CM | POA: Diagnosis not present

## 2023-01-24 DIAGNOSIS — M25572 Pain in left ankle and joints of left foot: Secondary | ICD-10-CM | POA: Diagnosis not present

## 2023-01-27 DIAGNOSIS — M25572 Pain in left ankle and joints of left foot: Secondary | ICD-10-CM | POA: Diagnosis not present

## 2023-01-31 DIAGNOSIS — M25572 Pain in left ankle and joints of left foot: Secondary | ICD-10-CM | POA: Diagnosis not present

## 2023-02-03 DIAGNOSIS — M25572 Pain in left ankle and joints of left foot: Secondary | ICD-10-CM | POA: Diagnosis not present

## 2023-02-17 DIAGNOSIS — M25572 Pain in left ankle and joints of left foot: Secondary | ICD-10-CM | POA: Diagnosis not present

## 2023-02-20 DIAGNOSIS — E538 Deficiency of other specified B group vitamins: Secondary | ICD-10-CM | POA: Diagnosis not present

## 2023-02-23 DIAGNOSIS — M25572 Pain in left ankle and joints of left foot: Secondary | ICD-10-CM | POA: Diagnosis not present

## 2023-02-24 DIAGNOSIS — M25572 Pain in left ankle and joints of left foot: Secondary | ICD-10-CM | POA: Diagnosis not present

## 2023-03-08 DIAGNOSIS — M25572 Pain in left ankle and joints of left foot: Secondary | ICD-10-CM | POA: Diagnosis not present

## 2023-03-17 DIAGNOSIS — M25572 Pain in left ankle and joints of left foot: Secondary | ICD-10-CM | POA: Diagnosis not present

## 2023-03-22 DIAGNOSIS — E538 Deficiency of other specified B group vitamins: Secondary | ICD-10-CM | POA: Diagnosis not present

## 2023-05-01 DIAGNOSIS — M546 Pain in thoracic spine: Secondary | ICD-10-CM | POA: Diagnosis not present

## 2023-05-01 DIAGNOSIS — M542 Cervicalgia: Secondary | ICD-10-CM | POA: Diagnosis not present

## 2023-05-30 DIAGNOSIS — M542 Cervicalgia: Secondary | ICD-10-CM | POA: Diagnosis not present

## 2023-06-02 DIAGNOSIS — M542 Cervicalgia: Secondary | ICD-10-CM | POA: Diagnosis not present

## 2023-06-07 DIAGNOSIS — M542 Cervicalgia: Secondary | ICD-10-CM | POA: Diagnosis not present

## 2023-06-09 DIAGNOSIS — M542 Cervicalgia: Secondary | ICD-10-CM | POA: Diagnosis not present

## 2023-06-28 DIAGNOSIS — E119 Type 2 diabetes mellitus without complications: Secondary | ICD-10-CM | POA: Diagnosis not present

## 2023-07-31 DIAGNOSIS — E538 Deficiency of other specified B group vitamins: Secondary | ICD-10-CM | POA: Diagnosis not present

## 2023-08-31 DIAGNOSIS — E538 Deficiency of other specified B group vitamins: Secondary | ICD-10-CM | POA: Diagnosis not present

## 2023-10-02 DIAGNOSIS — E538 Deficiency of other specified B group vitamins: Secondary | ICD-10-CM | POA: Diagnosis not present

## 2023-11-02 DIAGNOSIS — E538 Deficiency of other specified B group vitamins: Secondary | ICD-10-CM | POA: Diagnosis not present

## 2023-11-15 ENCOUNTER — Ambulatory Visit: Admitting: Neurology

## 2023-11-15 VITALS — BP 104/74 | HR 100 | Ht 69.5 in | Wt 175.0 lb

## 2023-11-15 DIAGNOSIS — W19XXXA Unspecified fall, initial encounter: Secondary | ICD-10-CM

## 2023-11-15 DIAGNOSIS — R2681 Unsteadiness on feet: Secondary | ICD-10-CM | POA: Diagnosis not present

## 2023-11-15 DIAGNOSIS — R200 Anesthesia of skin: Secondary | ICD-10-CM | POA: Diagnosis not present

## 2023-11-15 DIAGNOSIS — R42 Dizziness and giddiness: Secondary | ICD-10-CM | POA: Diagnosis not present

## 2023-11-15 NOTE — Progress Notes (Signed)
 Follow-up Visit   Date: 11/15/2023    Kenneth Joseph MRN: [id number] DOB: Jun 18, 1955    Kenneth Joseph is a 68 y.o. right-handed Caucasian male with cervical myelopathy due to disc herniation at C4-5 s/p decompression with fusion, diabetes mellitus type 2 (HbA1c 5.6), hypertension, and hyperlipidemia returning to the clinic with new complaints of dizziness.  The patient was accompanied to the clinic by son who also provides collateral information.    IMPRESSION/PLAN: Dizziness, most consistent with vertigo, following fall.   - CT head without contrast  - Vestibular therapy   - Start meclizine  25mg  three times daily as needed  2.  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  He is not interested in cutting back or abstaining. Diabetes is well controlled.   - NCS/EMG of the left arm and leg was declined  - PT for balance and strength testing was declined  - He is compliant with using walker and rollator  Return to clinic as needed  --------------------------------------------- History of present illness: Starting around March 2023, began having numbness in the back, arms, chest, lower legs and feet.  He was found to have C4-5 disc herniation resulting in spinal cord compression and underwent decompression and fusion in May.  Following surgery, numbness involving the chest, back and arms has improved.  However, he continues to have tingling in the fingertips and numbness lower legs.  Balance is fair.  He has fallen twice in the past year. He is concerned about having neuropathy.    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  Nonsmoker.  He lives in two level home with nephew.    UPDATE 11/15/2023:  He reports having a fall back while trying to pick something up off the floor. There was no loss of consciousness.  He was able to stand up after pulling up on his sofa.  Since this fall, he has dizziness which is triggered when he tries to lay down or with change  in head position.    Medications:  Current Outpatient Medications on File Prior to Visit  Medication Sig Dispense Refill   amLODipine-olmesartan (AZOR) 10-40 MG tablet Take 0.5 tablets by mouth in the morning.     atorvastatin (LIPITOR) 80 MG tablet Take 80 mg by mouth daily at 6 PM.     DOXYCYCLINE CALCIUM PO Take by mouth 2 (two) times daily. Patient unsure of dose     glipiZIDE-metformin (METAGLIP) 5-500 MG tablet Take 1 tablet by mouth See admin instructions. Take 1 tab in the morning and 0.5 tab in the evening     HYDROcodone -acetaminophen  (NORCO) 5-325 MG tablet Take 1 tablet by mouth every 4 (four) hours as needed for severe pain. 10 tablet 0   meloxicam (MOBIC) 15 MG tablet Take 15 mg by mouth daily.     methocarbamol  (ROBAXIN ) 750 MG tablet Take 1 tablet (750 mg total) by mouth every 6 (six) hours as needed for muscle spasms. 60 tablet 0   Vitamin D, Ergocalciferol, (DRISDOL) 1.25 MG (50000 UNIT) CAPS capsule Take 50,000 Units by mouth 2 (two) times a week. (Patient not taking: Reported on 11/15/2023)     No current facility-administered medications on file prior to visit.    Allergies: No Known Allergies  Vital Signs:  BP 104/74   Pulse 100   Ht 5' 9.5 (1.765 m)   Wt 175 lb (79.4 kg)   SpO2 96%   BMI 25.47 kg/m   Neurological Exam: MENTAL STATUS  including orientation to time, place, person, recent and remote memory, attention span and concentration, language, and fund of knowledge is normal.  Speech is not dysarthric.  CRANIAL NERVES:   Pupils equal round and reactive to light.  Normal conjugate, extra-ocular eye movements in all directions of gaze.  Mild nystagmus with gaze to the right. No ptosis.  Face is symmetric.   MOTOR:  No atrophy, fasciculations or abnormal movements.  No pronator drift.  Tone is normal.   Upper Extremity:  Right   Left  Deltoid  5/5    5/5   Biceps  5/5    5/5   Triceps  5/5    5/5   Infraspinatus 5/5   5/5  Medial pectoralis 5/5   5/5   Wrist extensors  5/5    5/5   Wrist flexors  5/5    5/5   Finger extensors  5/5    5/5   Finger flexors  5/5    5/5   Dorsal interossei  4/5    4/5   Tone (Ashworth scale)  0   0    Lower Extremity:  Right   Left  Hip flexors  5/5    5/5   Hip extensors  5/5    5/5   Adductor 5/5   5/5  Abductor 5/5   5/5  Knee flexors  5/5    5/5   Knee extensors  5-/5    5-/5   Dorsiflexors  5-/5    5-/5   Plantarflexors  5-/5    5-/5   Toe extensors  5-/5    5-/5   Toe flexors  5-/5    5-/5   Tone (Ashworth scale)  0   0   MSRs:  Reflexes are 2+/4 throughout, except 1+/4 at the knees and absent at the ankles.  SENSORY:  Intact to vibration throughout, except below the ankles.  Temperature intact throughout.  COORDINATION/GAIT:  Normal finger-to- nose-finger. Gait is wide-based, assisted with a cane, stable.   Data: CT head and cervical spine 05/18/2022: 1. No intracranial trauma. 2. No cervical spine fracture. 3. Anterior cervical fusion without complication.   Thank you for allowing me to participate in patient's care.  If I can answer any additional questions, I would be pleased to do so.    Sincerely,    Saraiah Bhat K. Lydia Sams, DO

## 2023-11-16 ENCOUNTER — Encounter: Payer: Self-pay | Admitting: Neurology

## 2023-11-22 ENCOUNTER — Ambulatory Visit
Admission: RE | Admit: 2023-11-22 | Discharge: 2023-11-22 | Disposition: A | Discharge status: Home / Self Care | Source: Ambulatory Visit | Attending: Neurology | Admitting: Neurology

## 2023-11-22 DIAGNOSIS — R42 Dizziness and giddiness: Secondary | ICD-10-CM

## 2023-11-22 DIAGNOSIS — W19XXXA Unspecified fall, initial encounter: Secondary | ICD-10-CM

## 2023-11-22 DIAGNOSIS — R2681 Unsteadiness on feet: Secondary | ICD-10-CM

## 2023-11-22 DIAGNOSIS — R200 Anesthesia of skin: Secondary | ICD-10-CM

## 2023-11-22 DIAGNOSIS — S0990XA Unspecified injury of head, initial encounter: Secondary | ICD-10-CM | POA: Diagnosis not present

## 2023-11-27 ENCOUNTER — Ambulatory Visit: Payer: Self-pay | Admitting: Neurology

## 2023-11-27 ENCOUNTER — Telehealth: Payer: Self-pay | Admitting: Neurology

## 2023-11-27 NOTE — Telephone Encounter (Signed)
 Patient wants to get the results fo the Ct SCAN  that was done 11-22-23   Still feeling Dizzy and arms and legs have no feeling. If he lays down if heads hits a certain angle then the vertigo comes on

## 2023-11-29 NOTE — Telephone Encounter (Signed)
 Called patient and informed him of results of CT. Patient verbalized understanding and had no further questions or concerns.

## 2023-11-29 NOTE — Telephone Encounter (Signed)
 See result note. Patient has been notified of results. Patient verbalized understanding na dn had of further questions or concerns.

## 2023-12-08 ENCOUNTER — Telehealth: Payer: Self-pay | Admitting: Neurology

## 2023-12-08 NOTE — Telephone Encounter (Signed)
 Pt. Needs appt was just seen in June, went to PCP and he was told to contact us  Legs lose strength and power and then he falls.  Already had CT scan and has not started Physical Therapy

## 2023-12-12 NOTE — Telephone Encounter (Signed)
 Ok to place on wait list for follow-up.

## 2023-12-21 DIAGNOSIS — E538 Deficiency of other specified B group vitamins: Secondary | ICD-10-CM | POA: Diagnosis not present

## 2023-12-27 ENCOUNTER — Ambulatory Visit: Admitting: Neurology

## 2024-02-01 DIAGNOSIS — D7589 Other specified diseases of blood and blood-forming organs: Secondary | ICD-10-CM | POA: Diagnosis not present

## 2024-02-01 DIAGNOSIS — I10 Essential (primary) hypertension: Secondary | ICD-10-CM | POA: Diagnosis not present

## 2024-02-01 DIAGNOSIS — E1169 Type 2 diabetes mellitus with other specified complication: Secondary | ICD-10-CM | POA: Diagnosis not present

## 2024-02-01 DIAGNOSIS — Z1331 Encounter for screening for depression: Secondary | ICD-10-CM | POA: Diagnosis not present

## 2024-02-01 DIAGNOSIS — Z125 Encounter for screening for malignant neoplasm of prostate: Secondary | ICD-10-CM | POA: Diagnosis not present

## 2024-02-01 DIAGNOSIS — E7800 Pure hypercholesterolemia, unspecified: Secondary | ICD-10-CM | POA: Diagnosis not present

## 2024-02-01 DIAGNOSIS — I700 Atherosclerosis of aorta: Secondary | ICD-10-CM | POA: Diagnosis not present

## 2024-02-01 DIAGNOSIS — M109 Gout, unspecified: Secondary | ICD-10-CM | POA: Diagnosis not present

## 2024-02-01 DIAGNOSIS — Z0000 Encounter for general adult medical examination without abnormal findings: Secondary | ICD-10-CM | POA: Diagnosis not present

## 2024-02-01 DIAGNOSIS — E538 Deficiency of other specified B group vitamins: Secondary | ICD-10-CM | POA: Diagnosis not present

## 2024-02-06 ENCOUNTER — Ambulatory Visit: Admitting: Neurology

## 2024-02-06 ENCOUNTER — Telehealth: Payer: Self-pay

## 2024-02-06 DIAGNOSIS — E1169 Type 2 diabetes mellitus with other specified complication: Secondary | ICD-10-CM

## 2024-02-14 ENCOUNTER — Telehealth: Payer: Self-pay | Admitting: *Deleted

## 2024-02-14 NOTE — Progress Notes (Unsigned)
 Complex Care Management Note Care Guide Note  02/14/2024 Name: Kenneth Joseph MRN: [id number] DOB: [birth year]   Complex Care Management Outreach Attempts: An unsuccessful telephone outreach was attempted today to offer the patient information about available complex care management services.  Follow Up Plan:  Additional outreach attempts will be made to offer the patient complex care management information and services.   Encounter Outcome:  No Answer  Harlene Satterfield  [facility]  [facility], [facility] Guide  Direct Dial: [phone]  Fax [phone]

## 2024-02-15 NOTE — Progress Notes (Signed)
 Complex Care Management Note Care Guide Note  02/15/2024 Name: Kenneth Joseph MRN: [id number] DOB: 09/15/1955   Complex Care Management Outreach Attempts: A second unsuccessful outreach was attempted today to offer the patient with information about available complex care management services.  Follow Up Plan:  Additional outreach attempts will be made to offer the patient complex care management information and services.   Encounter Outcome:  No Answer  Harlene Satterfield  [facility]  [facility], [facility] Guide  Direct Dial: [phone]  Fax [phone]

## 2024-02-15 NOTE — Progress Notes (Signed)
 Complex Care Management Note  Care Guide Note 02/15/2024 Name: Rinaldo Macqueen MRN: [id number] DOB: 14-Feb-1956  Korver Graybeal is a 68 y.o. year old male who sees Claudene Pellet, MD for primary care. I reached out to Alfonse Mages by phone today to offer complex care management services.  Mr. Zimmerle was given information about Complex Care Management services today including:   The Complex Care Management services include support from the care team which includes your Nurse Care Manager, Clinical Social Worker, or Pharmacist.  The Complex Care Management team is here to help remove barriers to the health concerns and goals most important to you. Complex Care Management services are voluntary, and the patient may decline or stop services at any time by request to their care team member.   Complex Care Management Consent Status: Patient agreed to services and verbal consent obtained.   Follow up plan:  Telephone appointment with complex care management team member scheduled for:  02/19/24 RNCM and BSW 02/20/24  Encounter Outcome:  Patient Scheduled  Harlene Satterfield  [facility]  [facility], [facility] Guide  Direct Dial: [phone]  Fax [phone]

## 2024-02-19 ENCOUNTER — Other Ambulatory Visit: Payer: Self-pay

## 2024-02-19 DIAGNOSIS — R630 Anorexia: Secondary | ICD-10-CM | POA: Insufficient documentation

## 2024-02-19 DIAGNOSIS — R7989 Other specified abnormal findings of blood chemistry: Secondary | ICD-10-CM | POA: Insufficient documentation

## 2024-02-19 DIAGNOSIS — Z789 Other specified health status: Secondary | ICD-10-CM | POA: Insufficient documentation

## 2024-02-19 DIAGNOSIS — E1169 Type 2 diabetes mellitus with other specified complication: Secondary | ICD-10-CM | POA: Insufficient documentation

## 2024-02-19 DIAGNOSIS — I700 Atherosclerosis of aorta: Secondary | ICD-10-CM | POA: Insufficient documentation

## 2024-02-19 DIAGNOSIS — E7800 Pure hypercholesterolemia, unspecified: Secondary | ICD-10-CM | POA: Insufficient documentation

## 2024-02-19 DIAGNOSIS — R790 Abnormal level of blood mineral: Secondary | ICD-10-CM | POA: Insufficient documentation

## 2024-02-19 DIAGNOSIS — E119 Type 2 diabetes mellitus without complications: Secondary | ICD-10-CM | POA: Insufficient documentation

## 2024-02-19 DIAGNOSIS — K5730 Diverticulosis of large intestine without perforation or abscess without bleeding: Secondary | ICD-10-CM | POA: Insufficient documentation

## 2024-02-19 DIAGNOSIS — D7589 Other specified diseases of blood and blood-forming organs: Secondary | ICD-10-CM | POA: Insufficient documentation

## 2024-02-19 DIAGNOSIS — R1032 Left lower quadrant pain: Secondary | ICD-10-CM | POA: Insufficient documentation

## 2024-02-19 DIAGNOSIS — M109 Gout, unspecified: Secondary | ICD-10-CM | POA: Insufficient documentation

## 2024-02-19 DIAGNOSIS — E538 Deficiency of other specified B group vitamins: Secondary | ICD-10-CM | POA: Insufficient documentation

## 2024-02-19 DIAGNOSIS — G619 Inflammatory polyneuropathy, unspecified: Secondary | ICD-10-CM | POA: Insufficient documentation

## 2024-02-19 DIAGNOSIS — G629 Polyneuropathy, unspecified: Secondary | ICD-10-CM | POA: Insufficient documentation

## 2024-02-19 DIAGNOSIS — I10 Essential (primary) hypertension: Secondary | ICD-10-CM | POA: Insufficient documentation

## 2024-02-19 NOTE — Patient Outreach (Signed)
 Complex Care Management   Visit Note  02/19/2024  Name:  Kenneth Joseph MRN: [id number] DOB: [birth year]  Situation: Referral received for Complex Care Management related to Impaired Mobility due to Neuropathy; SDOH needs for food.  I obtained verbal consent from Patient.  Visit completed with Kenneth Joseph  on the phone.  Main concern is to avoid falls; reports numbness in both hands, numbness in both legs starting from his knees down to both feet; this causes difficulty with walking, difficulty with picking up items with his fingers.   Background:   Past Medical History:  Diagnosis Date   Anemia    Arthritis    Diabetes mellitus without complication (HCC)    type 2 per patient   Hepatitis C 2001   History of hiatal hernia    History of kidney stones    Hypertension     Assessment: Patient Reported Symptoms:  Cognitive Cognitive Status: Alert and oriented to person, place, and time, Insightful and able to interpret abstract concepts, Normal speech and language skills      Neurological Neurological Review of Symptoms: Dizziness, Numbness (Numbness in both hands, and in legs starting below the knees to his feet.) Neurological Management Strategies: Coping strategies Neurological Self-Management Outcome: 3 (uncertain)  HEENT HEENT Symptoms Reported: No symptoms reported HEENT Self-Management Outcome: 4 (good)    Cardiovascular Cardiovascular Symptoms Reported: Lightheadness Does patient have uncontrolled Hypertension?: No Cardiovascular Self-Management Outcome: 4 (good) Cardiovascular Comment: PCP decreased Olmestartin 20mg  to 1/2 tablet daily due to lightheadedness.  Patient has been taking BP daily, did not have the log handy to review with RNCM today, made plans to go over on next week's call.  Respiratory Respiratory Symptoms Reported: No symptoms reported Respiratory Self-Management Outcome: 4 (good)  Endocrine Endocrine Symptoms Reported:  (A1C 6.2% 01/31/2024) Is patient  diabetic?: Yes Is patient checking blood sugars at home?: No Endocrine Self-Management Outcome: 4 (good) Endocrine Comment: Checks blood sugar once or twice Joseph week.  Takes glipizide-metformin 5/500mg  1 tab in AM, 1/2 tab in PM.  Gastrointestinal Gastrointestinal Symptoms Reported: No symptoms reported Additional Gastrointestinal Details: Bowel Plan: he has never missed Joseph day. Gastrointestinal Self-Management Outcome: 4 (good)    Genitourinary Genitourinary Symptoms Reported: No symptoms reported    Integumentary Integumentary Symptoms Reported: No symptoms reported    Musculoskeletal Musculoskelatal Symptoms Reviewed: Difficulty walking Additional Musculoskeletal Details: Has Personal Emergency Repsonse System (PERS) in place now.  Both legs below the knees and feet are numb, has difficulty walking, uses walker, or two canes.   Falls in the past year?: Yes Number of falls in past year: 2 or more Was there an injury with Fall?: Yes Fall Risk Category Calculator: 3 Patient Fall Risk Level: High Fall Risk Patient at Risk for Falls Due to: History of fall(s)  Psychosocial Psychosocial Symptoms Reported: No symptoms reported Additional Psychological Details: He doesn't have hobbies but can spend the whole day in front of the TV and be entertained.     Quality of Family Relationships: helpful, supportive Do you feel physically threatened by others?: No    02/19/2024    PHQ2-9 Depression Screening   Little interest or pleasure in doing things Not at all  Feeling down, depressed, or hopeless Not at all  PHQ-2 - Total Score 0  Trouble falling or staying asleep, or sleeping too much    Feeling tired or having little energy    Poor appetite or overeating     Feeling bad about yourself - or that you are  Joseph failure or have let yourself or your family down    Trouble concentrating on things, such as reading the newspaper or watching television    Moving or speaking so slowly that other people  could have noticed.  Or the opposite - being so fidgety or restless that you have been moving around Joseph lot more than usual    Thoughts that you would be better off dead, or hurting yourself in some way    PHQ2-9 Total Score    If you checked off any problems, how difficult have these problems made it for you to do your work, take care of things at home, or get along with other people    Depression Interventions/Treatment      There were no vitals filed for this visit.  Medications Reviewed Today     Reviewed by Kenneth Joseph LABOR, RN (Registered Nurse) on 02/19/24 at 1245  Med List Status: <None>   Medication Order Taking? Sig Documenting Provider Last Dose Status Informant  amLODipine-olmesartan (AZOR) 10-40 MG tablet [id number] Yes Take 0.5 tablets by mouth in the morning. [provider]  Active Self  atorvastatin (LIPITOR) 80 MG tablet [id number] Yes Take 80 mg by mouth daily at 6 PM. [provider]  Active Self  DOXYCYCLINE CALCIUM PO [id number]  Take by mouth 2 (two) times daily. Patient unsure of dose [provider]  Active            Med Note Kenneth Joseph   Thu Oct 21, 2021  6:24 AM) Patient unsure of dose, prescribed for knee  glipiZIDE-metformin (METAGLIP) 5-500 MG tablet [id number] Yes Take 1 tablet by mouth See admin instructions. Take 1 tab in the morning and 0.5 tab in the evening [provider]  Active Self  HYDROcodone -acetaminophen  (NORCO) 5-325 MG tablet [id number]  Take 1 tablet by mouth every 4 (four) hours as needed for severe pain. Kenneth Hamilton, MD  Active   meloxicam [facility]) 15 MG tablet [id number]  Take 15 mg by mouth daily. [provider]  Active   methocarbamol  (ROBAXIN ) 750 MG tablet [id number] Yes Take 1 tablet (750 mg total) by mouth every 6 (six) hours as needed for muscle spasms. McKenzie, Kenneth Joseph, Kenneth Joseph  Active   Vitamin D, Ergocalciferol, (DRISDOL) 1.25 MG (50000 UNIT) CAPS capsule [id number]  Take 50,000  Units by mouth 2 (two) times Joseph week.  Patient not taking: Reported on 11/15/2023   [provider]  Active Self            Recommendation:   Discussed upcoming appts:  Kenneth Joseph, BSW by phone 02/20/24 Specialty provider follow-up Neurology scheduled for 06/18/2024, he is on cancellation list for quicker appt.    Patient will take care with position changes due to lightheadedness/risk for falls, will drink more water during the day to stay hydrated.    Follow Up Plan:   Telephone follow-up in 1 week  Joseph Kenneth BSN, CCM   [facility] Population Health RN Care Manager Direct Dial: [phone]  Fax: [phone]

## 2024-02-19 NOTE — Patient Instructions (Signed)
 Visit Information  Thank you for taking time to visit with me today. Please review the attached sheet on FALL PREVENTION, just as a quick reminder of some things you can do in the home to be safe - I'm so glad to hear you have a Personal Emergency Response System in place - don't hesitate to use it for falls or if you are feeling suddenly ill.  Please don't hesitate to contact me if I can be of assistance to you before our next scheduled appointment.  Our next appointment is by telephone on Wednesday, October 1st at 10:00am. Please call the care guide team at [phone] if you need to cancel or reschedule your appointment.   Following is a copy of your care plan:   Goals Addressed             This Visit's Progress    VBCI RN Care Plan       Problems:  Chronic Disease Management support and education needs related to Impaired Mobility  Goal: Over the next 3 months the Patient will demonstrate Improved health management independence as evidenced by No Falls         Over the next 15 days, patient will attend Neurology appointment IF he is called on the cancellation list, otherwise appt is set for 06/18/24  Interventions:   Falls Interventions: Provided written and verbal education re: potential causes of falls and Fall prevention strategies Assessed for signs and symptoms of orthostatic hypotension Assessed working status of life alert bracelet and patient adherence Screening for signs and symptoms of depression related to chronic disease state  Assessed for equipment needs in the home; patient uses walker or two cane with ambulation, has stairs within the home and holds on to railings and the wall to navigate, denies need for outside ramp.  Would certainly benefit from having automatic stair lift - he recently had a fall down the stairs causing injury.     Patient Self-Care Activities:  Attend all scheduled provider appointments Call pharmacy for medication refills 3-7 days in  advance of running out of medications Call provider office for new concerns or questions  Take medications as prescribed   Attend Neurology appointment to discuss neuropathy   Plan:  Telephone follow up appointment with care management team member scheduled for:  one week.             A reminder to ALL patients/family/friends, please call the [place]  National Suicide Prevention Lifeline: 252-659-1969 or TTY: (605)073-0934 TTY [phone]) to talk to a trained counselor if you are experiencing a Mental Health or Behavioral Health Crisis or need someone to talk to.  Patient verbalizes understanding of instructions and care plan provided today and agrees to view in MyChart. Active MyChart status and patient understanding of how to access instructions and care plan via MyChart confirmed with patient.     Santana Stamp BSN, CCM [place]  [facility] Population Health RN Care Manager Direct Dial: [phone]  Fax: [phone]

## 2024-02-20 ENCOUNTER — Other Ambulatory Visit: Payer: Self-pay | Admitting: Licensed Clinical Social Worker

## 2024-02-20 NOTE — Patient Instructions (Signed)
 Visit Information  Thank you for taking time to visit with me today. Please don't hesitate to contact me if I can be of assistance to you before our next scheduled appointment.  Our next appointment is by telephone on 03/06/2024 at 1:30 pm Please call the care guide team at [phone] if you need to cancel or reschedule your appointment.   Following is a copy of your care plan:   Goals Addressed             This Visit's Progress    BSW VBCI Social Work Care Plan       Problems:   Food Insecurity   CSW Clinical Goal(s):   Over the next 2 weeks the Patient will will follow up with the mailed resources on food pantry's and FNS application  as directed by Social Work.  Interventions:  SW will email and mail information on food pantry's and FNS application and educated the patient on the GGFF app do download  Patient Goals/Self-Care Activities:  Follow up with FNS application and food pantries for community food options.  Plan:   Telephone follow up appointment with care management team member scheduled for:  03/06/2024 at 1:30 pm        Please call the Suicide and Crisis Lifeline: 988 go to [facility] [address] [place] [phone]) call 911 if you are experiencing a Mental Health or Behavioral Health Crisis or need someone to talk to.  Patient verbalizes understanding of instructions and care plan provided today and agrees to view in MyChart. Active MyChart status and patient understanding of how to access instructions and care plan via MyChart confirmed with patient.     Tobias CHARM Maranda HEDWIG, PhD [facility], [facility] Social Worker Direct Dial: [phone]  Fax: [phone]

## 2024-02-20 NOTE — Patient Outreach (Signed)
 Complex Care Management   Visit Note  02/20/2024  Name:  Kenneth Joseph MRN: [id number] DOB: [birth year]  Situation: Referral received for Complex Care Management related to SDOH Barriers:  Food insecurity I obtained verbal consent from Patient.  Visit completed with Patient  on the phone  Background:   Past Medical History:  Diagnosis Date   Anemia    Arthritis    Diabetes mellitus without complication (HCC)    type 2 per patient   Hepatitis C 2001   History of hiatal hernia    History of kidney stones    Hypertension     Assessment: Patient stated that he and his nephew live together but his nephew is never there because he works two jobs. Patient stated that his son comes over on the weekends to visit. Patient uses [employer] for transportation and receives SSI and a small pension. Food is not a huge problem but it would be good to have some extra. Patient stated that he has never applied for food stamps, SW will email and mail paper copy of the information related to FNS and food pantries. SW also educated the patient on the GGFF app.    SDOH Interventions    Flowsheet Row Patient Outreach Telephone from 02/20/2024 in [place] POPULATION HEALTH DEPARTMENT Patient Outreach Telephone from 02/19/2024 in [place] POPULATION HEALTH DEPARTMENT  SDOH Interventions    Food Insecurity Interventions Community Resources Provided  [SW will mail food pantry resoures and educated the patient on the GGFF app and FNS SW will mail the paper applicaiton] --  Housing Interventions Intervention Not Indicated Intervention Not Indicated  Transportation Interventions Intervention Not Indicated  [[employer]] --  Utilities Interventions Intervention Not Indicated --    Recommendation:   none  Follow Up Plan:   Telephone follow up appointment date/time:  03/06/2024 at 1:30 pm  Tobias CHARM Maranda HEDWIG, PhD [facility], [facility] Social Worker Direct Dial: [phone]   Fax: [phone]

## 2024-02-28 ENCOUNTER — Telehealth

## 2024-02-28 DIAGNOSIS — E538 Deficiency of other specified B group vitamins: Secondary | ICD-10-CM | POA: Diagnosis not present

## 2024-02-28 DIAGNOSIS — D7589 Other specified diseases of blood and blood-forming organs: Secondary | ICD-10-CM | POA: Diagnosis not present

## 2024-02-28 DIAGNOSIS — Z125 Encounter for screening for malignant neoplasm of prostate: Secondary | ICD-10-CM | POA: Diagnosis not present

## 2024-02-29 ENCOUNTER — Other Ambulatory Visit: Payer: Self-pay

## 2024-02-29 NOTE — Patient Outreach (Signed)
 Complex Care Management   Visit Note  02/29/2024  Name:  Kenneth Joseph MRN: [id number] DOB: 1955-11-01  Situation: Referral received for Complex Care Management related to Diabetes with Complications and Impaired Mobility, SDOH needs. I obtained verbal consent from Patient.  Visit completed with Kenneth Joseph  on the phone.  He has impaired mobility due to neuropathy.  PERS in place.  Background:   Past Medical History:  Diagnosis Date   Anemia    Arthritis    Diabetes mellitus without complication (HCC)    type 2 per patient   Hepatitis C 2001   History of hiatal hernia    History of kidney stones    Hypertension     Assessment: Patient Reported Symptoms:  Cognitive Cognitive Status: Alert and oriented to person, place, and time, Normal speech and language skills, Insightful and able to interpret abstract concepts      Neurological Neurological Review of Symptoms: Numbness (Numbness in both hands, and in legs starting below the knees to his feet, he started taking Nervive 02/21/24 and has noticed a slight improvement in numbness in legs/feet)    HEENT HEENT Symptoms Reported: Not assessed      Cardiovascular Cardiovascular Symptoms Reported: No symptoms reported Cardiovascular Comment: BP log over last two weeks: 9/14: 102/70; 9/15: 118/85; 9/16: 101/70; 9/17 134/97;  9/18: 100/73: 9/19: 141/91; 9/20 skipped; 9/21: 116/75; 9/22: 134/98; 9/23 skipped; 10/02: 111/89.   HR average of 97  Respiratory Respiratory Symptoms Reported: No symptoms reported    Endocrine Endocrine Symptoms Reported: Not assessed    Gastrointestinal Gastrointestinal Symptoms Reported: Not assessed      Genitourinary Genitourinary Symptoms Reported: Not assessed    Integumentary Integumentary Symptoms Reported: Not assessed    Musculoskeletal Additional Musculoskeletal Details: Denies falls since last assessment. PERS in place to activate if he does falls/feel ill, using rollator, being aware of his  surroundings and decreased sensation in legs/feet/hands        Psychosocial Psychosocial Symptoms Reported: Not assessed          02/29/2024    PHQ2-9 Depression Screening   Little interest or pleasure in doing things    Feeling down, depressed, or hopeless    PHQ-2 - Total Score    Trouble falling or staying asleep, or sleeping too much    Feeling tired or having little energy    Poor appetite or overeating     Feeling bad about yourself - or that you are a failure or have let yourself or your family down    Trouble concentrating on things, such as reading the newspaper or watching television    Moving or speaking so slowly that other people could have noticed.  Or the opposite - being so fidgety or restless that you have been moving around a lot more than usual    Thoughts that you would be better off dead, or hurting yourself in some way    PHQ2-9 Total Score    If you checked off any problems, how difficult have these problems made it for you to do your work, take care of things at home, or get along with other people    Depression Interventions/Treatment      There were no vitals filed for this visit.  Medications Reviewed Today     Reviewed by Lucian Santana LABOR, RN (Registered Nurse) on 02/29/24 at 0957  Med List Status: <None>   Medication Order Taking? Sig Documenting Provider Last Dose Status Informant  amLODipine-olmesartan (AZOR) 10-40 MG  tablet [id number]  Take 0.5 tablets by mouth in the morning. [provider]  Active Self  atorvastatin (LIPITOR) 80 MG tablet [id number]  Take 80 mg by mouth daily at 6 PM. [provider]  Active Self  DOXYCYCLINE CALCIUM PO [id number]  Take by mouth 2 (two) times daily. Patient unsure of dose [provider]  Active            Med Note MONTA, MEREDITH A   Thu Oct 21, 2021  6:24 AM) Patient unsure of dose, prescribed for knee  glipiZIDE-metformin (METAGLIP) 5-500 MG tablet [id number]  Take 1 tablet by  mouth See admin instructions. Take 1 tab in the morning and 0.5 tab in the evening [provider]  Active Self  HYDROcodone -acetaminophen  (NORCO) 5-325 MG tablet [id number]  Take 1 tablet by mouth every 4 (four) hours as needed for severe pain. Freddi Hamilton, MD  Active   meloxicam [facility]) 15 MG tablet [id number]  Take 15 mg by mouth daily. [provider]  Active   methocarbamol  (ROBAXIN ) 750 MG tablet [id number]  Take 1 tablet (750 mg total) by mouth every 6 (six) hours as needed for muscle spasms. McKenzie, Kayla J, PA-C  Active   Specialty Vitamins Products (NERVIVE NERVE RELIEF) TABS [id number] Yes Take by mouth. [provider]  Active   Vitamin D, Ergocalciferol, (DRISDOL) 1.25 MG (50000 UNIT) CAPS capsule [id number]  Take 50,000 Units by mouth 2 (two) times a week.  Patient not taking: Reported on 11/15/2023   [provider]  Active Self            Recommendation:   He will continue to work with Tobias Nelson/BSW for SDOH needs, next appt is 03/06/2024  Specialty provider follow-up Neurology - he is on cancellation list for first available, already scheduled for 06/18/2024 He will be having [facility] nurse visit home for assessment in the next week.   Follow Up Plan:   Telephone follow-up in 1 month  Santana Stamp BSN, CCM [place]  [facility] Population Health RN Care Manager Direct Dial: [phone]  Fax: [phone]

## 2024-02-29 NOTE — Patient Instructions (Signed)
 Visit Information  Thank you for taking time to visit with me today. Please don't hesitate to contact me if I can be of assistance to you before our next scheduled appointment.  Your next care management appointment is by telephone on Monday, November 3rd at 9:30am.    Please call the care guide team at [phone] if you need to cancel, schedule, or reschedule an appointment.   A reminder to ALL patients/family/friends, please call the [place]  National Suicide Prevention Lifeline: 984 416 3812 or TTY: (217)397-3506 TTY [phone]) to talk to a trained counselor if you are experiencing a Mental Health or Behavioral Health Crisis or need someone to talk to.  Santana Stamp BSN, CCM [place]  VBCI Population Health RN Care Manager Direct Dial: [phone]  Fax: [phone]'

## 2024-03-06 ENCOUNTER — Other Ambulatory Visit: Payer: Self-pay | Admitting: Licensed Clinical Social Worker

## 2024-03-06 ENCOUNTER — Encounter: Payer: Self-pay | Admitting: Licensed Clinical Social Worker

## 2024-03-06 NOTE — Patient Instructions (Signed)
 Alfonse Mages - I am sorry I was unable to reach you today for our scheduled appointment. I work with Claudene Pellet, MD and am calling to support your healthcare needs. Please contact me at [phone] at your earliest convenience. I look forward to speaking with you soon.   Thank you,  Tobias CHARM Maranda HEDWIG, PhD [facility] Social Worker Direct Dial: [phone]  Fax: [phone]

## 2024-03-13 ENCOUNTER — Other Ambulatory Visit: Payer: Self-pay

## 2024-03-13 ENCOUNTER — Emergency Department (HOSPITAL_COMMUNITY)

## 2024-03-13 ENCOUNTER — Encounter (HOSPITAL_COMMUNITY): Payer: Self-pay

## 2024-03-13 ENCOUNTER — Inpatient Hospital Stay (HOSPITAL_COMMUNITY)
Admission: EM | Admit: 2024-03-13 | Discharge: 2024-03-21 | DRG: 641 | Disposition: A | Discharge status: Skilled Nursing Facility | Attending: Internal Medicine | Admitting: Internal Medicine

## 2024-03-13 DIAGNOSIS — G621 Alcoholic polyneuropathy: Secondary | ICD-10-CM | POA: Diagnosis present

## 2024-03-13 DIAGNOSIS — E1169 Type 2 diabetes mellitus with other specified complication: Secondary | ICD-10-CM | POA: Diagnosis present

## 2024-03-13 DIAGNOSIS — I10 Essential (primary) hypertension: Secondary | ICD-10-CM | POA: Diagnosis present

## 2024-03-13 DIAGNOSIS — Z8249 Family history of ischemic heart disease and other diseases of the circulatory system: Secondary | ICD-10-CM

## 2024-03-13 DIAGNOSIS — Z79899 Other long term (current) drug therapy: Secondary | ICD-10-CM

## 2024-03-13 DIAGNOSIS — Z7984 Long term (current) use of oral hypoglycemic drugs: Secondary | ICD-10-CM

## 2024-03-13 DIAGNOSIS — Z823 Family history of stroke: Secondary | ICD-10-CM

## 2024-03-13 DIAGNOSIS — K529 Noninfective gastroenteritis and colitis, unspecified: Secondary | ICD-10-CM

## 2024-03-13 DIAGNOSIS — Z981 Arthrodesis status: Secondary | ICD-10-CM | POA: Diagnosis not present

## 2024-03-13 DIAGNOSIS — R42 Dizziness and giddiness: Secondary | ICD-10-CM | POA: Diagnosis not present

## 2024-03-13 DIAGNOSIS — R55 Syncope and collapse: Secondary | ICD-10-CM | POA: Diagnosis not present

## 2024-03-13 DIAGNOSIS — F1010 Alcohol abuse, uncomplicated: Secondary | ICD-10-CM | POA: Diagnosis present

## 2024-03-13 DIAGNOSIS — Z818 Family history of other mental and behavioral disorders: Secondary | ICD-10-CM

## 2024-03-13 DIAGNOSIS — D7589 Other specified diseases of blood and blood-forming organs: Secondary | ICD-10-CM | POA: Diagnosis present

## 2024-03-13 DIAGNOSIS — A049 Bacterial intestinal infection, unspecified: Secondary | ICD-10-CM | POA: Diagnosis present

## 2024-03-13 DIAGNOSIS — E538 Deficiency of other specified B group vitamins: Secondary | ICD-10-CM | POA: Diagnosis present

## 2024-03-13 DIAGNOSIS — I959 Hypotension, unspecified: Secondary | ICD-10-CM | POA: Diagnosis present

## 2024-03-13 DIAGNOSIS — E860 Dehydration: Secondary | ICD-10-CM | POA: Diagnosis not present

## 2024-03-13 DIAGNOSIS — Z833 Family history of diabetes mellitus: Secondary | ICD-10-CM

## 2024-03-13 DIAGNOSIS — E7800 Pure hypercholesterolemia, unspecified: Secondary | ICD-10-CM | POA: Diagnosis present

## 2024-03-13 DIAGNOSIS — M109 Gout, unspecified: Secondary | ICD-10-CM | POA: Diagnosis present

## 2024-03-13 DIAGNOSIS — R197 Diarrhea, unspecified: Secondary | ICD-10-CM

## 2024-03-13 DIAGNOSIS — E1142 Type 2 diabetes mellitus with diabetic polyneuropathy: Secondary | ICD-10-CM | POA: Diagnosis present

## 2024-03-13 DIAGNOSIS — E119 Type 2 diabetes mellitus without complications: Secondary | ICD-10-CM

## 2024-03-13 DIAGNOSIS — G629 Polyneuropathy, unspecified: Secondary | ICD-10-CM

## 2024-03-13 DIAGNOSIS — D696 Thrombocytopenia, unspecified: Secondary | ICD-10-CM | POA: Diagnosis present

## 2024-03-13 DIAGNOSIS — Z791 Long term (current) use of non-steroidal anti-inflammatories (NSAID): Secondary | ICD-10-CM

## 2024-03-13 DIAGNOSIS — R17 Unspecified jaundice: Secondary | ICD-10-CM | POA: Diagnosis present

## 2024-03-13 DIAGNOSIS — M79673 Pain in unspecified foot: Secondary | ICD-10-CM | POA: Diagnosis not present

## 2024-03-13 LAB — CBC WITH DIFFERENTIAL/PLATELET
Abs Immature Granulocytes: 0.05 K/uL (ref 0.00–0.07)
Basophils Absolute: 0 K/uL (ref 0.0–0.1)
Basophils Relative: 0 %
Eosinophils Absolute: 0 K/uL (ref 0.0–0.5)
Eosinophils Relative: 0 %
HCT: 40.5 % (ref 39.0–52.0)
Hemoglobin: 14 g/dL (ref 13.0–17.0)
Immature Granulocytes: 1 %
Lymphocytes Relative: 11 %
Lymphs Abs: 1.1 K/uL (ref 0.7–4.0)
MCH: 36.6 pg — ABNORMAL HIGH (ref 26.0–34.0)
MCHC: 34.6 g/dL (ref 30.0–36.0)
MCV: 106 fL — ABNORMAL HIGH (ref 80.0–100.0)
Monocytes Absolute: 1.5 K/uL — ABNORMAL HIGH (ref 0.1–1.0)
Monocytes Relative: 15 %
Neutro Abs: 7.4 K/uL (ref 1.7–7.7)
Neutrophils Relative %: 73 %
Platelets: 104 K/uL — ABNORMAL LOW (ref 150–400)
RBC: 3.82 MIL/uL — ABNORMAL LOW (ref 4.22–5.81)
RDW: 15.7 % — ABNORMAL HIGH (ref 11.5–15.5)
WBC: 10.2 K/uL (ref 4.0–10.5)
nRBC: 0 % (ref 0.0–0.2)

## 2024-03-13 LAB — I-STAT CHEM 8, ED
BUN: 9 mg/dL (ref 8–23)
Calcium, Ion: 0.91 mmol/L — ABNORMAL LOW (ref 1.15–1.40)
Chloride: 99 mmol/L (ref 98–111)
Creatinine, Ser: 0.9 mg/dL (ref 0.61–1.24)
Glucose, Bld: 139 mg/dL — ABNORMAL HIGH (ref 70–99)
HCT: 42 % (ref 39.0–52.0)
Hemoglobin: 14.3 g/dL (ref 13.0–17.0)
Potassium: 3.8 mmol/L (ref 3.5–5.1)
Sodium: 142 mmol/L (ref 135–145)
TCO2: 30 mmol/L (ref 22–32)

## 2024-03-13 LAB — URINALYSIS, ROUTINE W REFLEX MICROSCOPIC
Bilirubin Urine: NEGATIVE
Glucose, UA: NEGATIVE mg/dL
Hgb urine dipstick: NEGATIVE
Ketones, ur: NEGATIVE mg/dL
Leukocytes,Ua: NEGATIVE
Nitrite: NEGATIVE
Protein, ur: NEGATIVE mg/dL
Specific Gravity, Urine: 1.043 — ABNORMAL HIGH (ref 1.005–1.030)
pH: 6 (ref 5.0–8.0)

## 2024-03-13 MED ORDER — SODIUM CHLORIDE 0.9 % IV BOLUS
1000.0000 mL | Freq: Once | INTRAVENOUS | Status: AC
  Administered 2024-03-13: 1000 mL via INTRAVENOUS

## 2024-03-13 MED ORDER — SODIUM CHLORIDE 0.9 % IV SOLN: INTRAVENOUS | Status: DC

## 2024-03-13 MED ORDER — MORPHINE SULFATE (PF) 4 MG/ML IV SOLN
4.0000 mg | Freq: Once | INTRAVENOUS | Status: AC
  Administered 2024-03-13: 4 mg via INTRAVENOUS
  Filled 2024-03-13: qty 1

## 2024-03-13 MED ORDER — IOHEXOL 350 MG/ML SOLN
75.0000 mL | Freq: Once | INTRAVENOUS | Status: AC | PRN
  Administered 2024-03-13: 75 mL via INTRAVENOUS

## 2024-03-13 MED ORDER — ONDANSETRON HCL 4 MG/2ML IJ SOLN
4.0000 mg | Freq: Once | INTRAMUSCULAR | Status: AC
  Administered 2024-03-13: 4 mg via INTRAVENOUS
  Filled 2024-03-13: qty 2

## 2024-03-13 NOTE — ED Notes (Signed)
 Extra SST drawn

## 2024-03-13 NOTE — ED Notes (Signed)
 CCMD called to place patient on monitor

## 2024-03-13 NOTE — ED Provider Notes (Signed)
 [place] EMERGENCY DEPARTMENT AT [facility] Provider Note   CSN: [id number] Arrival date & time: 03/13/24  1308     Patient presents with: Hypotension, Bilateral Foot Pain/Neuropathy, and Diarrhea   Kenneth Joseph is a 68 y.o. male.   Pt is a 68 yo male with pmhx significant for htn, dm, peripheral neuropathy, hld, and hepatitis c.  Pt said he's been having increasing pain in his feet from his neuropathy.  He's also been having neuropathy in his hands.  He's finding it hard to hold things in his hands as he can't feel well.  Pt has also had diarrhea for the past few days.  He called EMS because he was dizzy when he walked and the pain in his feet was also bad that he could not walk much.  He was orthostatic for EMS.  No recent abx or travel.       Prior to Admission medications   Medication Sig Start Date End Date Taking? Authorizing Provider  amLODipine-olmesartan (AZOR) 10-40 MG tablet Take 0.5 tablets by mouth in the morning.    [provider]  atorvastatin (LIPITOR) 80 MG tablet Take 80 mg by mouth daily at 6 PM.    [provider]  DOXYCYCLINE CALCIUM PO Take by mouth 2 (two) times daily. Patient unsure of dose    [provider]  glipiZIDE-metformin (METAGLIP) 5-500 MG tablet Take 1 tablet by mouth See admin instructions. Take 1 tab in the morning and 0.5 tab in the evening 07/15/21   [provider]  HYDROcodone -acetaminophen  (NORCO) 5-325 MG tablet Take 1 tablet by mouth every 4 (four) hours as needed for severe pain. 05/19/22   Freddi Hamilton, MD  meloxicam (MOBIC) 15 MG tablet Take 15 mg by mouth daily. 01/09/22   [provider]  methocarbamol  (ROBAXIN ) 750 MG tablet Take 1 tablet (750 mg total) by mouth every 6 (six) hours as needed for muscle spasms. 10/21/21   McKenzie, Kayla J, PA-C  Specialty Vitamins Products (NERVIVE NERVE RELIEF) TABS Take by mouth.    [provider]  Vitamin D, Ergocalciferol,  (DRISDOL) 1.25 MG (50000 UNIT) CAPS capsule Take 50,000 Units by mouth 2 (two) times a week. Patient not taking: Reported on 11/15/2023 08/17/21   [provider]    Allergies: Patient has no known allergies.    Review of Systems  Gastrointestinal:  Positive for diarrhea.  Neurological:  Positive for weakness and numbness.  All other systems reviewed and are negative.   Updated Vital Signs Temp 99 F (37.2 C) (Oral)   SpO2 97%   Physical Exam Vitals and nursing note reviewed.  Constitutional:      Appearance: Normal appearance.  HENT:     Head: Normocephalic and atraumatic.     Right Ear: External ear normal.     Left Ear: External ear normal.     Nose: Nose normal.     Mouth/Throat:     Mouth: Mucous membranes are dry.  Eyes:     Extraocular Movements: Extraocular movements intact.     Conjunctiva/sclera: Conjunctivae normal.     Pupils: Pupils are equal, round, and reactive to light.  Cardiovascular:     Rate and Rhythm: Normal rate and regular rhythm.     Pulses: Normal pulses.     Heart sounds: Normal heart sounds.  Pulmonary:     Effort: Pulmonary effort is normal.     Breath sounds: Normal breath sounds.  Abdominal:     General: Abdomen  is flat. Bowel sounds are normal.     Palpations: Abdomen is soft.  Musculoskeletal:        General: Normal range of motion.     Cervical back: Normal range of motion and neck supple.  Skin:    General: Skin is warm.     Capillary Refill: Capillary refill takes less than 2 seconds.  Neurological:     General: No focal deficit present.     Mental Status: He is alert and oriented to person, place, and time.  Psychiatric:        Mood and Affect: Mood normal.        Behavior: Behavior normal.        Thought Content: Thought content normal.        Judgment: Judgment normal.     (all labs ordered are listed, but only abnormal results are displayed) Labs Reviewed  CBC WITH DIFFERENTIAL/PLATELET - Abnormal; Notable for  the following components:      Result Value   RBC 3.82 (*)    MCV 106.0 (*)    MCH 36.6 (*)    RDW 15.7 (*)    Platelets 104 (*)    Monocytes Absolute 1.5 (*)    All other components within normal limits  GASTROINTESTINAL PANEL BY PCR, STOOL (REPLACES STOOL CULTURE)  C DIFFICILE QUICK SCREEN W PCR REFLEX    URINALYSIS, ROUTINE W REFLEX MICROSCOPIC  COMPREHENSIVE METABOLIC PANEL WITH GFR  LIPASE, BLOOD    EKG: EKG Interpretation Date/Time:  Wednesday March 13 2024 15:42:03 EDT Ventricular Rate:  89 PR Interval:  148 QRS Duration:  73 QT Interval:  477 QTC Calculation: 581 R Axis:   -66  Text Interpretation: Sinus rhythm Inferior infarct, old Consider anterior infarct Prolonged QT interval Since last tracing rate slower Confirmed by Dean Clarity [phone]) on 03/13/2024 4:31:05 PM  Radiology: No results found.   Procedures   Medications Ordered in the ED  sodium chloride  0.9 % bolus 1,000 mL (0 mLs Intravenous Stopped 03/13/24 1631)  morphine (PF) 4 MG/ML injection 4 mg (4 mg Intravenous Given 03/13/24 1350)  ondansetron  (ZOFRAN ) injection 4 mg (4 mg Intravenous Given 03/13/24 1350)                                    Medical Decision Making Amount and/or Complexity of Data Reviewed Labs: ordered.  Risk Prescription drug management.   This patient presents to the ED for concern of diarrhea, weakness, neuropathy, this involves an extensive number of treatment options, and is a complaint that carries with it a high risk of complications and morbidity.  The differential diagnosis includes dehydration, infection, electrolyte abn   Co morbidities that complicate the patient evaluation  htn, dm, peripheral neuropathy, hld, and hepatitis c   Additional history obtained:  Additional history obtained from epic chart review External records from outside source obtained and reviewed including EMS report   Lab Tests:  I Ordered, and personally interpreted labs.   The pertinent results include:  cbc nl other than plt low at 104 (plt nl 1 year ago); other labs pending at shift change   Imaging Studies ordered:  I ordered imaging studies including cxr, ct abd/pelvis  Pending at shift change   Cardiac Monitoring:  The patient was maintained on a cardiac monitor.  I personally viewed and interpreted the cardiac monitored which showed an underlying rhythm of: nsr   Medicines ordered  and prescription drug management:  I ordered medication including ivfs  for sx  Reevaluation of the patient after these medicines showed that the patient improved I have reviewed the patients home medicines and have made adjustments as needed   Test Considered:  ct  Problem List / ED Course:  Diarrhea:  labs and ct pending   Reevaluation:  After the interventions noted above, I reevaluated the patient and found that they have :improved   Social Determinants of Health:  Lives at home   Dispostion:  Pending at shift change     Final diagnoses:  Dehydration  Diarrhea, unspecified type  Peripheral polyneuropathy  Thrombocytopenia    ED Discharge Orders     None          Dean Clarity, MD 03/13/24 1635

## 2024-03-13 NOTE — ED Triage Notes (Signed)
 Patient BIB EMS from home C/O bilateral foot pain. Patient does have a history of neuropathy and gout. Patient also C/O diarrhea for the past 4 days. Patient typically takes medication for hypertension, but has also not taken that in the past 4 days. Patient was 90/50 BP with EMS laying down and became orthostatic upon sitting up.

## 2024-03-13 NOTE — ED Provider Notes (Addendum)
 Patient still not feeling well but does feel better after the hydration.  Patient's i-STAT was normal.  White count normal.  Urinalysis negative but he was very hemoconcentrated.  CT scan abdomen and pelvis mild diffuse colonic wall thickening worrisome for nonspecific colitis there was findings suspicious for acute cholecystitis but patient has no pain in the right upper quadrant.  Abdomen is totally soft and nontender.  Portable chest x-ray without any acute findings.  Patient does feel better if hydration by think he needs to continue to have hydration.  He has not had any significant diarrhea here.  But he feels as if he is going to continue to have diarrhea.  Will contact hospitalist for admission.  Patient did receive 1 L bolus.  But will continue maintenance fluids.  Discussed with hospitalist about admission for further IV hydration.  I think patient will be better by tomorrow.  No acute abdominal process.   Lillianne Eick, MD 03/13/24 2329    Katrisha Segall, MD 03/13/24 2330

## 2024-03-14 ENCOUNTER — Observation Stay (HOSPITAL_COMMUNITY)

## 2024-03-14 ENCOUNTER — Encounter (HOSPITAL_COMMUNITY): Payer: Self-pay | Admitting: Internal Medicine

## 2024-03-14 DIAGNOSIS — G629 Polyneuropathy, unspecified: Secondary | ICD-10-CM

## 2024-03-14 DIAGNOSIS — K760 Fatty (change of) liver, not elsewhere classified: Secondary | ICD-10-CM | POA: Diagnosis not present

## 2024-03-14 DIAGNOSIS — I159 Secondary hypertension, unspecified: Secondary | ICD-10-CM | POA: Diagnosis not present

## 2024-03-14 DIAGNOSIS — R197 Diarrhea, unspecified: Secondary | ICD-10-CM

## 2024-03-14 DIAGNOSIS — F1010 Alcohol abuse, uncomplicated: Secondary | ICD-10-CM | POA: Insufficient documentation

## 2024-03-14 DIAGNOSIS — K529 Noninfective gastroenteritis and colitis, unspecified: Secondary | ICD-10-CM

## 2024-03-14 DIAGNOSIS — D696 Thrombocytopenia, unspecified: Secondary | ICD-10-CM

## 2024-03-14 DIAGNOSIS — K810 Acute cholecystitis: Secondary | ICD-10-CM | POA: Diagnosis not present

## 2024-03-14 DIAGNOSIS — E1169 Type 2 diabetes mellitus with other specified complication: Secondary | ICD-10-CM | POA: Diagnosis not present

## 2024-03-14 LAB — COMPREHENSIVE METABOLIC PANEL WITH GFR
ALT: 16 U/L (ref 0–44)
ALT: 15 U/L (ref 0–44)
AST: 27 U/L (ref 15–41)
AST: 28 U/L (ref 15–41)
Albumin: 2.3 g/dL — ABNORMAL LOW (ref 3.5–5.0)
Albumin: 2 g/dL — ABNORMAL LOW (ref 3.5–5.0)
Alkaline Phosphatase: 77 U/L (ref 38–126)
Alkaline Phosphatase: 70 U/L (ref 38–126)
Anion gap: 11 (ref 5–15)
Anion gap: 11 (ref 5–15)
BUN: 8 mg/dL (ref 8–23)
BUN: 8 mg/dL (ref 8–23)
CO2: 26 mmol/L (ref 22–32)
CO2: 24 mmol/L (ref 22–32)
Calcium: 6.9 mg/dL — ABNORMAL LOW (ref 8.9–10.3)
Calcium: 6.8 mg/dL — ABNORMAL LOW (ref 8.9–10.3)
Chloride: 102 mmol/L (ref 98–111)
Chloride: 104 mmol/L (ref 98–111)
Creatinine, Ser: 0.99 mg/dL (ref 0.61–1.24)
Creatinine, Ser: 0.98 mg/dL (ref 0.61–1.24)
GFR, Estimated: >60 mL/min (ref >60)
GFR, Estimated: >60 mL/min (ref >60)
Glucose, Bld: 131 mg/dL — ABNORMAL HIGH (ref 70–99)
Glucose, Bld: 133 mg/dL — ABNORMAL HIGH (ref 70–99)
Potassium: 3.3 mmol/L — ABNORMAL LOW (ref 3.5–5.1)
Potassium: 3.6 mmol/L (ref 3.5–5.1)
Sodium: 139 mmol/L (ref 135–145)
Sodium: 139 mmol/L (ref 135–145)
Total Bilirubin: 2 mg/dL — ABNORMAL HIGH (ref 0.0–1.2)
Total Bilirubin: 2 mg/dL — ABNORMAL HIGH (ref 0.0–1.2)
Total Protein: 6.3 g/dL — ABNORMAL LOW (ref 6.5–8.1)
Total Protein: 5.9 g/dL — ABNORMAL LOW (ref 6.5–8.1)

## 2024-03-14 LAB — HIV ANTIBODY (ROUTINE TESTING W REFLEX): HIV Screen 4th Generation wRfx: NONREACTIVE

## 2024-03-14 LAB — HEPATIC FUNCTION PANEL
ALT: 16 U/L (ref 0–44)
AST: 28 U/L (ref 15–41)
Albumin: 2.1 g/dL — ABNORMAL LOW (ref 3.5–5.0)
Alkaline Phosphatase: 78 U/L (ref 38–126)
Bilirubin, Direct: 0.6 mg/dL — ABNORMAL HIGH (ref 0.0–0.2)
Indirect Bilirubin: 1.4 mg/dL — ABNORMAL HIGH (ref 0.3–0.9)
Total Bilirubin: 2 mg/dL — ABNORMAL HIGH (ref 0.0–1.2)
Total Protein: 6.1 g/dL — ABNORMAL LOW (ref 6.5–8.1)

## 2024-03-14 LAB — HEMOGLOBIN A1C
Hgb A1c MFr Bld: 5.5 % (ref 4.8–5.6)
Mean Plasma Glucose: 111.15 mg/dL

## 2024-03-14 LAB — RPR: RPR Ser Ql: NONREACTIVE

## 2024-03-14 LAB — LIPASE, BLOOD: Lipase: 23 U/L (ref 11–51)

## 2024-03-14 LAB — CBC
HCT: 39.3 % (ref 39.0–52.0)
Hemoglobin: 13.1 g/dL (ref 13.0–17.0)
MCH: 36.4 pg — ABNORMAL HIGH (ref 26.0–34.0)
MCHC: 33.3 g/dL (ref 30.0–36.0)
MCV: 109.2 fL — ABNORMAL HIGH (ref 80.0–100.0)
Platelets: 114 K/uL — ABNORMAL LOW (ref 150–400)
RBC: 3.6 MIL/uL — ABNORMAL LOW (ref 4.22–5.81)
RDW: 15.3 % (ref 11.5–15.5)
WBC: 9.1 K/uL (ref 4.0–10.5)
nRBC: 0 % (ref 0.0–0.2)

## 2024-03-14 LAB — FOLATE: Folate: 3.1 ng/mL — ABNORMAL LOW (ref >5.9)

## 2024-03-14 LAB — TSH: TSH: 1.248 u[IU]/mL (ref 0.350–4.500)

## 2024-03-14 LAB — MAGNESIUM: Magnesium: 1.1 mg/dL — ABNORMAL LOW (ref 1.7–2.4)

## 2024-03-14 IMAGING — CR DG CERVICAL SPINE 2 OR 3 VIEWS
3 series · 3 of 3 positions shown · non-contrast
Comparison: Chest x-ray rib series 07/06/2017.

CLINICAL DATA: Thoracic spine pain.  Neck pain.  No known injury.

EXAM:
CERVICAL SPINE - 2-3 VIEW

[w cervical spine lat]
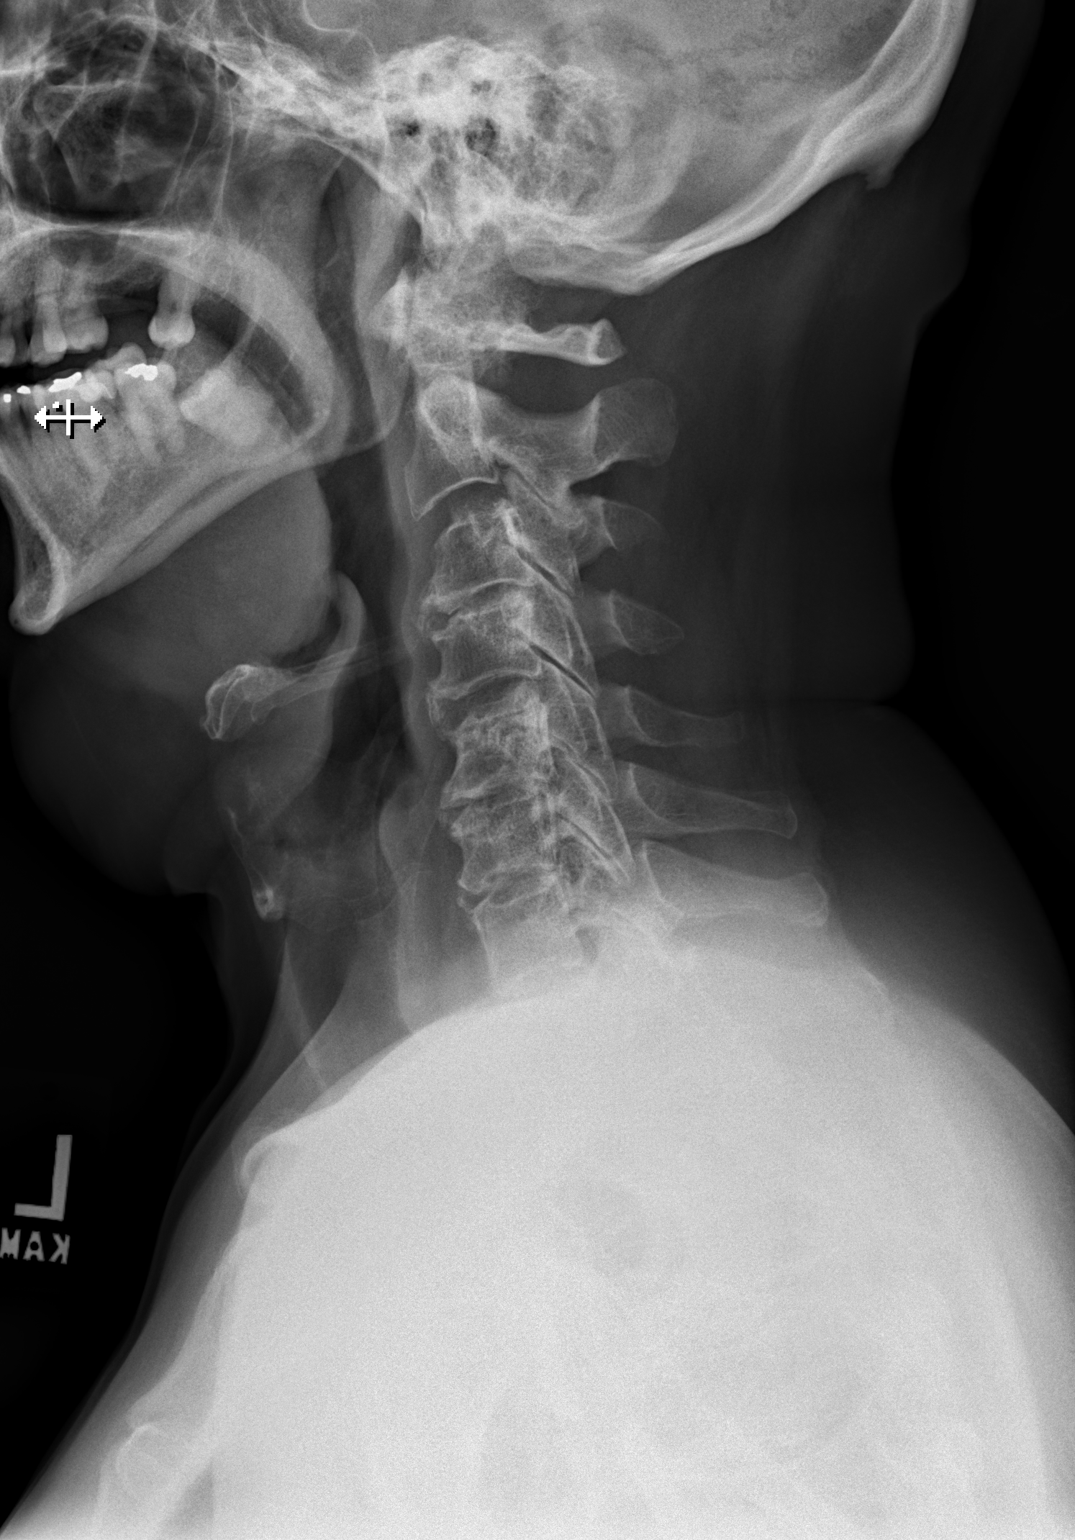

[w cervical spine ap]
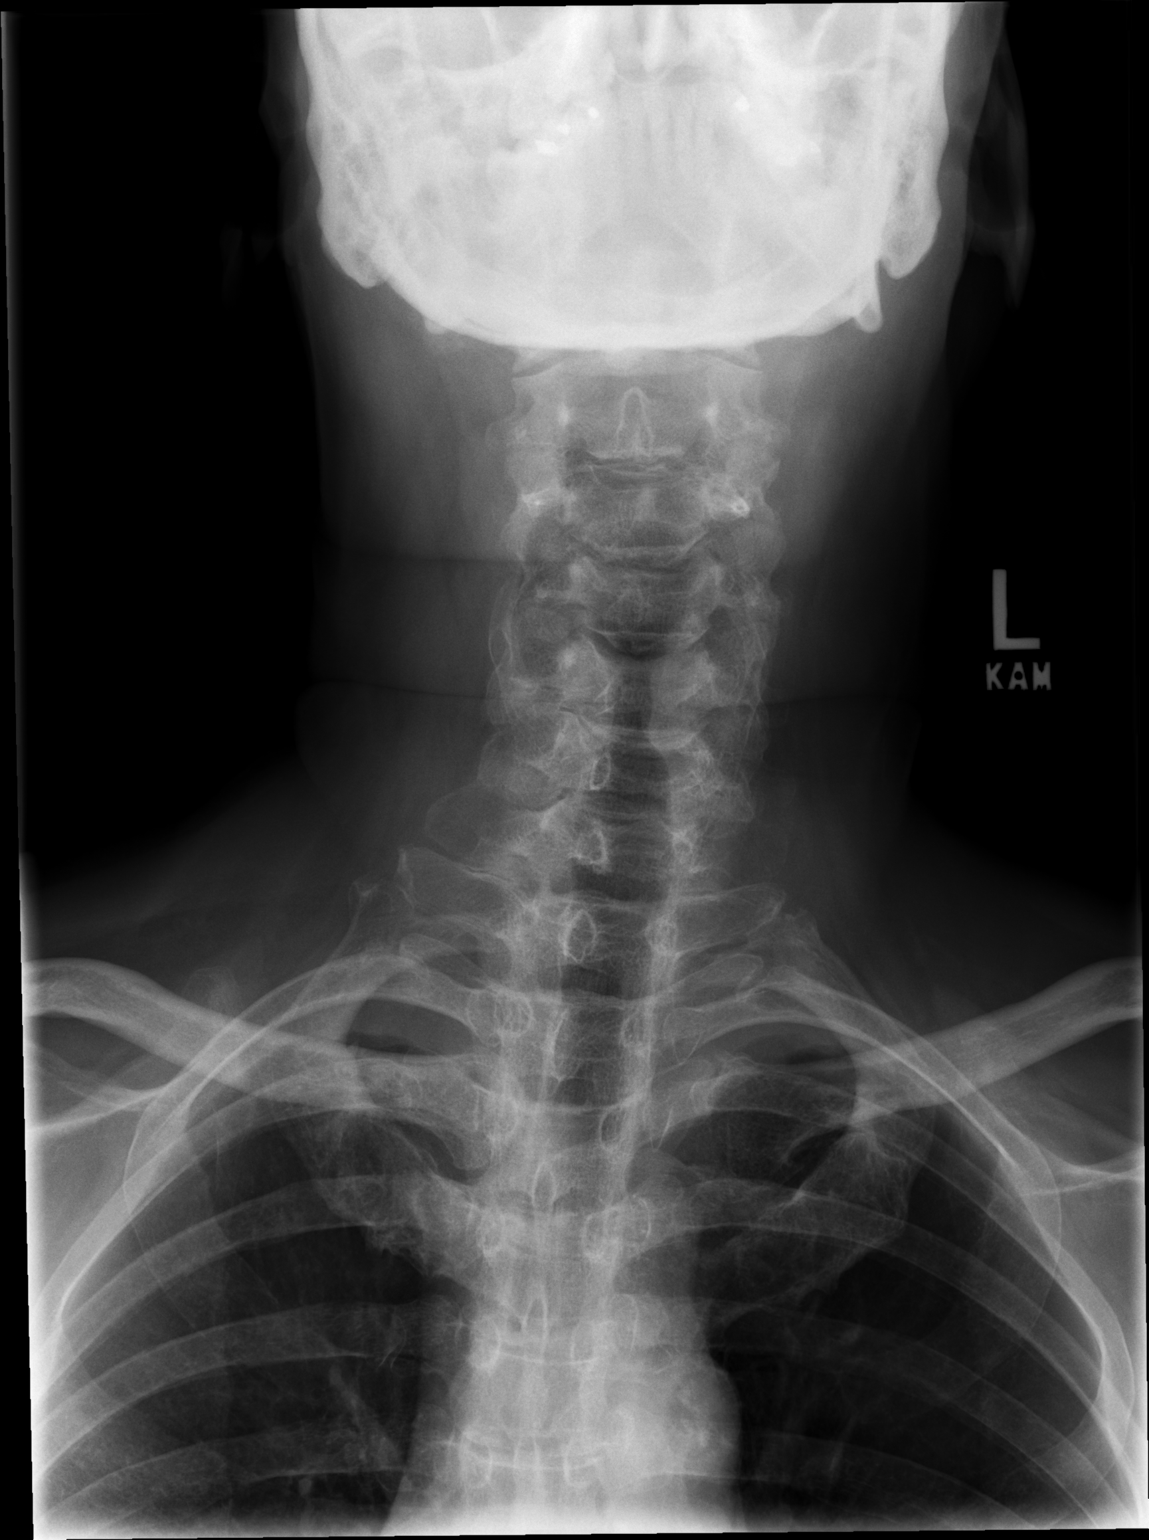

[w cervical spine odontoid]
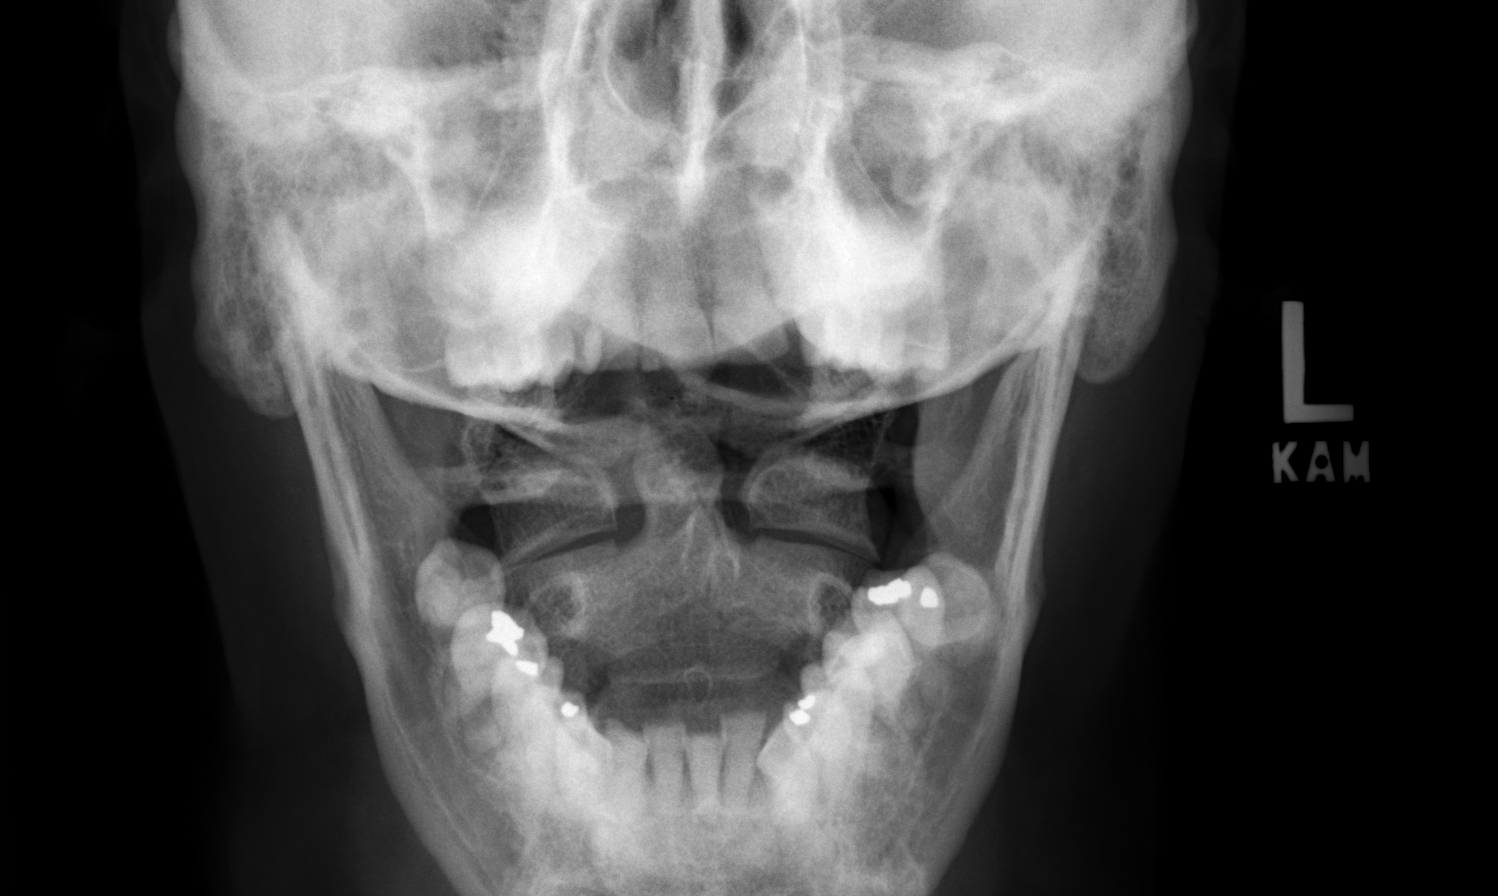

[3 of 3 positions shown; findings below may reference images not displayed]

FINDINGS: Mild cervicothoracic scoliosis. Diffuse severe multilevel disc
degeneration and endplate osteophyte formation. Disc degeneration
most prominent at C3-C4, C5-C6, and C6-C7. Mild anterolisthesis C2
on C3. This may be degenerative. No evidence of fracture or
dislocation. Stable biapical pleural thickening most consistent
scarring.
IMPRESSION: Mild cervicothoracic scoliosis. Diffuse severe multilevel
degenerative change. Disc degeneration most prominent at C3-C4,
C5-C6, and C6-C7. Mild anterolisthesis of C2 on C3. This may be
degenerative. No evidence of fracture or dislocation.

## 2024-03-14 IMAGING — CR DG THORACIC SPINE 2V
3 series · 3 of 3 positions shown · non-contrast
Comparison: CT abdomen 03/14/2018. Chest x-ray and rib series
07/06/2017.

CLINICAL DATA: Thoracic spine and neck pain.

EXAM:
THORACIC SPINE 2 VIEWS

[w thoracic spine ap]
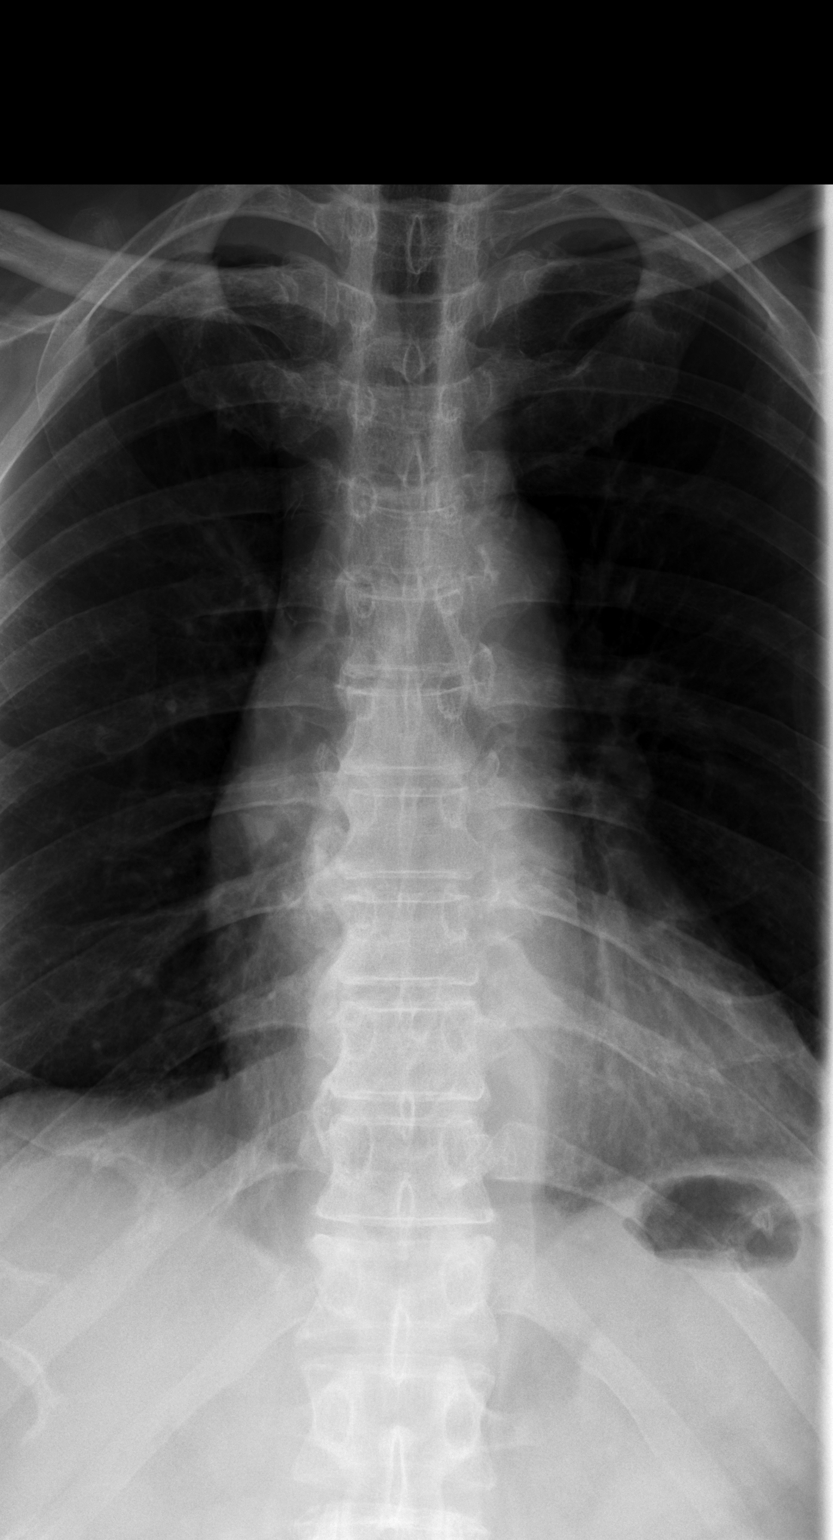

[w thoracic spine lat (1 of 2)]
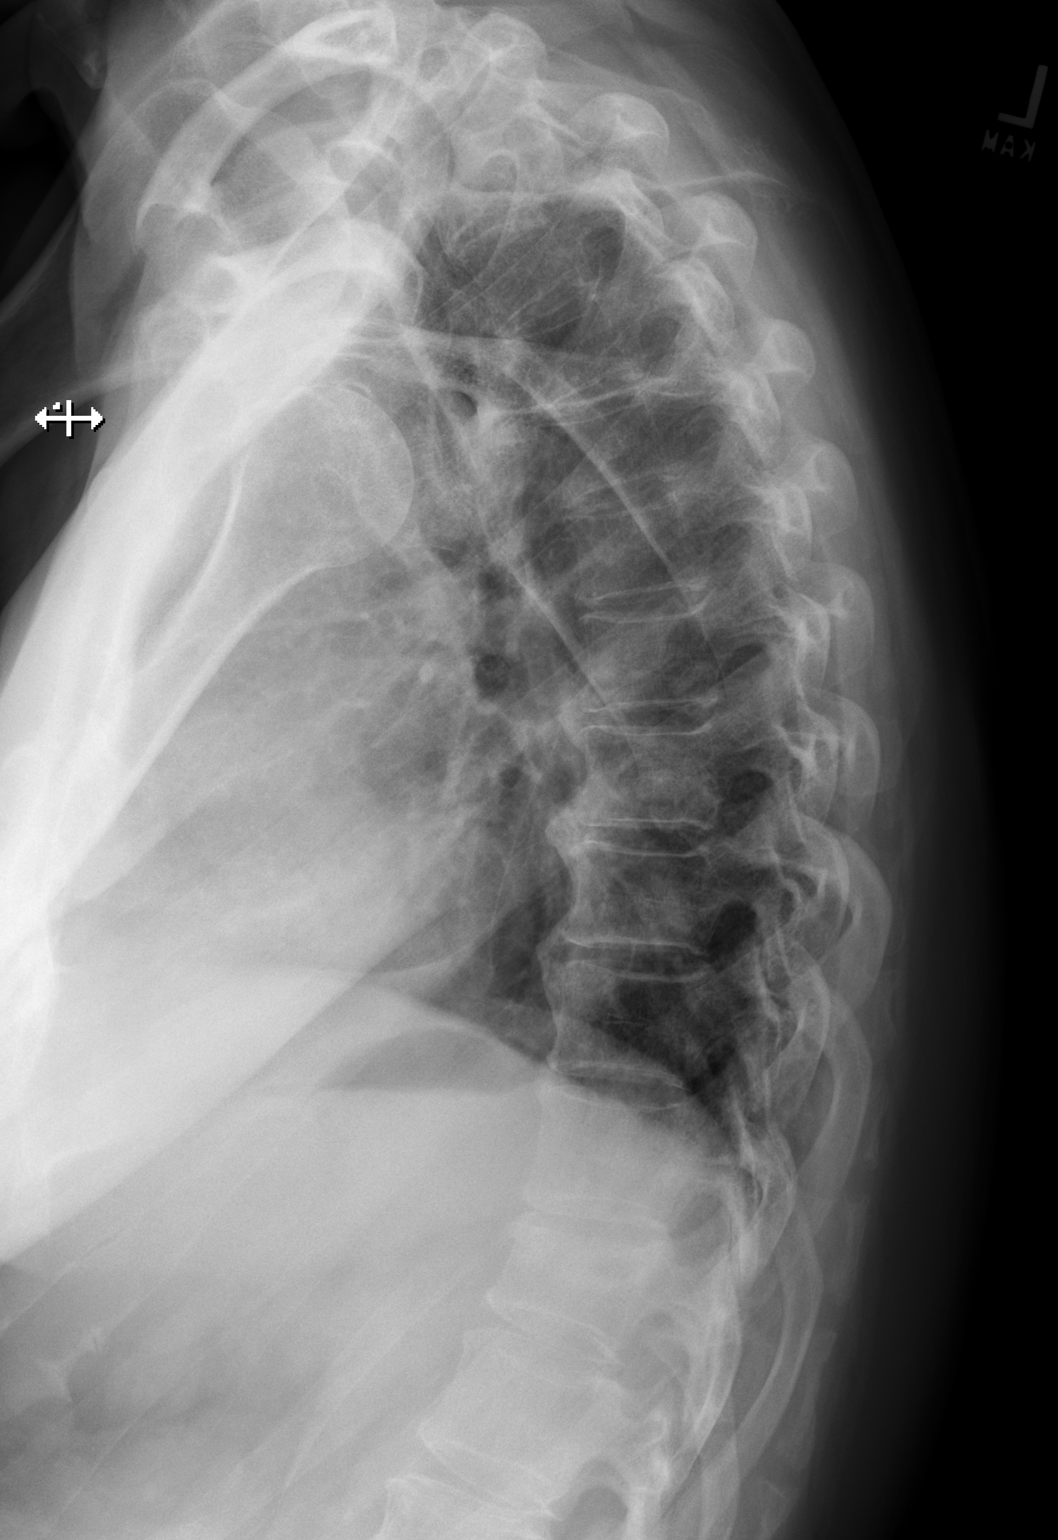

[w thoracic spine lat (2 of 2)]
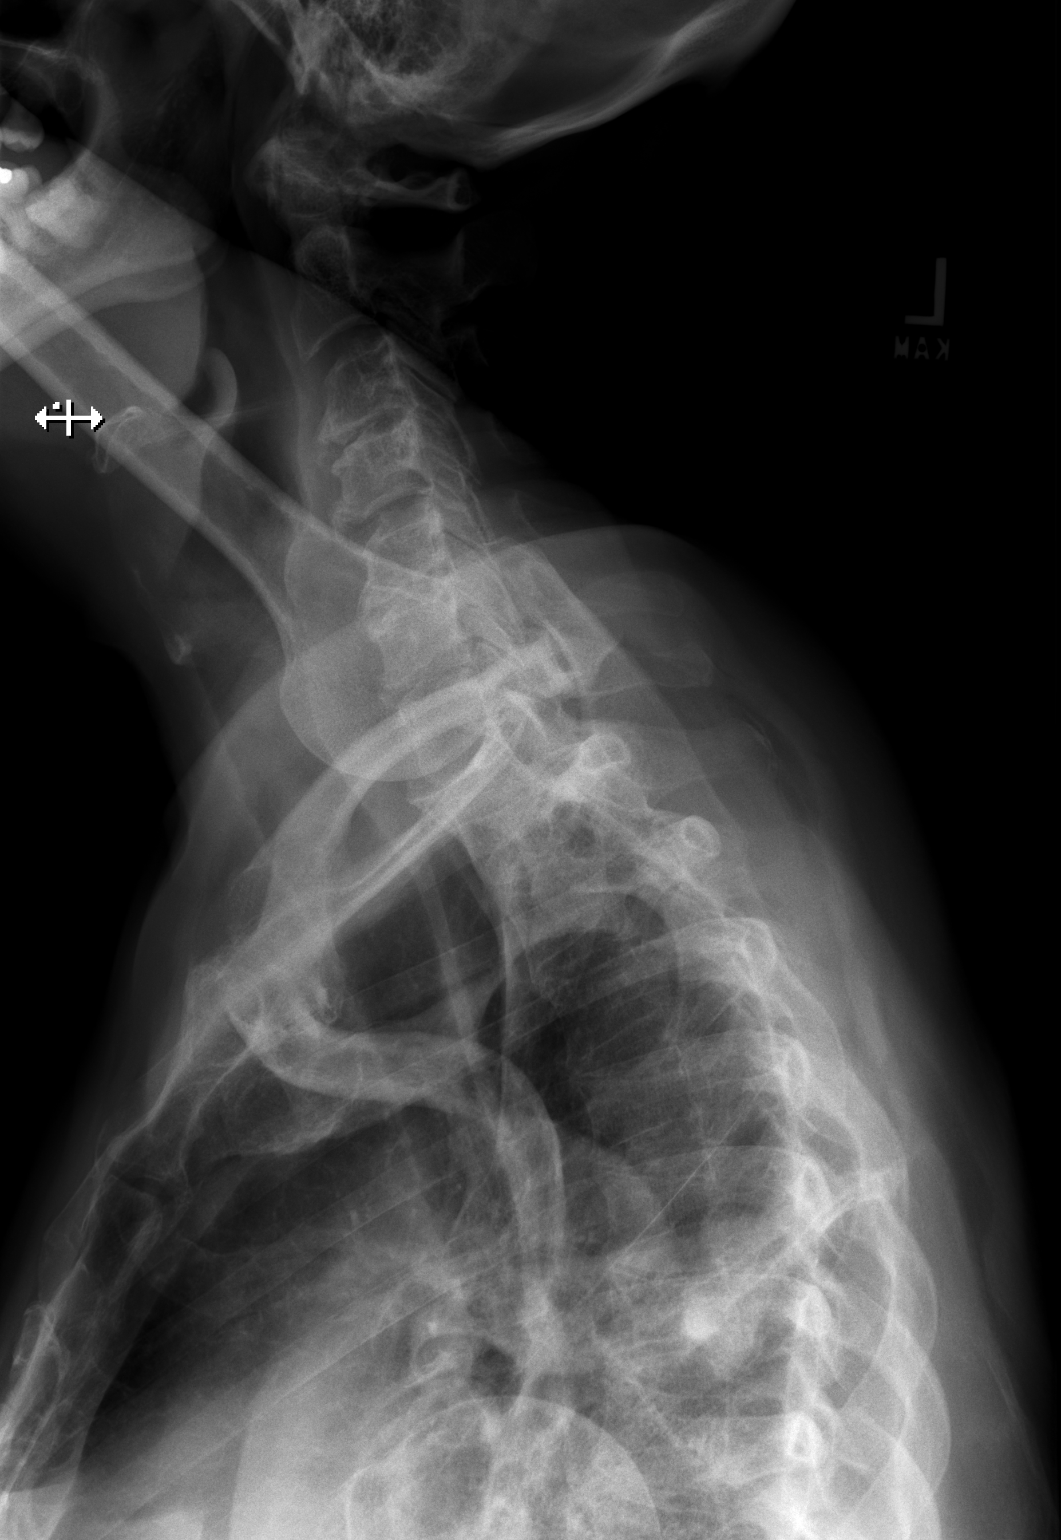

[3 of 3 positions shown; findings below may reference images not displayed]

FINDINGS: Diffuse multilevel degenerative cervical and thoracic spine. A mild
compression fracture for appears to be T12 cannot be excluded. Age
is undetermined. Adjacent mild paraspinal soft tissue swelling
cannot be excluded. Mild anterolisthesis C2 on C3 incidentally
noted. Biapical pleural thickening most consistent scarring again
noted.
IMPRESSION: 1. Mild compression fracture of what appears to be T12 cannot be
excluded. Age is undetermined. Adjacent mild paraspinal soft tissue
swelling cannot be excluded.

2. Diffuse multilevel degenerative change cervical and thoracic
spine. Mild anterolisthesis C2 on C3 incidentally noted.

## 2024-03-14 MED ORDER — FOLIC ACID 1 MG PO TABS
1.0000 mg | ORAL_TABLET | Freq: Every day | ORAL | Status: DC
  Administered 2024-03-14 – 2024-03-21 (×8): 1 mg via ORAL
  Filled 2024-03-14 (×8): qty 1

## 2024-03-14 MED ORDER — ACETAMINOPHEN 650 MG RE SUPP: 650.0000 mg | Freq: Four times a day (QID) | RECTAL | Status: DC | PRN

## 2024-03-14 MED ORDER — CALCIUM GLUCONATE-NACL 1-0.675 GM/50ML-% IV SOLN
1.0000 g | Freq: Once | INTRAVENOUS | Status: AC
  Administered 2024-03-14: 1000 mg via INTRAVENOUS
  Filled 2024-03-14: qty 50

## 2024-03-14 MED ORDER — THIAMINE MONONITRATE 100 MG PO TABS
100.0000 mg | ORAL_TABLET | Freq: Every day | ORAL | Status: DC
  Administered 2024-03-15 – 2024-03-21 (×6): 100 mg via ORAL
  Filled 2024-03-14 (×7): qty 1

## 2024-03-14 MED ORDER — PREGABALIN 25 MG PO CAPS
50.0000 mg | ORAL_CAPSULE | Freq: Two times a day (BID) | ORAL | Status: DC
  Administered 2024-03-14 – 2024-03-15 (×4): 50 mg via ORAL
  Filled 2024-03-14 (×4): qty 2

## 2024-03-14 MED ORDER — ACETAMINOPHEN 325 MG PO TABS: 650.0000 mg | ORAL_TABLET | Freq: Four times a day (QID) | ORAL | Status: DC | PRN

## 2024-03-14 MED ORDER — LORAZEPAM 1 MG PO TABS: 1.0000 mg | ORAL_TABLET | ORAL | Status: AC | PRN

## 2024-03-14 MED ORDER — THIAMINE HCL 100 MG/ML IJ SOLN
500.0000 mg | Freq: Three times a day (TID) | INTRAVENOUS | Status: AC
  Administered 2024-03-14 (×3): 500 mg via INTRAVENOUS
  Filled 2024-03-14 (×4): qty 5

## 2024-03-14 MED ORDER — LORAZEPAM 2 MG/ML IJ SOLN: 1.0000 mg | INTRAMUSCULAR | Status: DC | PRN

## 2024-03-14 MED ORDER — LACTATED RINGERS IV SOLN: INTRAVENOUS | Status: DC

## 2024-03-14 MED ORDER — ADULT MULTIVITAMIN W/MINERALS CH
1.0000 | ORAL_TABLET | Freq: Every day | ORAL | Status: DC
  Administered 2024-03-14 – 2024-03-21 (×8): 1 via ORAL
  Filled 2024-03-14 (×8): qty 1

## 2024-03-14 MED ORDER — MAGNESIUM SULFATE 4 GM/100ML IV SOLN
4.0000 g | Freq: Once | INTRAVENOUS | Status: AC
  Administered 2024-03-14: 4 g via INTRAVENOUS
  Filled 2024-03-14: qty 100

## 2024-03-14 MED ORDER — HYDROCODONE-ACETAMINOPHEN 5-325 MG PO TABS
1.0000 | ORAL_TABLET | Freq: Four times a day (QID) | ORAL | Status: DC | PRN
Start: 2024-03-14 — End: 2024-03-21
  Administered 2024-03-14 – 2024-03-21 (×7): 1 via ORAL
  Filled 2024-03-14 (×7): qty 1

## 2024-03-14 MED ORDER — ATORVASTATIN CALCIUM 80 MG PO TABS
80.0000 mg | ORAL_TABLET | Freq: Every day | ORAL | Status: DC
  Administered 2024-03-14 – 2024-03-21 (×8): 80 mg via ORAL
  Filled 2024-03-14 (×8): qty 1

## 2024-03-14 MED ORDER — ENOXAPARIN SODIUM 40 MG/0.4ML IJ SOSY
40.0000 mg | PREFILLED_SYRINGE | Freq: Every day | INTRAMUSCULAR | Status: DC
  Administered 2024-03-14 – 2024-03-21 (×8): 40 mg via SUBCUTANEOUS
  Filled 2024-03-14 (×8): qty 0.4

## 2024-03-14 MED ORDER — THIAMINE HCL 100 MG/ML IJ SOLN
100.0000 mg | Freq: Every day | INTRAMUSCULAR | Status: DC
  Filled 2024-03-14: qty 2

## 2024-03-14 NOTE — Progress Notes (Signed)
 Patient admitted after midnight, please see H&P from Dr. Franky. Magnesium replaced Folic acid low so will replace PT eval pending Needs alcohol cessation  Harlene Bowl, DO

## 2024-03-14 NOTE — ED Notes (Signed)
 Floor notified patient coming upstairs.

## 2024-03-14 NOTE — H&P (Addendum)
 History and Physical    Kenneth Joseph FMW:[id number] DOB: 15-Sep-1955 DOA: 03/13/2024  Patient coming from: Home.  Chief Complaint: Lower extremity pain.  HPI: Kenneth Joseph is a 68 y.o. male with history of diabetes mellitus type 2, hypertension, hyperlipidemia, B12 deficiency presents to the ER because of worsening pain in the lower extremities.  Patient had been having some tingling and numbness for last few months and had followed up with neurologist in June 2025 for dizziness when neurologist did diagnosed with with the likely from alcohol induced and had offered nerve studies but at that time per the notes patient had declined comes to the ER because of worsening pain in the bilateral lower extremity which has acutely worsened last 4 days.  Patient is finding it difficult to ambulate because of the pain.  Patient had recently traveled to [place]  last week and after returning over the last 4 days patient also has been having watery diarrhea.  Denies any abdominal pain nausea or vomiting.  Denies any fever or chills.  Denies using any antibiotics recently.  Drinks alcohol but last 2 to 3 days he has not been able to drink because of difficulty ambulating.  ED Course: In the ER patient CT abdomen pelvis shows features concerning for colitis.  Also showed features concerning for cholecystitis but patient did not have any abdominal pain and was not tender on exam.  Patient had transient episodes of hypotension was given fluids following which blood pressure improved.  Labs show macrocytosis without anemia.  Patient admitted for colitis with neuropathic pain.  LFTs show mildly elevated total bilirubin which is mostly indirect bilirubin.  AST and ALT lipase and alkaline phosphatase were normal.  Review of Systems: As per HPI, rest all negative.   Past Medical History:  Diagnosis Date   Anemia    Arthritis    Diabetes mellitus without complication (HCC)    type 2 per patient   Hepatitis C  2001   History of hiatal hernia    History of kidney stones    Hypertension     Past Surgical History:  Procedure Laterality Date   ANTERIOR CERVICAL DECOMP/DISCECTOMY FUSION N/A 10/21/2021   Procedure: ANTERIOR CERVICAL DECOMPRESSION FUSION CERVICAL FOUR- CERVICAL FIVE WITH INSTRUMENTATION AND ALLOGRAFT;  Surgeon: Beuford Anes, MD;  Location: [facility] OR;  Service: Orthopedics;  Laterality: N/A;   EYE SURGERY     bilateral cataract removal     reports that he has never smoked. He has never used smokeless tobacco. He reports current alcohol use. He reports that he does not use drugs.  No Known Allergies  Family History  Problem Relation Age of Onset   Hypertension Mother    Mental illness Mother    Diabetes Father    Heart disease Father    Hypertension Father    Stroke Father     Prior to Admission medications   Medication Sig Start Date End Date Taking? Authorizing Provider  colchicine 0.6 MG tablet Take 0.6 mg by mouth daily. 09/28/23  Yes [provider]  olmesartan (BENICAR) 20 MG tablet Take 20 mg by mouth daily. 02/19/24  Yes [provider]  amLODipine-olmesartan (AZOR) 10-40 MG tablet Take 0.5 tablets by mouth in the morning.    [provider]  atorvastatin (LIPITOR) 80 MG tablet Take 80 mg by mouth daily at 6 PM.    [provider]  glipiZIDE-metformin (METAGLIP) 5-500 MG tablet Take 1 tablet by mouth See admin instructions. Take 1 tab in the  morning and 0.5 tab in the evening 07/15/21   [provider]  meloxicam (MOBIC) 15 MG tablet Take 15 mg by mouth daily. 01/09/22   [provider]  Specialty Vitamins Products (NERVIVE NERVE RELIEF) TABS Take by mouth.    [provider]    Physical Exam: Constitutional: Moderately built and nourished. Vitals:   03/13/24 2330 03/13/24 2345 03/14/24 0000 03/14/24 0039  BP: 114/81 121/82 111/81   Pulse: 93 91 91   Resp: 12 14 10    Temp:    98.3 F (36.8 C)  TempSrc:     Oral  SpO2: 91% 96% 97%    Eyes: Anicteric no pallor. ENMT: No discharge from the ears eyes nose or mouth. Neck: No mass felt.  No neck rigidity. Respiratory: No rhonchi or crepitations. Cardiovascular: S1-S2 heard. Abdomen: Soft nontender bowel sound present. Musculoskeletal: No edema. Skin: No rash. Neurologic: Alert awake oriented to time place and person.  Moves all extremities. Psychiatric: Appears normal.  Normal affect.   Labs on Admission: I have personally reviewed following labs and imaging studies  CBC: Recent Labs  Lab 03/13/24 1315 03/13/24 1848  WBC 10.2  --   NEUTROABS 7.4  --   HGB 14.0 14.3  HCT 40.5 42.0  MCV 106.0*  --   PLT 104*  --    Basic Metabolic Panel: Recent Labs  Lab 03/13/24 1848 03/14/24 0039  NA 142 139  K 3.8 3.3*  CL 99 102  CO2  --  26  GLUCOSE 139* 131*  BUN 9 8  CREATININE 0.90 0.99  CALCIUM  --  6.9*   GFR: CrCl cannot be calculated (Unknown ideal weight.). Liver Function Tests: Recent Labs  Lab 03/14/24 0039  AST 28  27  ALT 16  16  ALKPHOS 78  77  BILITOT 2.0*  2.0*  PROT 6.1*  6.3*  ALBUMIN 2.1*  2.3*   Recent Labs  Lab 03/14/24 0039  LIPASE 23   No results for input(s): AMMONIA in the last 168 hours. Coagulation Profile: No results for input(s): INR, PROTIME in the last 168 hours. Cardiac Enzymes: No results for input(s): CKTOTAL, CKMB, CKMBINDEX, TROPONINI in the last 168 hours. BNP (last 3 results) No results for input(s): PROBNP in the last 8760 hours. HbA1C: Recent Labs    03/14/24 0039  HGBA1C 5.5   CBG: No results for input(s): GLUCAP in the last 168 hours. Lipid Profile: No results for input(s): CHOL, HDL, LDLCALC, TRIG, CHOLHDL, LDLDIRECT in the last 72 hours. Thyroid  Function Tests: No results for input(s): TSH, T4TOTAL, FREET4, T3FREE, THYROIDAB in the last 72 hours. Anemia Panel: No results for input(s): VITAMINB12, FOLATE, FERRITIN,  TIBC, IRON, RETICCTPCT in the last 72 hours. Urine analysis:    Component Value Date/Time   COLORURINE AMBER (A) 03/13/2024 1315   APPEARANCEUR CLEAR 03/13/2024 1315   LABSPEC 1.043 (H) 03/13/2024 1315   PHURINE 6.0 03/13/2024 1315   GLUCOSEU NEGATIVE 03/13/2024 1315   HGBUR NEGATIVE 03/13/2024 1315   BILIRUBINUR NEGATIVE 03/13/2024 1315   BILIRUBINUR negative 04/21/2015 1710   KETONESUR NEGATIVE 03/13/2024 1315   PROTEINUR NEGATIVE 03/13/2024 1315   UROBILINOGEN 0.2 04/21/2015 1710   NITRITE NEGATIVE 03/13/2024 1315   LEUKOCYTESUR NEGATIVE 03/13/2024 1315   Sepsis Labs: @LABRCNTIP (procalcitonin:4,lacticidven:4) )No results found for this or any previous visit (from the past 240 hours).   Radiological Exams on Admission: CT ABDOMEN PELVIS W CONTRAST Result Date: 03/13/2024 EXAM: CT ABDOMEN AND PELVIS WITH CONTRAST 03/13/2024 07:19:36 PM TECHNIQUE: CT  of the abdomen and pelvis was performed with the administration of 75 mL of iohexol (OMNIPAQUE) 350 MG/ML injection. Multiplanar reformatted images are provided for review. Automated exposure control, iterative reconstruction, and/or weight-based adjustment of the mA/kV was utilized to reduce the radiation dose to as low as reasonably achievable. COMPARISON: CT abdomen and pelvis 03/14/2018. CLINICAL HISTORY: Diarrhea. Hypotension; Diarrhea x several days; Hx of hep C; 75 ml omni 350 FINDINGS: LOWER CHEST: No acute abnormality. LIVER: The liver is unremarkable. GALLBLADDER AND BILE DUCTS: There is trace inflammatory stranding surrounding the gallbladder. The gallbladder is distended. No biliary ductal dilatation. SPLEEN: No acute abnormality. PANCREAS: No acute abnormality. ADRENAL GLANDS: No acute abnormality. KIDNEYS, URETERS AND BLADDER: There are punctate nonobstructing bilateral renal calculi, left greater than right. No hydronephrosis. No perinephric or periureteral stranding. Urinary bladder is unremarkable. GI AND BOWEL: Stomach  demonstrates no acute abnormality. There is some mild diffuse colonic wall thickening. There is descending and sigmoid colon diverticulosis. The appendix is within normal limits. There is no bowel obstruction. PERITONEUM AND RETROPERITONEUM: No ascites. No free air. VASCULATURE: Aorta is normal in caliber. There are atherosclerotic calcifications of the aorta. LYMPH NODES: No lymphadenopathy. REPRODUCTIVE ORGANS: Prostate gland is mildly enlarged. BONES AND SOFT TISSUES: There is chronic compression deformity of the superior endplate of T11 and T12 which appears new from 2019. There are small fat-containing bilateral inguinal hernias. No focal soft tissue abnormality. IMPRESSION: 1. Mild diffuse colonic wall thickening worrisome for nonspecific colitis . 2. Findings suspicious for acute cholecystitis. 3. Descending and sigmoid colon diverticulosis without evidence of diverticulitis. 4. Punctate nonobstructing bilateral renal calculi, left greater than right. 5. Chronic compression deformities of the superior endplates of T11 and T12, new from 2019. correlate clinically. Electronically signed by: Greig Pique MD 03/13/2024 07:37 PM EDT RP Workstation: [id number]   DG Chest Portable 1 View Result Date: 03/13/2024 EXAM: 1 VIEW(S) XRAY OF THE CHEST 03/13/2024 04:50:00 PM COMPARISON: 12 slash 21 slash 23 CLINICAL HISTORY: near syncope FINDINGS: LUNGS AND PLEURA: No focal pulmonary opacity. No pulmonary edema. No pleural effusion. No pneumothorax. HEART AND MEDIASTINUM: No acute abnormality of the cardiac and mediastinal silhouettes. BONES AND SOFT TISSUES: Partially visible lower cervical spine ACDF hardware. No acute osseous abnormality. IMPRESSION: 1. No acute cardiopulmonary process . Electronically signed by: Norman Gatlin MD 03/13/2024 05:15 PM EDT RP Workstation: [id number]    EKG: Independently reviewed.  Sinus rhythm low voltage.  Assessment/Plan Principal Problem:   Colitis Active Problems:    Gout   Hypertension   Peripheral polyneuropathy   Type 2 diabetes mellitus with other specified complication (HCC)   Vitamin B12 deficiency   Alcohol abuse    Colitis -     cause not clear was transiently hypotensive could be related to dehydration of the morphine which patient received.  Will keep on IV fluids check stool studies.  Continue to monitor. Bilateral lower extremity pain likely from neuropathy per neurology notes in June 2025 patient likely had neuropathy from alcohol abuse.  At that time patient had declined any nerve studies.  Discussed with on-call neurologist Dr. Voncile who felt patient's neuropathy could be from alcohol.  Will need outpatient nerve studies okay to start Lyrica.  Patient's thiamine level, B12 TSH RPR SPEP folate levels are pending.  Hemoglobin A1c is 5.5.  Patient has good bounding pulse on both extremities. Hypertension takes ARB.  Since blood pressure was in the low normal holding ARB for now.  Low blood pressure could have been from dehydration  or the use of morphine in the ER. Diabetes mellitus type 2 takes metformin and glipizide presently on sliding scale coverage.  Hemoglobin A1c today is 5.5. B12 deficiency takes B12 shots every month. Alcohol abuse has not had any alcohol last 3 days because patient was finding it difficult to ambulate because of the pain.  On CIWA protocol.  Thiamine. Hyperlipidemia takes statins. Possible cholecystitis seen on the CAT scan.  Abdomen is benign on exam.  Will check ultrasound of the gallbladder. Elevated bilirubin mostly indirect bilirubin.  Follow-up. History of gout takes meloxicam.  No signs of any gouty exacerbation. Macrocytosis could be from alcohol abuse.  Follow B12 and folate levels.  Since patient has colitis with worsening peripheral neuropathy pain will need close monitoring further workup and more than 2 midnight stay.   DVT prophylaxis: Lovenox. Code Status: Full code. Family Communication: [place] with  patient. Disposition Plan: Medical floor. Consults called: Physical therapy.  Discussed with neurologist. Admission status: Observation.

## 2024-03-14 NOTE — Progress Notes (Signed)
   03/14/24 0940  TOC Brief Assessment  Insurance and Status Reviewed  Patient has primary care physician Yes  Home environment has been reviewed From home  Prior level of function: Independent  Prior/Current Home Services No current home services  Social Drivers of Health Review SDOH reviewed interventions complete  Readmission risk has been reviewed Yes  Transition of care needs no transition of care needs at this time   Pt from home, 2 story single family residence c/bedrooms and baths upstairs. He lives c/his nephew who works two jobs and is rarely at home. Pt does have a medic-alert system since he is usually home alone. Current DME: cane, rollator, raised toilet seat c/rails, walk-in shower c/seat. Pt referred to [facility] for substance abuse resources. Pt declines, stating that he is not addicted to alcohol, and has had no problem doing without it since he has been ill. Discussed the effects of alcohol r/t diabetes and overall health. Pt states he is aware and had been limiting alcohol prior to present illness.   Pt is active c/outpatient complex care management team for SDOH resources. Pt's sister is to provide transportation at discharge.

## 2024-03-14 NOTE — ED Notes (Signed)
 Messaged provider for pain medication.  Case management at bedside. Messaged Dr. For a diet order.

## 2024-03-14 NOTE — Plan of Care (Signed)

## 2024-03-14 NOTE — Progress Notes (Signed)
 Pending stool sample to be collected since day shift. Patient has not had a BM at the moment. Patient was instructed to call the nurse whenever he have a BM so a stool sample can be collected. Will continue to monitor.

## 2024-03-14 NOTE — Progress Notes (Signed)
 Physical Therapy Evaluation Patient Details Name: Kenneth Joseph MRN: [id number] DOB: [birth year] Today's Date: 03/14/2024  History of Present Illness  Pt is a 68 y/o male presenting 10/15 to the ER with worsening pain in the LE's.  Subsequently pt has been having watery stools.  Work up for colitis and intervention for acute neuropathic pain.  ETOH use, DM, Hep C, HTN,  Clinical Impression  Pt admitted with/for the above problems.  Pt still has not realized significant relief and needing min to mod assist for basic mobility ans STS.  Pt currently limited functionally due to the problems listed. ( See problems list.)   Pt will benefit from PT to maximize function and safety in order to get ready for next venue listed below.         If plan is discharge home, recommend the following: A lot of help with bathing/dressing/bathroom;A lot of help with walking and/or transfers;Assistance with cooking/housework;Assist for transportation;Help with stairs or ramp for entrance   Can travel by private vehicle   No    Equipment Recommendations BSC/3in1;Wheelchair (measurements PT);Wheelchair cushion (measurements PT)  Recommendations for Other Services       Functional Status Assessment Patient has had a recent decline in their functional status and demonstrates the ability to make significant improvements in function in a reasonable and predictable amount of time.     Precautions / Restrictions Precautions Precautions: Fall Recall of Precautions/Restrictions: Intact      Mobility  Bed Mobility Overal bed mobility: Modified Independent             General bed mobility comments: notable struggle to come forward and up, but without assist.    Transfers Overall transfer level: Needs assistance   Transfers: Sit to/from Stand, Bed to chair/wheelchair/BSC Sit to Stand: Mod assist   Step pivot transfers: Mod assist       General transfer comment: significant assist for boost due to  elevated neuropathic pain bil    Ambulation/Gait               General Gait Details: unable  Stairs            Wheelchair Mobility     Tilt Bed    Modified Rankin (Stroke Patients Only)       Balance Overall balance assessment: Needs assistance Sitting-balance support: Bilateral upper extremity supported, No upper extremity supported, Feet supported Sitting balance-Leahy Scale: Fair (to poor.) Sitting balance - Comments: pt can only maintain sitting EOB without UE's for a short period and then slowly falls posteriorly.  To stay up, pt must use both UE's to prop. Postural control: Posterior lean Standing balance support: Bilateral upper extremity supported, Reliant on assistive device for balance, During functional activity Standing balance-Leahy Scale: Poor Standing balance comment: reliant on RW /external support due to pain/neuropathy                             Pertinent Vitals/Pain Pain Assessment Pain Assessment: 0-10 Pain Score: 8  Pain Location: bil feet Pain Descriptors / Indicators: Sharp Pain Intervention(s): Limited activity within patient's tolerance, Other (comment) (Lyrica begun today)    Home Living Family/patient expects to be discharged to:: Private residence Living Arrangements: Other relatives;Other (Comment) (nephew) Available Help at Discharge: Available PRN/intermittently;Family (nephew works 16+ hours per day.) Type of Home: House Home Access: Stairs to enter     Alternate Level Stairs-Number of Steps: flight Home Layout: Two level;Bed/bath upstairs Home Equipment:  Rolling Walker (2 wheels)      Prior Function Prior Level of Function : Independent/Modified Independent;Other (comment) (pt relates a decline in safe independence in the the last 1-2 months or more.)               ADLs Comments: (lately) more fearful of getting in the shower, not able to cook, extreme difficulty getting up/down the steps.      Extremity/Trunk Assessment   Upper Extremity Assessment Upper Extremity Assessment: Generalized weakness;Overall [facility] for tasks assessed (neuropathy in both handswith decresed dexterity per pt)    Lower Extremity Assessment Lower Extremity Assessment: Generalized weakness;Overall WFL for tasks assessed (neropathic pain numbness)    Cervical / Trunk Assessment Cervical / Trunk Assessment: Normal  Communication   Communication Communication: No apparent difficulties    Cognition Arousal: Alert Behavior During Therapy: WFL for tasks assessed/performed   PT - Cognitive impairments: No apparent impairments                         Following commands: Intact       Cueing Cueing Techniques: Verbal cues     General Comments General comments (skin integrity, edema, etc.): pt reports his dtr and ex wife are coming for ~ 4 days to assist with disposition options, itc.    Exercises     Assessment/Plan    PT Assessment Patient needs continued PT services  PT Problem List Decreased strength;Decreased activity tolerance;Decreased balance;Decreased mobility;Decreased coordination;Impaired sensation;Pain       PT Treatment Interventions DME instruction;Gait training;Stair training;Functional mobility training;Therapeutic activities;Balance training;Patient/family education    PT Goals (Current goals can be found in the Care Plan section)  Acute Rehab PT Goals Patient Stated Goal: lessen pain.  Figure out my options. PT Goal Formulation: With patient Time For Goal Achievement: 03/28/24 Potential to Achieve Goals: Fair    Frequency Min 3X/week     Co-evaluation               AM-PAC PT 6 Clicks Mobility  Outcome Measure Help needed turning from your back to your side while in a flat bed without using bedrails?: None Help needed moving from lying on your back to sitting on the side of a flat bed without using bedrails?: A Little Help needed moving to and  from a bed to a chair (including a wheelchair)?: A Lot Help needed standing up from a chair using your arms (e.g., wheelchair or bedside chair)?: A Little Help needed to walk in hospital room?: A Lot Help needed climbing 3-5 steps with a railing? : Total 6 Click Score: 15    End of Session   Activity Tolerance: Patient limited by pain Patient left: in bed;with call bell/phone within reach Nurse Communication: Mobility status PT Visit Diagnosis: Unsteadiness on feet (R26.81);Other abnormalities of gait and mobility (R26.89);Muscle weakness (generalized) (M62.81);Difficulty in walking, not elsewhere classified (R26.2);Pain Pain - Right/Left:  (both) Pain - part of body: Ankle and joints of foot    Time: 1848-1931 PT Time Calculation (min) (ACUTE ONLY): 43 min   Charges:   PT Evaluation $PT Eval Moderate Complexity: 1 Mod PT Treatments $Therapeutic Activity: 23-37 mins PT General Charges $$ ACUTE PT VISIT: 1 Visit         03/14/2024  [place] HERO., PT Acute Rehabilitation Services [phone]  (office)  Vinie GAILS Sherisa Gilvin 03/14/2024, 7:55 PM

## 2024-03-14 NOTE — ED Notes (Signed)
 PT attempted to stand at bedside with this rn.  PT says the pain in his feet is preventing him from being able to stand.

## 2024-03-14 NOTE — Progress Notes (Signed)
 Patient is refusing mobility. He states that whenever he stands or move, his pain increases. However, he is able to turn in his bed. Patient educated, expressed understanding. Will continue to monitor.

## 2024-03-15 DIAGNOSIS — K529 Noninfective gastroenteritis and colitis, unspecified: Secondary | ICD-10-CM | POA: Diagnosis not present

## 2024-03-15 LAB — BASIC METABOLIC PANEL WITH GFR
Anion gap: 11 (ref 5–15)
BUN: 10 mg/dL (ref 8–23)
CO2: 23 mmol/L (ref 22–32)
Calcium: 7 mg/dL — ABNORMAL LOW (ref 8.9–10.3)
Chloride: 102 mmol/L (ref 98–111)
Creatinine, Ser: 0.88 mg/dL (ref 0.61–1.24)
GFR, Estimated: >60 mL/min (ref >60)
Glucose, Bld: 145 mg/dL — ABNORMAL HIGH (ref 70–99)
Potassium: 4 mmol/L (ref 3.5–5.1)
Sodium: 136 mmol/L (ref 135–145)

## 2024-03-15 LAB — CBC
HCT: 35.2 % — ABNORMAL LOW (ref 39.0–52.0)
Hemoglobin: 11.8 g/dL — ABNORMAL LOW (ref 13.0–17.0)
MCH: 36.1 pg — ABNORMAL HIGH (ref 26.0–34.0)
MCHC: 33.5 g/dL (ref 30.0–36.0)
MCV: 107.6 fL — ABNORMAL HIGH (ref 80.0–100.0)
Platelets: 126 K/uL — ABNORMAL LOW (ref 150–400)
RBC: 3.27 MIL/uL — ABNORMAL LOW (ref 4.22–5.81)
RDW: 15.1 % (ref 11.5–15.5)
WBC: 10 K/uL (ref 4.0–10.5)
nRBC: 0 % (ref 0.0–0.2)

## 2024-03-15 LAB — VITAMIN B12: Vitamin B-12: 982 pg/mL — ABNORMAL HIGH (ref 180–914)

## 2024-03-15 LAB — MAGNESIUM: Magnesium: 2 mg/dL (ref 1.7–2.4)

## 2024-03-15 MED ADMIN — Pregabalin Cap 100 MG: 100 mg | ORAL | NDC 50228035390

## 2024-03-15 MED FILL — Pregabalin Cap 100 MG: 100.0000 mg | ORAL | Qty: 1 | Status: AC

## 2024-03-15 NOTE — Plan of Care (Signed)
  Problem: Clinical Measurements: Goal: Ability to maintain clinical measurements within normal limits will improve Outcome: Progressing Goal: Will remain free from infection Outcome: Progressing   Problem: Nutrition: Goal: Adequate nutrition will be maintained Outcome: Progressing   Problem: Pain Managment: Goal: General experience of comfort will improve and/or be controlled Outcome: Progressing   Problem: Safety: Goal: Ability to remain free from injury will improve Outcome: Progressing   Problem: Skin Integrity: Goal: Risk for impaired skin integrity will decrease Outcome: Progressing

## 2024-03-15 NOTE — Progress Notes (Signed)
 As of now, patient report not having a BM. Therefore, stool sample still pending to be collected. Will pass this information to day shift RN for she/he to follow. Patient was instructed to call the nurse whenever he have a BM.

## 2024-03-15 NOTE — Progress Notes (Signed)
 PROGRESS NOTE    Kenneth Joseph  FMW:[id number] DOB: [birth year] DOA: 03/13/2024 PCP: Claudene Pellet, MD    Brief Narrative:  Kenneth Joseph is a 68 y.o. male with history of diabetes mellitus type 2, hypertension, hyperlipidemia, B12 deficiency presents to the ER because of worsening pain in the lower extremities.  Patient had been having some tingling and numbness for last few months and had followed up with neurologist in June 2025 for dizziness when neurologist did diagnosed with with the likely from alcohol induced and had offered nerve studies but at that time per the notes patient had declined comes to the ER because of worsening pain in the bilateral lower extremity which has acutely worsened last 4 days.  Patient is finding it difficult to ambulate because of the pain.  Patient had recently traveled to [place]  last week and after returning over the last 4 days patient also has been having watery diarrhea.    Assessment and Plan: Incidental finding of colitis -     cause not clear was transiently hypotensive could be related to dehydration of the morphine which patient received. 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  At that time patient had declined any nerve studies.  Dr. Franky discussed with on-call neurologist Dr. Voncile who felt patient's neuropathy could be from alcohol.  Will need outpatient nerve studies okay to start Lyrica-adjust dose -Replete folate  Hypertension - Resume home meds  Diabetes mellitus type 2 takes metformin and glipizide presently on sliding scale coverage.  Hemoglobin A1c today is 5.5.  B12 deficiency  -takes B12 shots every month.  Alcohol abuse has not had any alcohol last 3 days because patient was finding it difficult to ambulate because of the pain. - CIWA  Hyperlipidemia takes statins.  doubt  cholecystitis  Elevated bilirubin  - Trend  History of gout  - No signs of any gouty exacerbation.  Macrocytosis  - Replete folic acid   DVT prophylaxis: enoxaparin (LOVENOX) injection 40 mg Start: 03/14/24 1000    Code Status: Full Code   Disposition Plan:  Level of care: Telemetry Medical Status is: Observation     Consultants:  Neurology (phone)  Subjective: Overall pain better  Objective: Vitals:   03/14/24 1816 03/14/24 2056 03/15/24 0048 03/15/24 0740  BP:  106/61 (!) 111/94 107/66  Pulse: (P) 90 84 89 (!) 108  Resp:    20  Temp:  (!) 97.3 F (36.3 C) 97.9 F (36.6 C) 98.6 F (37 C)  TempSrc:    Oral  SpO2: (P) 93% 98% 99% 98%    Intake/Output Summary (Last 24 hours) at 03/15/2024 1147 Last data filed at 03/15/2024 0700 Gross per 24 hour  Intake 2536.13 ml  Output --  Net 2536.13 ml   There were no vitals filed for this visit.  Examination:   General: Appearance:     Overweight male in no acute distress     Lungs:     respirations unlabored  Heart:    Tachycardic. Normal rhythm. No murmurs, rubs, or gallops.    MS:   All extremities are intact.    Neurologic:   Awake, alert       Data Reviewed: I have personally reviewed following labs and imaging studies  CBC: Recent Labs  Lab 03/13/24 1315 03/13/24 1848 03/14/24 0330 03/15/24 0204  WBC 10.2  --  9.1  10.0  NEUTROABS 7.4  --   --   --   HGB 14.0 14.3 13.1 11.8*  HCT 40.5 42.0 39.3 35.2*  MCV 106.0*  --  109.2* 107.6*  PLT 104*  --  114* 126*   Basic Metabolic Panel: Recent Labs  Lab 03/13/24 1848 03/14/24 0039 03/14/24 0330 03/15/24 0204  NA 142 139 139 136  K 3.8 3.3* 3.6 4.0  CL 99 102 104 102  CO2  --  26 24 23   GLUCOSE 139* 131* 133* 145*  BUN 9 8 8 10   CREATININE 0.90 0.99 0.98 0.88  CALCIUM  --  6.9* 6.8* 7.0*  MG  --   --  1.1* 2.0   GFR: CrCl cannot be calculated (Unknown ideal weight.). Liver Function Tests: Recent Labs  Lab 03/14/24 0039  03/14/24 0330  AST 28  27 28   ALT 16  16 15   ALKPHOS 78  77 70  BILITOT 2.0*  2.0* 2.0*  PROT 6.1*  6.3* 5.9*  ALBUMIN 2.1*  2.3* 2.0*   Recent Labs  Lab 03/14/24 0039  LIPASE 23   No results for input(s): AMMONIA in the last 168 hours. Coagulation Profile: No results for input(s): INR, PROTIME in the last 168 hours. Cardiac Enzymes: No results for input(s): CKTOTAL, CKMB, CKMBINDEX, TROPONINI in the last 168 hours. BNP (last 3 results) No results for input(s): PROBNP in the last 8760 hours. HbA1C: Recent Labs    03/14/24 0039  HGBA1C 5.5   CBG: No results for input(s): GLUCAP in the last 168 hours. Lipid Profile: No results for input(s): CHOL, HDL, LDLCALC, TRIG, CHOLHDL, LDLDIRECT in the last 72 hours. Thyroid  Function Tests: Recent Labs    03/14/24 0039  TSH 1.248   Anemia Panel: Recent Labs    03/14/24 0330 03/15/24 0204  VITAMINB12  --  982*  FOLATE 3.1*  --    Sepsis Labs: No results for input(s): PROCALCITON, LATICACIDVEN in the last 168 hours.  No results found for this or any previous visit (from the past 240 hours).       Radiology Studies: US  Abdomen Limited RUQ (LIVER/GB) Result Date: 03/14/2024 EXAM: Right Upper Quadrant Abdominal Ultrasound 03/14/2024 04:56:00 AM TECHNIQUE: Real-time ultrasonography of the right upper quadrant of the abdomen was performed. COMPARISON: CT of the abdomen and pelvis dated 03/13/2024. CLINICAL HISTORY: 6194 Acute cholecystitis 6194. Acute cholecystitis 6194. FINDINGS: LIVER: The liver is diffusely echogenic suggesting underlying steatosis. No intrahepatic biliary ductal dilatation. No evidence of mass. There is normal hepatopetal blood flow within the portal vein. BILIARY SYSTEM: No pericholecystic fluid. The gallbladder wall measures 4 mm in thickness. No cholelithiasis. The patient is not focally tender over the gallbladder. Common bile duct is within normal limits measuring  5 mm. RIGHT KIDNEY: The right kidney is grossly unremarkable in appearances without evidence of hydronephrosis, echogenic calculi or worrisome mass lesions. OTHER: No right upper quadrant ascites. IMPRESSION: 1. No sonographic evidence of acute cholecystitis. 2. Hepatic steatosis. Electronically signed by: Evalene Coho MD 03/14/2024 05:05 AM EDT RP Workstation: [id number]   CT ABDOMEN PELVIS W CONTRAST Result Date: 03/13/2024 EXAM: CT ABDOMEN AND PELVIS WITH CONTRAST 03/13/2024 07:19:36 PM TECHNIQUE: CT of the abdomen and pelvis was performed with the administration of 75 mL of iohexol (OMNIPAQUE) 350 MG/ML injection. Multiplanar reformatted images are provided for review. Automated exposure control, iterative reconstruction, and/or weight-based adjustment of the mA/kV was utilized to reduce the radiation dose to as low as reasonably achievable. COMPARISON: CT abdomen and pelvis 03/14/2018.  CLINICAL HISTORY: Diarrhea. Hypotension; Diarrhea x several days; Hx of hep C; 75 ml omni 350 FINDINGS: LOWER CHEST: No acute abnormality. LIVER: The liver is unremarkable. GALLBLADDER AND BILE DUCTS: There is trace inflammatory stranding surrounding the gallbladder. The gallbladder is distended. No biliary ductal dilatation. SPLEEN: No acute abnormality. PANCREAS: No acute abnormality. ADRENAL GLANDS: No acute abnormality. KIDNEYS, URETERS AND BLADDER: There are punctate nonobstructing bilateral renal calculi, left greater than right. No hydronephrosis. No perinephric or periureteral stranding. Urinary bladder is unremarkable. GI AND BOWEL: Stomach demonstrates no acute abnormality. There is some mild diffuse colonic wall thickening. There is descending and sigmoid colon diverticulosis. The appendix is within normal limits. There is no bowel obstruction. PERITONEUM AND RETROPERITONEUM: No ascites. No free air. VASCULATURE: Aorta is normal in caliber. There are atherosclerotic calcifications of the aorta. LYMPH NODES: No  lymphadenopathy. REPRODUCTIVE ORGANS: Prostate gland is mildly enlarged. BONES AND SOFT TISSUES: There is chronic compression deformity of the superior endplate of T11 and T12 which appears new from 2019. There are small fat-containing bilateral inguinal hernias. No focal soft tissue abnormality. IMPRESSION: 1. Mild diffuse colonic wall thickening worrisome for nonspecific colitis . 2. Findings suspicious for acute cholecystitis. 3. Descending and sigmoid colon diverticulosis without evidence of diverticulitis. 4. Punctate nonobstructing bilateral renal calculi, left greater than right. 5. Chronic compression deformities of the superior endplates of T11 and T12, new from 2019. correlate clinically. Electronically signed by: Greig Pique MD 03/13/2024 07:37 PM EDT RP Workstation: [id number]   DG Chest Portable 1 View Result Date: 03/13/2024 EXAM: 1 VIEW(S) XRAY OF THE CHEST 03/13/2024 04:50:00 PM COMPARISON: 12 slash 21 slash 23 CLINICAL HISTORY: near syncope FINDINGS: LUNGS AND PLEURA: No focal pulmonary opacity. No pulmonary edema. No pleural effusion. No pneumothorax. HEART AND MEDIASTINUM: No acute abnormality of the cardiac and mediastinal silhouettes. BONES AND SOFT TISSUES: Partially visible lower cervical spine ACDF hardware. No acute osseous abnormality. IMPRESSION: 1. No acute cardiopulmonary process . Electronically signed by: Norman Gatlin MD 03/13/2024 05:15 PM EDT RP Workstation: [id number]        Scheduled Meds:  atorvastatin  80 mg Oral Daily   enoxaparin (LOVENOX) injection  40 mg Subcutaneous Daily   folic acid  1 mg Oral Daily   multivitamin with minerals  1 tablet Oral Daily   pregabalin  100 mg Oral BID   thiamine  100 mg Oral Daily   Or   thiamine  100 mg Intravenous Daily   Continuous Infusions:   LOS: 0 days    Time spent: 45 minutes spent on chart review, discussion with nursing staff, consultants, updating family and interview/physical exam; more than 50% of that  time was spent in counseling and/or coordination of care.    Harlene RAYMOND Bowl, DO [facility] Available via Epic secure chat 7am-7pm After these hours, please refer to coverage provider listed on [email or web address] 03/15/2024, 11:47 AM

## 2024-03-15 NOTE — Care Management Obs Status (Signed)
 MEDICARE OBSERVATION STATUS NOTIFICATION   Patient Details  Name: Kenneth Joseph MRN: [id number] Date of Birth: 05/06/1956   Medicare Observation Status Notification Given:  Yes  Spoke with  patient by phone and copy provided  Claretta Deed 03/15/2024, 2:10 PM

## 2024-03-16 DIAGNOSIS — K529 Noninfective gastroenteritis and colitis, unspecified: Secondary | ICD-10-CM | POA: Diagnosis not present

## 2024-03-16 LAB — PROTEIN ELECTROPHORESIS, SERUM
A/G Ratio: 0.6 — ABNORMAL LOW (ref 0.7–1.7)
Albumin ELP: 2.2 g/dL — ABNORMAL LOW (ref 2.9–4.4)
Alpha-1-Globulin: 0.3 g/dL (ref 0.0–0.4)
Alpha-2-Globulin: 0.6 g/dL (ref 0.4–1.0)
Beta Globulin: 1.5 g/dL — ABNORMAL HIGH (ref 0.7–1.3)
Gamma Globulin: 1.5 g/dL (ref 0.4–1.8)
Globulin, Total: 3.8 g/dL (ref 2.2–3.9)
Total Protein ELP: 6 g/dL (ref 6.0–8.5)

## 2024-03-16 LAB — COMPREHENSIVE METABOLIC PANEL WITH GFR
ALT: 15 U/L (ref 0–44)
AST: 42 U/L — ABNORMAL HIGH (ref 15–41)
Albumin: 1.7 g/dL — ABNORMAL LOW (ref 3.5–5.0)
Alkaline Phosphatase: 73 U/L (ref 38–126)
Anion gap: 12 (ref 5–15)
BUN: 9 mg/dL (ref 8–23)
CO2: 25 mmol/L (ref 22–32)
Calcium: 7.2 mg/dL — ABNORMAL LOW (ref 8.9–10.3)
Chloride: 100 mmol/L (ref 98–111)
Creatinine, Ser: 0.96 mg/dL (ref 0.61–1.24)
GFR, Estimated: >60 mL/min (ref >60)
Glucose, Bld: 163 mg/dL — ABNORMAL HIGH (ref 70–99)
Potassium: 3.6 mmol/L (ref 3.5–5.1)
Sodium: 137 mmol/L (ref 135–145)
Total Bilirubin: 0.5 mg/dL (ref 0.0–1.2)
Total Protein: 5.5 g/dL — ABNORMAL LOW (ref 6.5–8.1)

## 2024-03-16 LAB — VITAMIN B1: Vitamin B1 (Thiamine): 124.1 nmol/L (ref 66.5–200.0)

## 2024-03-16 MED ORDER — ENSURE PLUS HIGH PROTEIN PO LIQD
237.0000 mL | Freq: Two times a day (BID) | ORAL | Status: DC
  Administered 2024-03-16 – 2024-03-21 (×10): 237 mL via ORAL

## 2024-03-16 MED ORDER — PREGABALIN 75 MG PO CAPS
150.0000 mg | ORAL_CAPSULE | Freq: Two times a day (BID) | ORAL | Status: DC
  Administered 2024-03-16 – 2024-03-21 (×10): 150 mg via ORAL
  Filled 2024-03-16 (×10): qty 2

## 2024-03-16 MED ADMIN — Pregabalin Cap 100 MG: 100 mg | ORAL | NDC 50228035390

## 2024-03-16 NOTE — Evaluation (Signed)
 Occupational Therapy Evaluation Patient Details Name: Sander Remedios MRN: [id number] DOB: 1956-04-02 Today's Date: 03/16/2024   History of Present Illness   Pt is a 68 y/o male presenting 10/15 to the ER with worsening pain in the LE's.  Subsequently pt has been having watery stools.  Work up for colitis and intervention for acute neuropathic pain.  ETOH use, DM, Hep C, HTN,     Clinical Impressions At baseline, pt is Mod I for ADLs, light meal prep, light home management tasks, and functional mobility with either Rollator, SPC, or furniture walking with pt reporting 6 falls in the past 6 months. Pt receives assistance for deep cleaning home and uses [employer] for transportation. Pt now presents with decreased activity tolerance, decreased balance, impaired cognition (uncertain of baseline), generalized B UE weakness, decreased B UE fine motor coordination, and decreased safety and independence with functional tasks. Pt currently demonstrating ability to complete ADLs largely with Set up to Max assist, bed mobility with Mod I to Contact guard assist, and functional transfers with a RW with Mod assist. Pt participated well in session and is motivated to participate with skilled rehab. Pt VSS on RA throughout session. Pt will benefit from acute OT to address deficits and increase safety and independence with functional tasks. Post acute discharge, pt will benefit from intensive inpatient skilled rehab services < 3 hours per day to maximize rehab potential.      If plan is discharge home, recommend the following:   A lot of help with walking and/or transfers;A lot of help with bathing/dressing/bathroom;Assistance with cooking/housework;Direct supervision/assist for medications management;Direct supervision/assist for financial management;Assist for transportation;Help with stairs or ramp for entrance     Functional Status Assessment   Patient has had a recent decline in their functional status and  demonstrates the ability to make significant improvements in function in a reasonable and predictable amount of time.     Equipment Recommendations   BSC/3in1;Wheelchair (measurements OT);Wheelchair cushion (measurements OT)     Recommendations for Other Services         Precautions/Restrictions   Precautions Precautions: Fall Recall of Precautions/Restrictions: Impaired Restrictions Weight Bearing Restrictions Per Provider Order: No     Mobility Bed Mobility Overal bed mobility: Needs Assistance Bed Mobility: Supine to Sit, Sit to Supine     Supine to sit: Modified independent (Device/Increase time), HOB elevated, Used rails Sit to supine: Contact guard assist, HOB elevated, Used rails   General bed mobility comments: Mod I to come to sit at EOB with increased time and effort; CGA needed to elevate B LE back into bed    Transfers Overall transfer level: Needs assistance Equipment used: Rolling walker (2 wheels) Transfers: Sit to/from Stand, Bed to chair/wheelchair/BSC Sit to Stand: Mod assist, From elevated surface     Step pivot transfers: Mod assist, From elevated surface     General transfer comment: significant assist for boost due to elevated neuropathic pain in B LE; cues for hand placement, technique, and safety      Balance Overall balance assessment: Needs assistance Sitting-balance support: Bilateral upper extremity supported, No upper extremity supported, Feet supported Sitting balance-Leahy Scale: Fair (Poor to Fair) Sitting balance - Comments: pt can only maintain sitting EOB without UE's for a short period and then slowly falls posteriorly.  To stay up, pt must use both UE's to prop; able to self-correct with verbal cues Postural control: Posterior lean Standing balance support: Bilateral upper extremity supported, Reliant on assistive device for balance, During functional  activity Standing balance-Leahy Scale: Poor Standing balance comment:  reliant on RW /external support due to pain/neuropathy                           ADL either performed or assessed with clinical judgement   ADL Overall ADL's : Needs assistance/impaired Eating/Feeding: Set up;Sitting   Grooming: Set up;Sitting   Upper Body Bathing: Set up;Contact guard assist;Sitting   Lower Body Bathing: Maximal assistance;Sit to/from stand;Supervison/ safety;Set up;Sitting/lateral leans Lower Body Bathing Details (indicate cue type and reason): able to wash/dry feet in sitting with increased time and Sueprvision-CGA; requires Max assist for peri care in standing Upper Body Dressing : Contact guard assist;Sitting   Lower Body Dressing: Contact guard assist;Sitting/lateral leans;Maximal assistance;Sit to/from stand Lower Body Dressing Details (indicate cue type and reason): Min assist to donn/doff socks in sitting; Max assist to bring clothing up/down over hips in standing Toilet Transfer: Moderate assistance;Rolling walker (2 wheels);BSC/3in1 (step-pivot) Toilet Transfer Details (indicate cue type and reason): simulated at EOB Toileting- Clothing Manipulation and Hygiene: Maximal assistance;Sit to/from stand         General ADL Comments: Pt with decreased activity tolerance and pain affecting funcitonal level     Vision Baseline Vision/History: 1 Wears glasses (readers) Ability to See in Adequate Light: 0 Adequate Patient Visual Report: No change from baseline Vision Assessment?: No apparent visual deficits;Wears glasses for reading     Perception         Praxis         Pertinent Vitals/Pain Pain Assessment Pain Assessment: 0-10 Pain Score: 9  Pain Location: B feet (worse in standing/stepping); lower back with activity Pain Descriptors / Indicators: Sharp, Discomfort, Grimacing, Guarding Pain Intervention(s): Limited activity within patient's tolerance, Monitored during session, Repositioned     Extremity/Trunk Assessment Upper Extremity  Assessment Upper Extremity Assessment: Right hand dominant;Generalized weakness;RUE deficits/detail;LUE deficits/detail RUE Deficits / Details: generalized weakness; impaired sensation 2/2 hx of neuropathy; decreased fine motor coordination RUE Sensation: history of peripheral neuropathy RUE Coordination: decreased fine motor LUE Deficits / Details: generalized weakness; impaired sensation 2/2 hx of neuropathy; decreased fine motor coordination LUE Sensation: history of peripheral neuropathy LUE Coordination: decreased fine motor   Lower Extremity Assessment Lower Extremity Assessment: Defer to PT evaluation   Cervical / Trunk Assessment Cervical / Trunk Assessment: Normal;Other exceptions Cervical / Trunk Exceptions: pt reports hx of C1-C2 surgery   Communication Communication Communication: No apparent difficulties   Cognition Arousal: Alert Behavior During Therapy: WFL for tasks assessed/performed Cognition: Cognition impaired, No family/caregiver present to determine baseline     Awareness: Intellectual awareness intact, Online awareness intact (intermittent online awareness)   Attention impairment (select first level of impairment): Selective attention (easily internally distracted) Executive functioning impairment (select all impairments): Reasoning, Problem solving OT - Cognition Comments: Pt AAOx4 and pleasant throughout session. Pt easily internally distracted and tangential in conversation requiring frequent redirection to attend to tasks.                 Following commands: Intact       Cueing  General Comments   Cueing Techniques: Verbal cues;Visual cues  VSS on RA   Exercises     Shoulder Instructions      Home Living Family/patient expects to be discharged to:: Private residence Living Arrangements: Other relatives (nephew) Available Help at Discharge:  (nephew works 2 jobs and is typically away from the house 16+ hours per day; other family is out  of state in [place], [place], and [place]) Type of Home: House Home Access: Stairs to enter     Home Layout: Two level;Bed/bath upstairs Alternate Level Stairs-Number of Steps: 13 Alternate Level Stairs-Rails: Left (pt holds onto the wall on the Right coming up stairs) Bathroom Shower/Tub: Producer, television/film/video: Handicapped height Bathroom Accessibility: Yes How Accessible: Accessible via walker Home Equipment: Rolling Walker (2 wheels);Rollator (4 wheels);Cane - quad;Cane - single point;Shower seat - built in;Toilet riser   Additional Comments: Pt reports his daughter and ex-wife are planning a visit for a few days in early November to assist pt with deep cleaning his home.      Prior Functioning/Environment Prior Level of Function : Independent/Modified Independent;Needs assist;History of Falls (last six months)             Mobility Comments: Pt reports holding on to the wall when ambulating upstairs, use of Rollator downstairs, and use of SPC in the community. Pt reports 6 falls in the past 6 months with fall down stairs about 6 months ago leading to L ankle fx; pt reports increasing dificulty with navigating stairs over the past few months ADLs Comments: Pt performs ADLs Mod I and reports particular difficulty with clothing fasteners due to neuropathy/decreased fine motor coordination in hands; receives assist of nephew for meal prep and most home management tasks with pt reheating food when nephew is out of the home; pt uses [employer] for transportation; pt reports fear of slipping shower as he has previosuly fallen 2x when slipping on water in the kitchen.    OT Problem List: Decreased strength;Decreased activity tolerance;Impaired balance (sitting and/or standing);Decreased coordination;Decreased knowledge of use of DME or AE;Decreased knowledge of precautions;Decreased cognition;Pain;Impaired sensation   OT Treatment/Interventions: Self-care/ADL training;Therapeutic exercise;Energy  conservation;DME and/or AE instruction;Therapeutic activities;Cognitive remediation/compensation;Patient/family education;Balance training      OT Goals(Current goals can be found in the care plan section)   Acute Rehab OT Goals Patient Stated Goal: to have less pain and be able to go up/down the stairs in his house to access his bedroom and bathroom OT Goal Formulation: With patient Time For Goal Achievement: 03/30/24 Potential to Achieve Goals: Good ADL Goals Pt Will Perform Grooming: with contact guard assist;standing Pt Will Perform Lower Body Bathing: with min assist;sitting/lateral leans;sit to/from stand (with adaptive equipment as needed) Pt Will Perform Upper Body Dressing: with modified independence;sitting (including clothing fasteners; with adaptive equipment as needed) Pt Will Perform Lower Body Dressing: with min assist;sitting/lateral leans;sit to/from stand (including clothing fasteners; with adaptive equipment as needed) Pt Will Transfer to Toilet: with min assist;ambulating;regular height toilet (with least restrictive AD) Pt Will Perform Toileting - Clothing Manipulation and hygiene: with min assist;sit to/from stand   OT Frequency:  Min 2X/week    Co-evaluation              AM-PAC OT 6 Clicks Daily Activity     Outcome Measure Help from another person eating meals?: A Little Help from another person taking care of personal grooming?: A Little Help from another person toileting, which includes using toliet, bedpan, or urinal?: A Lot Help from another person bathing (including washing, rinsing, drying)?: A Lot Help from another person to put on and taking off regular upper body clothing?: A Little Help from another person to put on and taking off regular lower body clothing?: A Lot 6 Click Score: 15   End of Session Equipment Utilized During Treatment: Rolling walker (2 wheels);Gait belt Nurse Communication: Mobility status;Other (comment) (  Pt limited by B  LE pain)  Activity Tolerance: Patient tolerated treatment well;Patient limited by pain Patient left: in bed;with call bell/phone within reach;with bed alarm set  OT Visit Diagnosis: Unsteadiness on feet (R26.81);Other abnormalities of gait and mobility (R26.89);Muscle weakness (generalized) (M62.81);History of falling (Z91.81);Ataxia, unspecified (R27.0);Other symptoms and signs involving cognitive function;Pain                Time: 8985-8955 OT Time Calculation (min): 30 min Charges:  OT General Charges $OT Visit: 1 Visit OT Evaluation $OT Eval Moderate Complexity: 1 Mod OT Treatments $Self Care/Home Management : 8-22 mins  Margarie Rockey HERO., OTR/L, MA Acute Rehab [phone]   Margarie FORBES Horns 03/16/2024, 1:24 PM

## 2024-03-16 NOTE — Progress Notes (Signed)
 PROGRESS NOTE    Kenneth Joseph  FMW:[id number] DOB: 05-22-1956 DOA: 03/13/2024 PCP: Kenneth Pellet, MD    Brief Narrative:  Kenneth Joseph is a 68 y.o. male with history of diabetes mellitus type 2, hypertension, hyperlipidemia, B12 deficiency presents to the ER because of worsening pain in the lower extremities.  Patient had been having some tingling and numbness for last few months and had followed up with neurologist in June 2025 for dizziness when neurologist did diagnosed with with the likely from alcohol induced and had offered nerve studies but at that time per the notes patient had declined comes to the ER because of worsening pain in the bilateral lower extremity which has acutely worsened last 4 days.  Patient is finding it difficult to ambulate because of the pain.  Patient had recently traveled to [place]  last week and after returning over the last 4 days patient also has been having watery diarrhea.    Assessment and Plan: Incidental finding of colitis -     cause not clear was transiently hypotensive could be related to dehydration of the morphine which patient received. 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  At that time patient had declined any nerve studies.  Dr. Franky discussed with on-call neurologist Dr. Voncile who felt patient's neuropathy could be from alcohol.  Will need outpatient nerve studies okay to start Lyrica-adjust dose for better control -Replete folate  Hypertension - Resume home meds  Diabetes mellitus type 2 takes metformin and glipizide presently on sliding scale coverage.  Hemoglobin A1c today is 5.5.  B12 deficiency  -takes B12 shots every month.  Alcohol abuse has not had any alcohol last 3 days because patient was finding it difficult to ambulate because of the pain. - CIWA  Hyperlipidemia  takes statins.  doubt cholecystitis  Elevated bilirubin  - Trend  History of gout  - No signs of any gouty exacerbation.  Macrocytosis  - Replete folic acid   DVT prophylaxis: enoxaparin (LOVENOX) injection 40 mg Start: 03/14/24 1000    Code Status: Full Code   Disposition Plan:  Level of care: Telemetry Medical Status is: Observation     Consultants:  Neurology (phone)  Subjective: Overall pain better but pain does worsen when he stands up  Objective: Vitals:   03/16/24 0530 03/16/24 0814 03/16/24 1135 03/16/24 1245  BP: 103/79 102/76  128/79  Pulse: 86 92  90  Resp: 20 20  20   Temp: 98.6 F (37 C) 97.9 F (36.6 C)  99.2 F (37.3 C)  TempSrc: Oral Oral  Oral  SpO2: 96% (!) 88%  99%  Weight:   77.4 kg     Intake/Output Summary (Last 24 hours) at 03/16/2024 1456 Last data filed at 03/16/2024 0534 Gross per 24 hour  Intake 600 ml  Output 500 ml  Net 100 ml   Filed Weights   03/16/24 1135  Weight: 77.4 kg    Examination:   General: Appearance:     Overweight male in no acute distress     Lungs:     respirations unlabored  Heart:    Normal heart rate. .    MS:   All extremities are intact.    Neurologic:   Awake, alert       Data Reviewed: I have personally reviewed following labs and imaging studies  CBC: Recent Labs  Lab  03/13/24 1315 03/13/24 1848 03/14/24 0330 03/15/24 0204  WBC 10.2  --  9.1 10.0  NEUTROABS 7.4  --   --   --   HGB 14.0 14.3 13.1 11.8*  HCT 40.5 42.0 39.3 35.2*  MCV 106.0*  --  109.2* 107.6*  PLT 104*  --  114* 126*   Basic Metabolic Panel: Recent Labs  Lab 03/13/24 1848 03/14/24 0039 03/14/24 0330 03/15/24 0204 03/16/24 0221  NA 142 139 139 136 137  K 3.8 3.3* 3.6 4.0 3.6  CL 99 102 104 102 100  CO2  --  26 24 23 25   GLUCOSE 139* 131* 133* 145* 163*  BUN 9 8 8 10 9   CREATININE 0.90 0.99 0.98 0.88 0.96  CALCIUM  --  6.9* 6.8* 7.0* 7.2*  MG  --   --  1.1* 2.0  --    GFR: Estimated Creatinine  Clearance: 73.9 mL/min (by C-G formula based on SCr of 0.96 mg/dL). Liver Function Tests: Recent Labs  Lab 03/14/24 0039 03/14/24 0330 03/16/24 0221  AST 28  27 28  42*  ALT 16  16 15 15   ALKPHOS 78  77 70 73  BILITOT 2.0*  2.0* 2.0* 0.5  PROT 6.1*  6.3* 5.9* 5.5*  ALBUMIN 2.1*  2.3* 2.0* 1.7*   Recent Labs  Lab 03/14/24 0039  LIPASE 23   No results for input(s): AMMONIA in the last 168 hours. Coagulation Profile: No results for input(s): INR, PROTIME in the last 168 hours. Cardiac Enzymes: No results for input(s): CKTOTAL, CKMB, CKMBINDEX, TROPONINI in the last 168 hours. BNP (last 3 results) No results for input(s): PROBNP in the last 8760 hours. HbA1C: Recent Labs    03/14/24 0039  HGBA1C 5.5   CBG: No results for input(s): GLUCAP in the last 168 hours. Lipid Profile: No results for input(s): CHOL, HDL, LDLCALC, TRIG, CHOLHDL, LDLDIRECT in the last 72 hours. Thyroid  Function Tests: Recent Labs    03/14/24 0039  TSH 1.248   Anemia Panel: Recent Labs    03/14/24 0330 03/15/24 0204  VITAMINB12  --  982*  FOLATE 3.1*  --    Sepsis Labs: No results for input(s): PROCALCITON, LATICACIDVEN in the last 168 hours.  No results found for this or any previous visit (from the past 240 hours).       Radiology Studies: No results found.       Scheduled Meds:  atorvastatin  80 mg Oral Daily   enoxaparin (LOVENOX) injection  40 mg Subcutaneous Daily   feeding supplement  237 mL Oral BID BM   folic acid  1 mg Oral Daily   multivitamin with minerals  1 tablet Oral Daily   pregabalin  150 mg Oral BID   thiamine  100 mg Oral Daily   Continuous Infusions:   LOS: 0 days    Time spent: 45 minutes spent on chart review, discussion with nursing staff, consultants, updating family and interview/physical exam; more than 50% of that time was spent in counseling and/or coordination of care.    Kenneth Joseph Bowl, DO [facility] Available via Epic secure chat 7am-7pm After these hours, please refer to coverage provider listed on [email or web address] 03/16/2024, 2:56 PM

## 2024-03-17 ENCOUNTER — Observation Stay (HOSPITAL_COMMUNITY)

## 2024-03-17 DIAGNOSIS — R2689 Other abnormalities of gait and mobility: Secondary | ICD-10-CM | POA: Diagnosis not present

## 2024-03-17 DIAGNOSIS — I959 Hypotension, unspecified: Secondary | ICD-10-CM | POA: Diagnosis not present

## 2024-03-17 DIAGNOSIS — M6281 Muscle weakness (generalized): Secondary | ICD-10-CM | POA: Diagnosis not present

## 2024-03-17 DIAGNOSIS — G621 Alcoholic polyneuropathy: Secondary | ICD-10-CM | POA: Diagnosis not present

## 2024-03-17 DIAGNOSIS — I10 Essential (primary) hypertension: Secondary | ICD-10-CM | POA: Diagnosis not present

## 2024-03-17 DIAGNOSIS — Z9181 History of falling: Secondary | ICD-10-CM | POA: Diagnosis not present

## 2024-03-17 DIAGNOSIS — E1142 Type 2 diabetes mellitus with diabetic polyneuropathy: Secondary | ICD-10-CM | POA: Diagnosis not present

## 2024-03-17 DIAGNOSIS — M7989 Other specified soft tissue disorders: Secondary | ICD-10-CM

## 2024-03-17 DIAGNOSIS — G629 Polyneuropathy, unspecified: Secondary | ICD-10-CM | POA: Diagnosis not present

## 2024-03-17 DIAGNOSIS — D7589 Other specified diseases of blood and blood-forming organs: Secondary | ICD-10-CM | POA: Diagnosis not present

## 2024-03-17 DIAGNOSIS — R279 Unspecified lack of coordination: Secondary | ICD-10-CM | POA: Diagnosis not present

## 2024-03-17 DIAGNOSIS — Z7984 Long term (current) use of oral hypoglycemic drugs: Secondary | ICD-10-CM | POA: Diagnosis not present

## 2024-03-17 DIAGNOSIS — Z818 Family history of other mental and behavioral disorders: Secondary | ICD-10-CM | POA: Diagnosis not present

## 2024-03-17 DIAGNOSIS — R0902 Hypoxemia: Secondary | ICD-10-CM | POA: Diagnosis not present

## 2024-03-17 DIAGNOSIS — E538 Deficiency of other specified B group vitamins: Secondary | ICD-10-CM | POA: Diagnosis not present

## 2024-03-17 DIAGNOSIS — A049 Bacterial intestinal infection, unspecified: Secondary | ICD-10-CM | POA: Diagnosis not present

## 2024-03-17 DIAGNOSIS — Z833 Family history of diabetes mellitus: Secondary | ICD-10-CM | POA: Diagnosis not present

## 2024-03-17 DIAGNOSIS — E7800 Pure hypercholesterolemia, unspecified: Secondary | ICD-10-CM | POA: Diagnosis not present

## 2024-03-17 DIAGNOSIS — R7989 Other specified abnormal findings of blood chemistry: Secondary | ICD-10-CM | POA: Diagnosis not present

## 2024-03-17 DIAGNOSIS — Z79899 Other long term (current) drug therapy: Secondary | ICD-10-CM | POA: Diagnosis not present

## 2024-03-17 DIAGNOSIS — K5730 Diverticulosis of large intestine without perforation or abscess without bleeding: Secondary | ICD-10-CM | POA: Diagnosis not present

## 2024-03-17 DIAGNOSIS — F1010 Alcohol abuse, uncomplicated: Secondary | ICD-10-CM | POA: Diagnosis not present

## 2024-03-17 DIAGNOSIS — R17 Unspecified jaundice: Secondary | ICD-10-CM | POA: Diagnosis not present

## 2024-03-17 DIAGNOSIS — Z791 Long term (current) use of non-steroidal anti-inflammatories (NSAID): Secondary | ICD-10-CM | POA: Diagnosis not present

## 2024-03-17 DIAGNOSIS — K529 Noninfective gastroenteritis and colitis, unspecified: Secondary | ICD-10-CM | POA: Diagnosis not present

## 2024-03-17 DIAGNOSIS — M109 Gout, unspecified: Secondary | ICD-10-CM | POA: Diagnosis not present

## 2024-03-17 DIAGNOSIS — Z8249 Family history of ischemic heart disease and other diseases of the circulatory system: Secondary | ICD-10-CM | POA: Diagnosis not present

## 2024-03-17 DIAGNOSIS — Z823 Family history of stroke: Secondary | ICD-10-CM | POA: Diagnosis not present

## 2024-03-17 DIAGNOSIS — E860 Dehydration: Secondary | ICD-10-CM | POA: Diagnosis not present

## 2024-03-17 DIAGNOSIS — D696 Thrombocytopenia, unspecified: Secondary | ICD-10-CM | POA: Diagnosis not present

## 2024-03-17 LAB — CBC
HCT: 34.4 % — ABNORMAL LOW (ref 39.0–52.0)
Hemoglobin: 11.6 g/dL — ABNORMAL LOW (ref 13.0–17.0)
MCH: 36.1 pg — ABNORMAL HIGH (ref 26.0–34.0)
MCHC: 33.7 g/dL (ref 30.0–36.0)
MCV: 107.2 fL — ABNORMAL HIGH (ref 80.0–100.0)
Platelets: 184 K/uL (ref 150–400)
RBC: 3.21 MIL/uL — ABNORMAL LOW (ref 4.22–5.81)
RDW: 15 % (ref 11.5–15.5)
WBC: 7.9 K/uL (ref 4.0–10.5)
nRBC: 0 % (ref 0.0–0.2)

## 2024-03-17 LAB — COMPREHENSIVE METABOLIC PANEL WITH GFR
ALT: 17 U/L (ref 0–44)
AST: 43 U/L — ABNORMAL HIGH (ref 15–41)
Albumin: 1.6 g/dL — ABNORMAL LOW (ref 3.5–5.0)
Alkaline Phosphatase: 71 U/L (ref 38–126)
Anion gap: 11 (ref 5–15)
BUN: 11 mg/dL (ref 8–23)
CO2: 25 mmol/L (ref 22–32)
Calcium: 8 mg/dL — ABNORMAL LOW (ref 8.9–10.3)
Chloride: 101 mmol/L (ref 98–111)
Creatinine, Ser: 1.01 mg/dL (ref 0.61–1.24)
GFR, Estimated: >60 mL/min (ref >60)
Glucose, Bld: 179 mg/dL — ABNORMAL HIGH (ref 70–99)
Potassium: 4 mmol/L (ref 3.5–5.1)
Sodium: 137 mmol/L (ref 135–145)
Total Bilirubin: 0.6 mg/dL (ref 0.0–1.2)
Total Protein: 5.6 g/dL — ABNORMAL LOW (ref 6.5–8.1)

## 2024-03-17 MED ORDER — PIPERACILLIN-TAZOBACTAM 3.375 G IVPB
3.3750 g | Freq: Three times a day (TID) | INTRAVENOUS | Status: DC
  Administered 2024-03-17 – 2024-03-20 (×9): 3.375 g via INTRAVENOUS
  Filled 2024-03-17 (×9): qty 50

## 2024-03-17 NOTE — Progress Notes (Addendum)
 Pharmacy Antibiotic Note  Kenneth Joseph is a 68 y.o. male admitted on 03/13/2024 with colitis/intra-abdominal infection.  Pharmacy has been consulted for Zosyn dosing on 10/19.  CT abd on 10/15 showed  colitis with suspicion for cholecystitis. US  on 10/16 showed no acute cholecystitis. Patient developed a fever of 100.8 F overnight on 10/18.  Plan: Zosyn 3.375g IV q8h (4 hour infusion). Monitor cultures, clinical status, and renal function Narrow abx as able and f/u duration  .  Weight: 77.4 kg (170 lb 10.2 oz)  Temp (24hrs), Avg:99.2 F (37.3 C), Min:98.5 F (36.9 C), Max:100.8 F (38.2 C)  Recent Labs  Lab 03/13/24 1315 03/13/24 1848 03/14/24 0039 03/14/24 0330 03/15/24 0204 03/16/24 0221  WBC 10.2  --   --  9.1 10.0  --   CREATININE  --  0.90 0.99 0.98 0.88 0.96    Estimated Creatinine Clearance: 73.9 mL/min (by C-G formula based on SCr of 0.96 mg/dL).    Allergies  Allergen Reactions   Colchicine Diarrhea and Nausea And Vomiting    Antimicrobials this admission: none  Dose adjustments this admission: none  Microbiology results: none  Thank you for allowing pharmacy to be a part of this patient's care. WENDI Amon Rocher, PharmD PGY-1 Pharmacy Resident [facility] 03/17/2024 12:07 PM

## 2024-03-17 NOTE — Progress Notes (Signed)
 PROGRESS NOTE    Kenneth Joseph  FMW:[id number] DOB: 1956/03/13 DOA: 03/13/2024 PCP: Claudene Pellet, MD    Brief Narrative:  Kenneth Joseph is a 68 y.o. male with history of diabetes mellitus type 2, hypertension, hyperlipidemia, B12 deficiency presents to the ER because of worsening pain in the lower extremities.  Patient had been having some tingling and numbness for last few months and had followed up with neurologist in June 2025 for dizziness when neurologist did diagnosed with with the likely from alcohol induced and had offered nerve studies but at that time per the notes patient had declined comes to the ER because of worsening pain in the bilateral lower extremity which has acutely worsened last 4 days.  Patient is finding it difficult to ambulate because of the pain.  Patient had recently traveled to [place]  last week and after returning over the last 4 days patient also has been having watery diarrhea.    Assessment and Plan: Incidental finding of colitis -     cause not clear was transiently hypotensive could be related to dehydration of the morphine which patient received. 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  At that time patient had declined any nerve studies.  Dr. Franky discussed with on-call neurologist Dr. Voncile who felt patient's neuropathy could be from alcohol.  Will need outpatient nerve studies, okay to start Lyrica-adjust dose for better control -Replete folate  Hypertension - Blood pressure on lower side so we will hold medications  Diabetes mellitus type 2 takes metformin and glipizide presently on sliding scale coverage.  Hemoglobin A1c today is  5.5.  B12 deficiency  -takes B12 shots every month.  Alcohol abuse has not had any alcohol last 3 days because patient was finding it difficult to ambulate because of the pain. - CIWA  Hyperlipidemia takes statins.    Elevated bilirubin  - Trend  History of gout  - No signs of any gouty exacerbation.  Macrocytosis  - Replete folic acid   DVT prophylaxis: enoxaparin (LOVENOX) injection 40 mg Start: 03/14/24 1000    Code Status: Full Code   Disposition Plan:  Level of care: Telemetry Medical Status is: Observation     Consultants:  Neurology (phone)  Subjective: Overnight he had a fever  Objective: Vitals:   03/16/24 2344 03/17/24 0513 03/17/24 0752 03/17/24 1221  BP: 120/80 118/89 113/68 (!) 89/73  Pulse: 90 89 (!) 102 97  Resp: 20 20 16 16   Temp: 98.6 F (37 C) 98.5 F (36.9 C) 99.1 F (37.3 C) 100.1 F (37.8 C)  TempSrc: Oral Oral Oral Oral  SpO2: 95% 99% 95% 98%  Weight:        Intake/Output Summary (Last 24 hours) at 03/17/2024 1336 Last data filed at 03/17/2024 0513 Gross per 24 hour  Intake 240 ml  Output 500 ml  Net -260 ml   Filed Weights   03/16/24 1135  Weight: 77.4 kg    Examination:   General: Appearance:     Overweight male in no acute distress   No apparent cellulitis on lower extremity  Lungs:     respirations unlabored  Heart:    Normal heart rate. .    MS:   All extremities are  intact.    Neurologic:   Awake, alert       Data Reviewed: I have personally reviewed following labs and imaging studies  CBC: Recent Labs  Lab 03/13/24 1315 03/13/24 1848 03/14/24 0330 03/15/24 0204 03/17/24 1200  WBC 10.2  --  9.1 10.0 7.9  NEUTROABS 7.4  --   --   --   --   HGB 14.0 14.3 13.1 11.8* 11.6*  HCT 40.5 42.0 39.3 35.2* 34.4*  MCV 106.0*  --  109.2* 107.6* 107.2*  PLT 104*  --  114* 126* 184   Basic Metabolic Panel: Recent Labs  Lab 03/14/24 0039 03/14/24 0330 03/15/24 0204 03/16/24 0221 03/17/24 1200  NA  139 139 136 137 137  K 3.3* 3.6 4.0 3.6 4.0  CL 102 104 102 100 101  CO2 26 24 23 25 25   GLUCOSE 131* 133* 145* 163* 179*  BUN 8 8 10 9 11   CREATININE 0.99 0.98 0.88 0.96 1.01  CALCIUM 6.9* 6.8* 7.0* 7.2* 8.0*  MG  --  1.1* 2.0  --   --    GFR: Estimated Creatinine Clearance: 70.2 mL/min (by C-G formula based on SCr of 1.01 mg/dL). Liver Function Tests: Recent Labs  Lab 03/14/24 0039 03/14/24 0330 03/16/24 0221 03/17/24 1200  AST 28  27 28  42* 43*  ALT 16  16 15 15 17   ALKPHOS 78  77 70 73 71  BILITOT 2.0*  2.0* 2.0* 0.5 0.6  PROT 6.1*  6.3* 5.9* 5.5* 5.6*  ALBUMIN 2.1*  2.3* 2.0* 1.7* 1.6*   Recent Labs  Lab 03/14/24 0039  LIPASE 23   No results for input(s): AMMONIA in the last 168 hours. Coagulation Profile: No results for input(s): INR, PROTIME in the last 168 hours. Cardiac Enzymes: No results for input(s): CKTOTAL, CKMB, CKMBINDEX, TROPONINI in the last 168 hours. BNP (last 3 results) No results for input(s): PROBNP in the last 8760 hours. HbA1C: No results for input(s): HGBA1C in the last 72 hours.  CBG: No results for input(s): GLUCAP in the last 168 hours. Lipid Profile: No results for input(s): CHOL, HDL, LDLCALC, TRIG, CHOLHDL, LDLDIRECT in the last 72 hours. Thyroid  Function Tests: No results for input(s): TSH, T4TOTAL, FREET4, T3FREE, THYROIDAB in the last 72 hours.  Anemia Panel: Recent Labs    03/15/24 0204  VITAMINB12 982*   Sepsis Labs: No results for input(s): PROCALCITON, LATICACIDVEN in the last 168 hours.  No results found for this or any previous visit (from the past 240 hours).       Radiology Studies: DG CHEST PORT 1 VIEW Result Date: 03/17/2024 CLINICAL DATA:  Fever. EXAM: PORTABLE CHEST 1 VIEW COMPARISON:  03/13/2024 FINDINGS: Lungs are adequately inflated and otherwise clear. Cardiomediastinal silhouette and remainder of the exam is unchanged. IMPRESSION: No active disease.  Electronically Signed   By: Toribio Agreste M.D.   On: 03/17/2024 10:46         Scheduled Meds:  atorvastatin  80 mg Oral Daily   enoxaparin (LOVENOX) injection  40 mg Subcutaneous Daily   feeding supplement  237 mL Oral BID BM   folic acid  1 mg Oral Daily   multivitamin with minerals  1 tablet Oral Daily   pregabalin  150 mg Oral BID   thiamine  100 mg Oral Daily   Continuous Infusions:  piperacillin-tazobactam (ZOSYN)  IV       LOS: 0 days    Time spent: 45 minutes spent on chart review, discussion with nursing  staff, consultants, updating family and interview/physical exam; more than 50% of that time was spent in counseling and/or coordination of care.    Harlene RAYMOND Bowl, DO [facility] Available via Epic secure chat 7am-7pm After these hours, please refer to coverage provider listed on [email or web address] 03/17/2024, 1:36 PM

## 2024-03-17 NOTE — Plan of Care (Signed)

## 2024-03-17 NOTE — Progress Notes (Deleted)
 Pharmacy Antibiotic Note  Kenneth Joseph is a 68 y.o. male admitted on 03/13/2024 with colitis/intra-abdominal infection.  Pharmacy has been consulted for Zosyn dosing.  CT abd on 10/15 showed  colitis with suspicion for cholecystitis. US  on 10/16 showed no acute cholecystitis. Per provider, patient developed a fever on 10/19, prompting initiation of antibiotics.  Plan: Zosyn 3.375g IV q8h (4 hour infusion). Monitor cultures, clinical status, and renal function Narrow abx as able and f/u duration  .  Weight: 77.4 kg (170 lb 10.2 oz)  Temp (24hrs), Avg:99.2 F (37.3 C), Min:98.5 F (36.9 C), Max:100.8 F (38.2 C)  Recent Labs  Lab 03/13/24 1315 03/13/24 1848 03/14/24 0039 03/14/24 0330 03/15/24 0204 03/16/24 0221  WBC 10.2  --   --  9.1 10.0  --   CREATININE  --  0.90 0.99 0.98 0.88 0.96    Estimated Creatinine Clearance: 73.9 mL/min (by C-G formula based on SCr of 0.96 mg/dL).    Allergies  Allergen Reactions   Colchicine Diarrhea and Nausea And Vomiting    Antimicrobials this admission: none  Dose adjustments this admission: none  Microbiology results: none  Thank you for allowing pharmacy to be a part of this patient's care. WENDI Amon Rocher, PharmD PGY-1 Pharmacy Resident [facility] 03/17/2024 11:09 AM

## 2024-03-17 NOTE — Progress Notes (Signed)
 VASCULAR LAB    Bilateral lower extremity venous duplex has been performed.  See CV proc for preliminary results.   Floreen Teegarden, RVT 03/17/2024, 2:51 PM

## 2024-03-18 DIAGNOSIS — K529 Noninfective gastroenteritis and colitis, unspecified: Secondary | ICD-10-CM | POA: Diagnosis not present

## 2024-03-18 NOTE — Plan of Care (Signed)
 Pt educated on importance of turning from side to side to prevent pressure injuries. Pt stated, he understands but continues to refused multiple turns at night.    Problem: Education: Goal: Knowledge of General Education information will improve Description: Including pain rating scale, medication(s)/side effects and non-pharmacologic comfort measures Outcome: Progressing   Problem: Health Behavior/Discharge Planning: Goal: Ability to manage health-related needs will improve Outcome: Progressing   Problem: Clinical Measurements: Goal: Ability to maintain clinical measurements within normal limits will improve Outcome: Progressing Goal: Will remain free from infection Outcome: Progressing   Problem: Nutrition: Goal: Adequate nutrition will be maintained Outcome: Progressing   Problem: Coping: Goal: Level of anxiety will decrease Outcome: Progressing

## 2024-03-18 NOTE — TOC Initial Note (Signed)
 Transition of Care [facility]) - Initial/Assessment Note    Patient Details  Name: Kenneth Joseph MRN: [id number] Date of Birth: [birth year]  [facility] CM/SW Contact:    Sherline Clack, LCSWA Phone Number: 03/18/2024, 11:43 AM  Clinical Narrative:                  CSW spoke with patient regarding anticipated discharge plan to SNF. Patient is agreeable to continuing physical therapy at a SNF and requested CSW send out his information to facilities in [place]. CSW will fax out patient information in the hub. CSW will continue to follow.   Expected Discharge Plan: Skilled Nursing Facility Barriers to Discharge: SNF Pending bed offer   Patient Goals and CMS Choice     Choice offered to / list presented to : Patient      Expected Discharge Plan and Services       Living arrangements for the past 2 months: Single Family Home                                      Prior Living Arrangements/Services Living arrangements for the past 2 months: Single Family Home   Patient language and need for interpreter reviewed:: Yes                 Activities of Daily Living   ADL Screening (condition at time of admission) Independently performs ADLs?: No Does the patient have a NEW difficulty with bathing/dressing/toileting/self-feeding that is expected to last >3 days?: Yes (Initiates electronic notice to provider for possible OT consult) Does the patient have a NEW difficulty with getting in/out of bed, walking, or climbing stairs that is expected to last >3 days?: Yes (Initiates electronic notice to provider for possible PT consult) Does the patient have a NEW difficulty with communication that is expected to last >3 days?: No Is the patient deaf or have difficulty hearing?: No Does the patient have difficulty seeing, even when wearing glasses/contacts?: No Does the patient have difficulty concentrating, remembering, or making decisions?:  No  Permission Sought/Granted                  Emotional Assessment Appearance:: Appears stated age Attitude/Demeanor/Rapport: Engaged Affect (typically observed): Appropriate Orientation: : Oriented to Self, Oriented to Place, Oriented to  Time, Oriented to Situation      Admission diagnosis:  Dehydration [E86.0] Colitis [K52.9] Thrombocytopenia [D69.6] Peripheral polyneuropathy [G62.9] Diarrhea, unspecified type [R19.7] Patient Active Problem List   Diagnosis Date Noted   Colitis 03/14/2024   Alcohol abuse 03/14/2024   Elevated ferritin 02/19/2024   Low vitamin D level 02/19/2024   Abnormal level of blood mineral 02/19/2024   Current drinker of alcohol 02/19/2024   Diverticulosis of colon 02/19/2024   Gout 02/19/2024   Hardening of the aorta (main artery of the heart) 02/19/2024   Hypercholesterolemia 02/19/2024   Hypertension 02/19/2024   Hypocalcemia 02/19/2024   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   Left lower quadrant abdominal pain 02/19/2024   Loss of appetite 02/19/2024   Macrocytosis 02/19/2024   Peripheral polyneuropathy 02/19/2024   Diabetes (HCC) 02/19/2024   Type 2 diabetes mellitus with other specified complication (HCC) 02/19/2024   Type 2 diabetes mellitus without complications (HCC) 02/19/2024   Vitamin B12 deficiency 02/19/2024   PCP:  Claudene Pellet, MD Pharmacy:   [id number] - [place], [place] - [address]  [place] [address] Phone: [phone] Fax: [phone]     Social Drivers of Health (SDOH) Social History: SDOH Screenings   Food Insecurity: No Food Insecurity (03/14/2024)  Housing: Low Risk  (03/14/2024)  Transportation Needs: No Transportation Needs (03/14/2024)  Utilities: Not At Risk (03/14/2024)  Depression (PHQ2-9): Low Risk  (02/19/2024)  Social Connections: Unknown (03/14/2024)  Tobacco Use: Low Risk  (03/14/2024)   SDOH Interventions:     Readmission Risk Interventions      No data to display

## 2024-03-18 NOTE — Progress Notes (Signed)
 Physical Therapy Treatment Patient Details Name: Kenneth Joseph MRN: [id number] DOB: [birth year] Today's Date: 03/18/2024   History of Present Illness Pt is a 69 y/o male presenting 10/15 to the ER with worsening pain in the LE's.  Subsequently pt has been having watery stools.  Work up for colitis and intervention for acute neuropathic pain.  ETOH use, DM, Hep C, HTN,    PT Comments  Pt relates that he is looking forward to trying to walk today as the pain in his feet is somewhat less today. Pt is able to come to EoB with mod I. Once there pt with increased posterior lean but able to steady himself with B UE support. Pt requires modA for transfers and min A for ambulation. Pt able to walk to door but asks to return to bed due to increasing B foot neuropathic pain. Pt returns to supine without assist and reports he is happy with his progress. D/c plan remains appropriate. PT will continue to follow acutely.     If plan is discharge home, recommend the following: A lot of help with bathing/dressing/bathroom;A lot of help with walking and/or transfers;Assistance with cooking/housework;Assist for transportation;Help with stairs or ramp for entrance   Can travel by private vehicle     No  Equipment Recommendations  BSC/3in1;Wheelchair (measurements PT);Wheelchair cushion (measurements PT)       Precautions / Restrictions Precautions Precautions: Fall Recall of Precautions/Restrictions: Intact Restrictions Weight Bearing Restrictions Per Provider Order: No     Mobility  Bed Mobility Overal bed mobility: Modified Independent             General bed mobility comments: not very efficient sequencing, but able to complete without assist    Transfers Overall transfer level: Needs assistance   Transfers: Sit to/from Stand, Bed to chair/wheelchair/BSC Sit to Stand: Mod assist           General transfer comment: good initiation, increased assist to come to fully upright and steady  in walker    Ambulation/Gait Ambulation/Gait assistance: Min assist Gait Distance (Feet): 16 Feet Assistive device: Rolling walker (2 wheels) Gait Pattern/deviations: Step-through pattern, Decreased step length - right, Decreased step length - left, Shuffle, Trunk flexed, Wide base of support Gait velocity: slowed Gait velocity interpretation: <1.31 ft/sec, indicative of household ambulator   General Gait Details: min A for steadying, cues for upright posture and proximity to RW, increased foot pain with distance         Balance Overall balance assessment: Needs assistance Sitting-balance support: Bilateral upper extremity supported, No upper extremity supported, Feet supported Sitting balance-Leahy Scale: Fair Sitting balance - Comments: benefits from UE support due to posterior lean Postural control: Posterior lean Standing balance support: Bilateral upper extremity supported, Reliant on assistive device for balance, During functional activity Standing balance-Leahy Scale: Poor Standing balance comment: reliant on RW /external support due to pain/neuropathy                            Communication Communication Communication: No apparent difficulties  Cognition Arousal: Alert Behavior During Therapy: WFL for tasks assessed/performed   PT - Cognitive impairments: No apparent impairments                         Following commands: Intact      Cueing Cueing Techniques: Verbal cues     General Comments General comments (skin integrity, edema, etc.): VSS on RA  Pertinent Vitals/Pain Pain Assessment Pain Assessment: 0-10 Pain Score: 6  Pain Location: bil feet Pain Descriptors / Indicators: Sharp Pain Intervention(s): Limited activity within patient's tolerance, Monitored during session, Repositioned     PT Goals (current goals can now be found in the care plan section) Acute Rehab PT Goals Patient Stated Goal: lessen pain.  Figure out my  options. PT Goal Formulation: With patient Time For Goal Achievement: 03/28/24 Potential to Achieve Goals: Fair Progress towards PT goals: Progressing toward goals    Frequency    Min 3X/week       AM-PAC PT 6 Clicks Mobility   Outcome Measure  Help needed turning from your back to your side while in a flat bed without using bedrails?: None Help needed moving from lying on your back to sitting on the side of a flat bed without using bedrails?: A Little Help needed moving to and from a bed to a chair (including a wheelchair)?: A Lot Help needed standing up from a chair using your arms (e.g., wheelchair or bedside chair)?: A Little Help needed to walk in hospital room?: A Lot Help needed climbing 3-5 steps with a railing? : Total 6 Click Score: 15    End of Session Equipment Utilized During Treatment: Gait belt Activity Tolerance: Patient limited by pain Patient left: in bed;with call bell/phone within reach Nurse Communication: Mobility status PT Visit Diagnosis: Unsteadiness on feet (R26.81);Other abnormalities of gait and mobility (R26.89);Muscle weakness (generalized) (M62.81);Difficulty in walking, not elsewhere classified (R26.2);Pain Pain - Right/Left:  (both) Pain - part of body: Ankle and joints of foot     Time: 8491-8477 PT Time Calculation (min) (ACUTE ONLY): 14 min  Charges:    $Therapeutic Activity: 8-22 mins                       Dashanna Kinnamon B. Fleeta Lapidus PT, DPT Acute Rehabilitation Services Please use secure chat or  Call Office [phone]    Almarie KATHEE Fleeta [facility] 03/18/2024, 3:36 PM

## 2024-03-18 NOTE — Progress Notes (Signed)
 PROGRESS NOTE    Kenneth Joseph  FMW:[id number] DOB: Aug 11, 1955 DOA: 03/13/2024 PCP: Claudene Pellet, MD    Brief Narrative:  [name] is a 68 y.o. male with history of diabetes mellitus type 2, hypertension, hyperlipidemia, B12 deficiency presents to the ER because of worsening pain in the lower extremities.  Patient had been having some tingling and numbness for last few months and had followed up with neurologist in June 2025 for dizziness when neurologist did diagnosed with with the likely from alcohol induced and had offered nerve studies but at that time per the notes patient had declined comes to the ER because of worsening pain in the bilateral lower extremity which has acutely worsened last 4 days.  Patient is finding it difficult to ambulate because of the pain.  Patient had recently traveled to [place]  last week and after returning over the last 4 days patient also has been having watery diarrhea.  Seen by PT-needs SNF   Assessment and Plan: Incidental finding of colitis -     cause not clear was transiently hypotensive could be related to dehydration of the morphine which patient received. 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  At that time patient had declined any nerve studies.  Dr. Franky discussed with on-call neurologist Dr. Voncile who felt patient's neuropathy could be from alcohol.  Will need outpatient nerve studies, okay to start Lyrica-adjust dose for better control -Replete folate  Hypertension - Blood pressure on lower side so we will hold medications  Diabetes mellitus type 2 takes metformin and  glipizide presently on sliding scale coverage.  Hemoglobin A1c  is 5.5.  B12 deficiency  -takes B12 shots every month.  Alcohol abuse has not had any alcohol in 3 days prior to admission because patient was finding it difficult to ambulate because of the pain. - CIWA  Hyperlipidemia takes statins.  Elevated bilirubin  - Trend  History of gout  - No signs of any gouty exacerbation.  Macrocytosis  - Replete folic acid   DVT prophylaxis: enoxaparin (LOVENOX) injection 40 mg Start: 03/14/24 1000    Code Status: Full Code   Disposition Plan:  Level of care: Telemetry Medical      Consultants:  Neurology (phone)  Subjective: No further fever  Objective: Vitals:   03/17/24 1653 03/17/24 2011 03/17/24 2319 03/18/24 0423  BP: 112/81 125/85 118/69 104/87  Pulse: 91 91 93 81  Resp: 17 19 18 18   Temp: 98.6 F (37 C) 98.2 F (36.8 C) 97.7 F (36.5 C) (!) 97.3 F (36.3 C)  TempSrc: Oral Oral  Oral  SpO2: 96% 93% 95% 95%  Weight:        Intake/Output Summary (Last 24 hours) at 03/18/2024 1335 Last data filed at 03/18/2024 0900 Gross per 24 hour  Intake 100.47 ml  Output 1100 ml  Net -999.53 ml   Filed Weights   03/16/24 1135  Weight: 77.4 kg    Examination:   General: Appearance:     Overweight male in no acute distress   No apparent cellulitis on lower extremity but has lower extremity edema  Lungs:     respirations unlabored  Heart:    Normal heart  rate. .    MS:   All extremities are intact.    Neurologic:   Awake, alert       Data Reviewed: I have personally reviewed following labs and imaging studies  CBC: Recent Labs  Lab 03/13/24 1315 03/13/24 1848 03/14/24 0330 03/15/24 0204 03/17/24 1200  WBC 10.2  --  9.1 10.0 7.9  NEUTROABS 7.4  --   --   --   --   HGB 14.0 14.3 13.1 11.8* 11.6*  HCT 40.5 42.0 39.3 35.2* 34.4*  MCV 106.0*  --  109.2* 107.6* 107.2*  PLT 104*  --  114* 126* 184   Basic Metabolic Panel: Recent Labs  Lab  03/14/24 0039 03/14/24 0330 03/15/24 0204 03/16/24 0221 03/17/24 1200  NA 139 139 136 137 137  K 3.3* 3.6 4.0 3.6 4.0  CL 102 104 102 100 101  CO2 26 24 23 25 25   GLUCOSE 131* 133* 145* 163* 179*  BUN 8 8 10 9 11   CREATININE 0.99 0.98 0.88 0.96 1.01  CALCIUM 6.9* 6.8* 7.0* 7.2* 8.0*  MG  --  1.1* 2.0  --   --    GFR: Estimated Creatinine Clearance: 70.2 mL/min (by C-G formula based on SCr of 1.01 mg/dL). Liver Function Tests: Recent Labs  Lab 03/14/24 0039 03/14/24 0330 03/16/24 0221 03/17/24 1200  AST 28  27 28  42* 43*  ALT 16  16 15 15 17   ALKPHOS 78  77 70 73 71  BILITOT 2.0*  2.0* 2.0* 0.5 0.6  PROT 6.1*  6.3* 5.9* 5.5* 5.6*  ALBUMIN 2.1*  2.3* 2.0* 1.7* 1.6*   Recent Labs  Lab 03/14/24 0039  LIPASE 23   No results for input(s): AMMONIA in the last 168 hours. Coagulation Profile: No results for input(s): INR, PROTIME in the last 168 hours. Cardiac Enzymes: No results for input(s): CKTOTAL, CKMB, CKMBINDEX, TROPONINI in the last 168 hours. BNP (last 3 results) No results for input(s): PROBNP in the last 8760 hours. HbA1C: No results for input(s): HGBA1C in the last 72 hours.  CBG: No results for input(s): GLUCAP in the last 168 hours. Lipid Profile: No results for input(s): CHOL, HDL, LDLCALC, TRIG, CHOLHDL, LDLDIRECT in the last 72 hours. Thyroid  Function Tests: No results for input(s): TSH, T4TOTAL, FREET4, T3FREE, THYROIDAB in the last 72 hours.  Anemia Panel: No results for input(s): VITAMINB12, FOLATE, FERRITIN, TIBC, IRON, RETICCTPCT in the last 72 hours.  Sepsis Labs: No results for input(s): PROCALCITON, LATICACIDVEN in the last 168 hours.  No results found for this or any previous visit (from the past 240 hours).       Radiology Studies: VAS US  LOWER EXTREMITY VENOUS (DVT) Result Date: 03/18/2024  Lower Venous DVT Study Patient Name:  Kenneth Joseph  Date of Exam:    03/17/2024 Medical Rec #: [id number]        Accession #:    [id number] Date of Birth: [birth year]        Patient Gender: M Patient Age:   26 years Exam Location:  [facility] Procedure:      VAS US  LOWER EXTREMITY VENOUS (DVT) Referring Phys: Stina Gane --------------------------------------------------------------------------------  Indications: Worsening lower extremity pain. 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  Comparison Study: No prior study on file Performing Technologist: Alberta Lis RVS  Examination Guidelines: A complete evaluation includes B-mode imaging, spectral Doppler, color Doppler, and power Doppler as needed of all accessible portions of each vessel. Bilateral testing is  considered an integral part of a complete examination. Limited examinations for reoccurring indications may be performed as noted. The reflux portion of the exam is performed with the patient in reverse Trendelenburg.  +---------+---------------+---------+-----------+----------+--------------+ RIGHT    CompressibilityPhasicitySpontaneityPropertiesThrombus Aging +---------+---------------+---------+-----------+----------+--------------+ CFV      Full           Yes      Yes                                 +---------+---------------+---------+-----------+----------+--------------+ SFJ      Full                                                        +---------+---------------+---------+-----------+----------+--------------+ FV Prox  Full           Yes      Yes                                 +---------+---------------+---------+-----------+----------+--------------+ FV Mid   Full                                                        +---------+---------------+---------+-----------+----------+--------------+ FV DistalFull                                                        +---------+---------------+---------+-----------+----------+--------------+  PFV      Full                                                        +---------+---------------+---------+-----------+----------+--------------+ POP      Full           Yes      Yes                                 +---------+---------------+---------+-----------+----------+--------------+ PTV      Full                                                        +---------+---------------+---------+-----------+----------+--------------+ PERO     Full                                                        +---------+---------------+---------+-----------+----------+--------------+   +---------+---------------+---------+-----------+----------+--------------+ LEFT     CompressibilityPhasicitySpontaneityPropertiesThrombus Aging +---------+---------------+---------+-----------+----------+--------------+ CFV      Full  Yes      Yes                                 +---------+---------------+---------+-----------+----------+--------------+ SFJ      Full                                                        +---------+---------------+---------+-----------+----------+--------------+ FV Prox  Full                                                        +---------+---------------+---------+-----------+----------+--------------+ FV Mid   Full                                                        +---------+---------------+---------+-----------+----------+--------------+ FV DistalFull                                                        +---------+---------------+---------+-----------+----------+--------------+ PFV      Full                                                        +---------+---------------+---------+-----------+----------+--------------+ POP      Full           Yes      Yes                                 +---------+---------------+---------+-----------+----------+--------------+ PTV      Full                                                         +---------+---------------+---------+-----------+----------+--------------+ PERO     Full                                                        +---------+---------------+---------+-----------+----------+--------------+     Summary: BILATERAL: - No evidence of deep vein thrombosis seen in the lower extremities, bilaterally. -No evidence of popliteal cyst, bilaterally.   *See table(s) above for measurements and observations. Electronically signed by Lonni Gaskins MD on 03/18/2024 at 1:16:22 PM.    Final    DG CHEST PORT 1 VIEW Result Date: 03/17/2024 CLINICAL DATA:  Fever. EXAM: PORTABLE CHEST 1 VIEW COMPARISON:  03/13/2024  FINDINGS: Lungs are adequately inflated and otherwise clear. Cardiomediastinal silhouette and remainder of the exam is unchanged. IMPRESSION: No active disease. Electronically Signed   By: Toribio Agreste M.D.   On: 03/17/2024 10:46         Scheduled Meds:  atorvastatin  80 mg Oral Daily   enoxaparin (LOVENOX) injection  40 mg Subcutaneous Daily   feeding supplement  237 mL Oral BID BM   folic acid  1 mg Oral Daily   multivitamin with minerals  1 tablet Oral Daily   pregabalin  150 mg Oral BID   thiamine  100 mg Oral Daily   Continuous Infusions:  piperacillin-tazobactam (ZOSYN)  IV 3.375 g (03/18/24 0612)     LOS: 1 day    Time spent: 45 minutes spent on chart review, discussion with nursing staff, consultants, updating family and interview/physical exam; more than 50% of that time was spent in counseling and/or coordination of care.    Harlene RAYMOND Bowl, DO [facility] Available via Epic secure chat 7am-7pm After these hours, please refer to coverage provider listed on [email or web address] 03/18/2024, 1:35 PM

## 2024-03-18 NOTE — NC FL2 (Signed)
 [place]  MEDICAID FL2 LEVEL OF CARE FORM     IDENTIFICATION  Patient Name: Kenneth Joseph Birthdate: [birth year] Sex: male Admission Date (Current Location): 03/13/2024  [facility] and [place] Number:  Producer, television/film/video and Address:  [facility], [address], [place], [place] [address]      Provider Number: [id number]  Attending Physician Name and Address:  Juvenal Harlene PENNER, DO  Relative Name and Phone Number:  [name] [name]/sister: [phone]    Current Level of Care: Hospital Recommended Level of Care: Skilled Nursing Facility Prior Approval Number:    Date Approved/Denied: 03/18/24 PASRR Number: [id number]  Discharge Plan: SNF    Current Diagnoses: Patient Active Problem List   Diagnosis Date Noted   Colitis 03/14/2024   Alcohol abuse 03/14/2024   Elevated ferritin 02/19/2024   Low vitamin D level 02/19/2024   Abnormal level of blood mineral 02/19/2024   Current drinker of alcohol 02/19/2024   Diverticulosis of colon 02/19/2024   Gout 02/19/2024   Hardening of the aorta (main artery of the heart) 02/19/2024   Hypercholesterolemia 02/19/2024   Hypertension 02/19/2024   Hypocalcemia 02/19/2024   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   Left lower quadrant abdominal pain 02/19/2024   Loss of appetite 02/19/2024   Macrocytosis 02/19/2024   Peripheral polyneuropathy 02/19/2024   Diabetes (HCC) 02/19/2024   Type 2 diabetes mellitus with other specified complication (HCC) 02/19/2024   Type 2 diabetes mellitus without complications (HCC) 02/19/2024   Vitamin B12 deficiency 02/19/2024    Orientation RESPIRATION BLADDER Height & Weight     Self, Situation, Time, Place  Normal Incontinent Weight: 170 lb 10.2 oz (77.4 kg) Height:     BEHAVIORAL SYMPTOMS/MOOD NEUROLOGICAL BOWEL NUTRITION STATUS      Continent Diet (please refer to DC summary)  AMBULATORY STATUS COMMUNICATION OF NEEDS Skin   Limited Assist Verbally Normal                        Personal Care Assistance Level of Assistance  Bathing, Feeding, Dressing Bathing Assistance: Limited assistance Feeding assistance: Independent Dressing Assistance: Limited assistance     Functional Limitations Info  Sight, Hearing, Speech Sight Info: Adequate (Eyeglasses) Hearing Info: Adequate Speech Info: Adequate    SPECIAL CARE FACTORS FREQUENCY  PT (By licensed PT), OT (By licensed OT)     PT Frequency: 5x/week OT Frequency: 5x/week            Contractures Contractures Info: Not present    Additional Factors Info  Code Status, Allergies Code Status Info: Full Allergies Info: Colchicine           Current Medications (03/18/2024):  This is the current hospital active medication list Current Facility-Administered Medications  Medication Dose Route Frequency Provider Last Rate Last Admin   acetaminophen  (TYLENOL ) tablet 650 mg  650 mg Oral Q6H PRN Franky Redia SAILOR, MD       Or   acetaminophen  (TYLENOL ) suppository 650 mg  650 mg Rectal Q6H PRN Franky Redia SAILOR, MD       atorvastatin (LIPITOR) tablet 80 mg  80 mg Oral Daily Kakrakandy, Arshad N, MD   80 mg at 03/18/24 1017   enoxaparin (LOVENOX) injection 40 mg  40 mg Subcutaneous Daily Franky Redia SAILOR, MD   40 mg at 03/18/24 1017   feeding supplement (ENSURE PLUS HIGH PROTEIN) liquid 237 mL  237 mL Oral BID BM Vann, Jessica U, DO   237 mL at  03/17/24 1458   folic acid (FOLVITE) tablet 1 mg  1 mg Oral Daily Kakrakandy, Arshad N, MD   1 mg at 03/18/24 1017   HYDROcodone -acetaminophen  (NORCO/VICODIN) 5-325 MG per tablet 1 tablet  1 tablet Oral Q6H PRN Vann, Jessica U, DO   1 tablet at 03/18/24 1017   multivitamin with minerals tablet 1 tablet  1 tablet Oral Daily Franky Redia SAILOR, MD   1 tablet at 03/18/24 1017   piperacillin-tazobactam (ZOSYN) IVPB 3.375 g  3.375 g Intravenous Q8H Baruch Heather HERO, RPH 12.5 mL/hr at 03/18/24 0612 3.375 g at 03/18/24 0612   pregabalin (LYRICA)  capsule 150 mg  150 mg Oral BID Vann, Jessica U, DO   150 mg at 03/18/24 1017   thiamine (VITAMIN B1) tablet 100 mg  100 mg Oral Daily Franky Redia SAILOR, MD   100 mg at 03/18/24 1017     Discharge Medications: Please see discharge summary for a list of discharge medications.  Relevant Imaging Results:  Relevant Lab Results:   Additional Information Pt's SSN: [id number]  Sherline Clack, [place]

## 2024-03-19 DIAGNOSIS — K529 Noninfective gastroenteritis and colitis, unspecified: Secondary | ICD-10-CM | POA: Diagnosis not present

## 2024-03-19 LAB — CBC
HCT: 31.2 % — ABNORMAL LOW (ref 39.0–52.0)
Hemoglobin: 10.7 g/dL — ABNORMAL LOW (ref 13.0–17.0)
MCH: 36.1 pg — ABNORMAL HIGH (ref 26.0–34.0)
MCHC: 34.3 g/dL (ref 30.0–36.0)
MCV: 105.4 fL — ABNORMAL HIGH (ref 80.0–100.0)
Platelets: 217 K/uL (ref 150–400)
RBC: 2.96 MIL/uL — ABNORMAL LOW (ref 4.22–5.81)
RDW: 15 % (ref 11.5–15.5)
WBC: 6.9 K/uL (ref 4.0–10.5)
nRBC: 0 % (ref 0.0–0.2)

## 2024-03-19 LAB — BASIC METABOLIC PANEL WITH GFR
Anion gap: 8 (ref 5–15)
BUN: 14 mg/dL (ref 8–23)
CO2: 28 mmol/L (ref 22–32)
Calcium: 8.5 mg/dL — ABNORMAL LOW (ref 8.9–10.3)
Chloride: 99 mmol/L (ref 98–111)
Creatinine, Ser: 1.08 mg/dL (ref 0.61–1.24)
GFR, Estimated: >60 mL/min (ref >60)
Glucose, Bld: 203 mg/dL — ABNORMAL HIGH (ref 70–99)
Potassium: 4.4 mmol/L (ref 3.5–5.1)
Sodium: 135 mmol/L (ref 135–145)

## 2024-03-19 NOTE — Progress Notes (Signed)
 PROGRESS NOTE    Kenneth Joseph  FMW:[id number] DOB: 1956/05/04 DOA: 03/13/2024 PCP: Kenneth Pellet, MD    Brief Narrative:  Kenneth Joseph is a 68 y.o. male with history of diabetes mellitus type 2, hypertension, hyperlipidemia, B12 deficiency presents to the ER because of worsening pain in the lower extremities.  Patient had been having some tingling and numbness for last few months and had followed up with neurologist in June 2025 for dizziness when neurologist did diagnosed with with the likely from alcohol induced and had offered nerve studies but at that time per the notes patient had declined comes to the ER because of worsening pain in the bilateral lower extremity which has acutely worsened last 4 days.  Patient is finding it difficult to ambulate because of the pain.  Patient had recently traveled to [place]  last week and after returning over the last 4 days patient also has been having watery diarrhea which resolved soon as he was admitted   Assessment and Plan: Incidental finding of colitis -     cause not clear was transiently hypotensive could be related to dehydration of the morphine which patient received. 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  At that time patient had declined any nerve studies.  Dr. Franky discussed with on-call neurologist Dr. Voncile who felt patient's neuropathy could be from alcohol.  Will need outpatient nerve studies, okay to start Lyrica-adjust dose for better control -Replete folate  Hypertension - Blood pressure on lower side so we will hold medications  Diabetes mellitus type 2 takes metformin and glipizide presently on sliding scale coverage.   Hemoglobin A1c today is 5.5.  B12 deficiency  -takes B12 shots every month.  Alcohol abuse has not had any alcohol last 3 days because patient was finding it difficult to ambulate because of the pain. - CIWA  Hyperlipidemia takes statins.  Elevated bilirubin  - Trend  History of gout  - No signs of any gouty exacerbation.  Macrocytosis  - Replete folic acid   DVT prophylaxis: enoxaparin (LOVENOX) injection 40 mg Start: 03/14/24 1000    Code Status: Full Code   Disposition Plan:  Level of care: Telemetry Medical Status is: Observation     Consultants:  Neurology (phone)  Subjective: Pain improved but still worsens from rest to standing up  Objective: Vitals:   03/19/24 0005 03/19/24 0446 03/19/24 0747 03/19/24 1107  BP: 103/71 105/71 113/76 122/77  Pulse: 92 83 89 89  Resp: 18 18 18 18   Temp: 98.6 F (37 C) 98.6 F (37 C) 98.6 F (37 C) 98.1 F (36.7 C)  TempSrc: Oral Oral    SpO2: 95% 93% 92% 95%  Weight:        Intake/Output Summary (Last 24 hours) at 03/19/2024 1305 Last data filed at 03/18/2024 1730 Gross per 24 hour  Intake --  Output 225 ml  Net -225 ml   Filed Weights   03/16/24 1135  Weight: 77.4 kg    Examination:   General: Appearance:     Overweight male in no acute distress   No apparent cellulitis on lower extremity  Lungs:     respirations unlabored  Heart:    Normal heart rate. .    MS:  All extremities are intact.    Neurologic:   Awake, alert       Data Reviewed: I have personally reviewed following labs and imaging studies  CBC: Recent Labs  Lab 03/13/24 1315 03/13/24 1848 03/14/24 0330 03/15/24 0204 03/17/24 1200 03/19/24 0428  WBC 10.2  --  9.1 10.0 7.9 6.9  NEUTROABS 7.4  --   --   --   --   --   HGB 14.0 14.3 13.1 11.8* 11.6* 10.7*  HCT 40.5 42.0 39.3 35.2* 34.4* 31.2*  MCV 106.0*  --  109.2* 107.6* 107.2* 105.4*  PLT 104*  --  114* 126* 184 217   Basic Metabolic Panel: Recent Labs  Lab  03/14/24 0330 03/15/24 0204 03/16/24 0221 03/17/24 1200 03/19/24 0428  NA 139 136 137 137 135  K 3.6 4.0 3.6 4.0 4.4  CL 104 102 100 101 99  CO2 24 23 25 25 28   GLUCOSE 133* 145* 163* 179* 203*  BUN 8 10 9 11 14   CREATININE 0.98 0.88 0.96 1.01 1.08  CALCIUM 6.8* 7.0* 7.2* 8.0* 8.5*  MG 1.1* 2.0  --   --   --    GFR: Estimated Creatinine Clearance: 65.6 mL/min (by C-G formula based on SCr of 1.08 mg/dL). Liver Function Tests: Recent Labs  Lab 03/14/24 0039 03/14/24 0330 03/16/24 0221 03/17/24 1200  AST 28  27 28  42* 43*  ALT 16  16 15 15 17   ALKPHOS 78  77 70 73 71  BILITOT 2.0*  2.0* 2.0* 0.5 0.6  PROT 6.1*  6.3* 5.9* 5.5* 5.6*  ALBUMIN 2.1*  2.3* 2.0* 1.7* 1.6*   Recent Labs  Lab 03/14/24 0039  LIPASE 23   No results for input(s): AMMONIA in the last 168 hours. Coagulation Profile: No results for input(s): INR, PROTIME in the last 168 hours. Cardiac Enzymes: No results for input(s): CKTOTAL, CKMB, CKMBINDEX, TROPONINI in the last 168 hours. BNP (last 3 results) No results for input(s): PROBNP in the last 8760 hours. HbA1C: No results for input(s): HGBA1C in the last 72 hours.  CBG: No results for input(s): GLUCAP in the last 168 hours. Lipid Profile: No results for input(s): CHOL, HDL, LDLCALC, TRIG, CHOLHDL, LDLDIRECT in the last 72 hours. Thyroid  Function Tests: No results for input(s): TSH, T4TOTAL, FREET4, T3FREE, THYROIDAB in the last 72 hours.  Anemia Panel: No results for input(s): VITAMINB12, FOLATE, FERRITIN, TIBC, IRON, RETICCTPCT in the last 72 hours.  Sepsis Labs: No results for input(s): PROCALCITON, LATICACIDVEN in the last 168 hours.  No results found for this or any previous visit (from the past 240 hours).       Radiology Studies: VAS US  LOWER EXTREMITY VENOUS (DVT) Result Date: 03/18/2024  Lower Venous DVT Study Patient Name:  Kenneth Joseph  Date of Exam:    03/17/2024 Medical Rec #: [id number]        Accession #:    [id number] Date of Birth: [birth year]        Patient Gender: M Patient Age:   79 years Exam Location:  [facility] Procedure:      VAS US  LOWER EXTREMITY VENOUS (DVT) Referring Phys: Kenneth Joseph --------------------------------------------------------------------------------  Indications: Worsening lower extremity pain. 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  Comparison Study: No prior study on file Performing Technologist: Alberta Lis RVS  Examination Guidelines: A complete evaluation includes B-mode imaging, spectral Doppler, color Doppler, and power Doppler as needed of all accessible portions of each vessel. Bilateral  testing is considered an integral part of a complete examination. Limited examinations for reoccurring indications may be performed as noted. The reflux portion of the exam is performed with the patient in reverse Trendelenburg.  +---------+---------------+---------+-----------+----------+--------------+ RIGHT    CompressibilityPhasicitySpontaneityPropertiesThrombus Aging +---------+---------------+---------+-----------+----------+--------------+ CFV      Full           Yes      Yes                                 +---------+---------------+---------+-----------+----------+--------------+ SFJ      Full                                                        +---------+---------------+---------+-----------+----------+--------------+ FV Prox  Full           Yes      Yes                                 +---------+---------------+---------+-----------+----------+--------------+ FV Mid   Full                                                        +---------+---------------+---------+-----------+----------+--------------+ FV DistalFull                                                        +---------+---------------+---------+-----------+----------+--------------+  PFV      Full                                                        +---------+---------------+---------+-----------+----------+--------------+ POP      Full           Yes      Yes                                 +---------+---------------+---------+-----------+----------+--------------+ PTV      Full                                                        +---------+---------------+---------+-----------+----------+--------------+ PERO     Full                                                        +---------+---------------+---------+-----------+----------+--------------+   +---------+---------------+---------+-----------+----------+--------------+ LEFT     CompressibilityPhasicitySpontaneityPropertiesThrombus Aging +---------+---------------+---------+-----------+----------+--------------+ CFV      Full  Yes      Yes                                 +---------+---------------+---------+-----------+----------+--------------+ SFJ      Full                                                        +---------+---------------+---------+-----------+----------+--------------+ FV Prox  Full                                                        +---------+---------------+---------+-----------+----------+--------------+ FV Mid   Full                                                        +---------+---------------+---------+-----------+----------+--------------+ FV DistalFull                                                        +---------+---------------+---------+-----------+----------+--------------+ PFV      Full                                                        +---------+---------------+---------+-----------+----------+--------------+ POP      Full           Yes      Yes                                 +---------+---------------+---------+-----------+----------+--------------+ PTV      Full                                                         +---------+---------------+---------+-----------+----------+--------------+ PERO     Full                                                        +---------+---------------+---------+-----------+----------+--------------+     Summary: BILATERAL: - No evidence of deep vein thrombosis seen in the lower extremities, bilaterally. -No evidence of popliteal cyst, bilaterally.   *See table(s) above for measurements and observations. Electronically signed by Lonni Gaskins MD on 03/18/2024 at 1:16:22 PM.    Final          Scheduled Meds:  atorvastatin  80 mg Oral Daily   enoxaparin (LOVENOX) injection  40 mg Subcutaneous Daily   feeding supplement  237 mL Oral BID BM   folic acid  1 mg Oral Daily   multivitamin with minerals  1 tablet Oral Daily   pregabalin  150 mg Oral BID   thiamine  100 mg Oral Daily   Continuous Infusions:  piperacillin-tazobactam (ZOSYN)  IV 3.375 g (03/19/24 0515)     LOS: 2 days    Time spent: 45 minutes spent on chart review, discussion with nursing staff, consultants, updating family and interview/physical exam; more than 50% of that time was spent in counseling and/or coordination of care.    Harlene RAYMOND Bowl, DO [facility] Available via Epic secure chat 7am-7pm After these hours, please refer to coverage provider listed on [email or web address] 03/19/2024, 1:05 PM

## 2024-03-19 NOTE — Plan of Care (Signed)
 Pt Slept most of the shift. Was caught turning off beeping alarm from IV pump. I educated on importance of notifying staff when alarm goes off and not just silencing the alarm, as he would not continue to get the medication that he needed. IV pump lock then initiated.   Problem: Education: Goal: Knowledge of General Education information will improve Description: Including pain rating scale, medication(s)/side effects and non-pharmacologic comfort measures Outcome: Progressing   Problem: Health Behavior/Discharge Planning: Goal: Ability to manage health-related needs will improve Outcome: Progressing   Problem: Clinical Measurements: Goal: Ability to maintain clinical measurements within normal limits will improve Outcome: Progressing Goal: Will remain free from infection Outcome: Progressing Goal: Diagnostic test results will improve Outcome: Progressing Goal: Respiratory complications will improve Outcome: Progressing Goal: Cardiovascular complication will be avoided Outcome: Progressing   Problem: Activity: Goal: Risk for activity intolerance will decrease Outcome: Progressing   Problem: Nutrition: Goal: Adequate nutrition will be maintained Outcome: Progressing   Problem: Coping: Goal: Level of anxiety will decrease Outcome: Progressing   Problem: Elimination: Goal: Will not experience complications related to bowel motility Outcome: Progressing Goal: Will not experience complications related to urinary retention Outcome: Progressing   Problem: Pain Managment: Goal: General experience of comfort will improve and/or be controlled Outcome: Progressing   Problem: Safety: Goal: Ability to remain free from injury will improve Outcome: Progressing   Problem: Skin Integrity: Goal: Risk for impaired skin integrity will decrease Outcome: Progressing

## 2024-03-19 NOTE — Progress Notes (Signed)
 Occupational Therapy Treatment Patient Details Name: Kenneth Joseph MRN: [id number] DOB: November 30, 1955 Today's Date: 03/19/2024   History of present illness Pt is a 68 y/o male presenting 10/15 to the [facility] ER with worsening pain in the LEs. Subsequently pt has been having watery stools. Work up for colitis and intervention for acute neuropathic pain.  PMH: ETOH use, DM, Hep C, HTN   OT comments  OT session focused on training in techniques for increased safety and independence with ADLs and bed mobility and functional transfers/mobility during/in preparation for ADLs. Pt currently demonstrates ability to complete ADLs largely with Set up to Min assist, bed mobility with Mod I, and functional transfers/mobility with a RW with Contact guard assist. Pt continues to be limited by B LE pain, but participated well in session and is making progress toward OT goals. Pt VSS on RA. Acute OT to continue to follow. Post acute discharge, pt will benefit from intensive inpatient skilled rehab services < 3 hours per day to maximize rehab potential, decrease risk of falls, and decrease risk of rehospitalization.       If plan is discharge home, recommend the following:  A little help with walking and/or transfers;A little help with bathing/dressing/bathroom;Assistance with cooking/housework;Assist for transportation;Direct supervision/assist for medications management;Direct supervision/assist for financial management;Help with stairs or ramp for entrance   Equipment Recommendations  BSC/3in1;Wheelchair (measurements OT);Wheelchair cushion (measurements OT)    Recommendations for Other Services      Precautions / Restrictions Precautions Precautions: Fall Recall of Precautions/Restrictions: Intact Restrictions Weight Bearing Restrictions Per Provider Order: No       Mobility Bed Mobility Overal bed mobility: Modified Independent Bed Mobility: Supine to Sit, Sit to Supine     Supine to sit: Modified  independent (Device/Increase time), HOB elevated, Used rails Sit to supine: Modified independent (Device/Increase time), HOB elevated, Used rails   General bed mobility comments: increased time; poor overall sequencing, but accomplished task without assist    Transfers Overall transfer level: Needs assistance Equipment used: Rolling walker (2 wheels) Transfers: Sit to/from Stand, Bed to chair/wheelchair/BSC Sit to Stand: Min assist     Step pivot transfers: Contact guard assist     General transfer comment: good initiation; cues for hand placement     Balance Overall balance assessment: Needs assistance Sitting-balance support: No upper extremity supported, Feet supported, Single extremity supported Sitting balance-Leahy Scale: Fair Sitting balance - Comments: benefits from UE support due to posterior lean; able to correct posterior lean with verbal cues and increased effort Postural control: Posterior lean Standing balance support: Single extremity supported, Bilateral upper extremity supported, During functional activity, Reliant on assistive device for balance, No upper extremity supported Standing balance-Leahy Scale: Poor Standing balance comment: Pt able to perform grooming tasks standing at sink, but requiring UE support or leaning against counter with CGA of therapist throughout for balance                           ADL either performed or assessed with clinical judgement   ADL Overall ADL's : Needs assistance/impaired Eating/Feeding: Set up;Sitting   Grooming: Contact guard assist;Standing           Upper Body Dressing : Contact guard assist;Sitting   Lower Body Dressing: Contact guard assist;Sitting/lateral leans;Sit to/from stand   Toilet Transfer: Contact guard assist;Minimal assistance;Ambulation;Comfort height toilet;Rolling walker (2 wheels) Toilet Transfer Details (indicate cue type and reason): Min assist to power up; CGA once in  standing Toileting-  Clothing Manipulation and Hygiene: Minimal assistance;Sit to/from stand Toileting - Clothing Manipulation Details (indicate cue type and reason): simulated peri care     Functional mobility during ADLs: Contact guard assist;Rolling walker (2 wheels) (from bed <-> bathroom) General ADL Comments: Pt with decreased activity tolerance and pain affecting funcitonal level    Extremity/Trunk Assessment Upper Extremity Assessment Upper Extremity Assessment: Right hand dominant;Generalized weakness;RUE deficits/detail;LUE deficits/detail RUE Deficits / Details: generalized weakness; impaired sensation 2/2 hx of neuropathy; decreased fine motor coordination RUE Coordination: decreased fine motor LUE Deficits / Details: generalized weakness; impaired sensation 2/2 hx of neuropathy; decreased fine motor coordination LUE Coordination: decreased fine motor   Lower Extremity Assessment Lower Extremity Assessment: Defer to PT evaluation        Vision   Vision Assessment?: No apparent visual deficits;Wears glasses for reading Additional Comments: wears readers   Perception     Praxis     Communication Communication Communication: No apparent difficulties   Cognition Arousal: Alert Behavior During Therapy: WFL for tasks assessed/performed Cognition: Cognition impaired, No family/caregiver present to determine baseline     Awareness: Intellectual awareness intact, Online awareness intact (intermittent online awareness)   Attention impairment (select first level of impairment): Selective attention (easily internally distracted) Executive functioning impairment (select all impairments): Reasoning, Problem solving OT - Cognition Comments: Pt AAOx4 and pleasant throughout session. Pt easily internally distracted and tangential in conversation requiring occasional redirection this session to attend to tasks.                 Following commands: Intact        Cueing    Cueing Techniques: Verbal cues  Exercises      Shoulder Instructions       General Comments VSS on RA    Pertinent Vitals/ Pain       Pain Assessment Pain Assessment: 0-10 Pain Score: 9  (6-7 at rest; 9 in standing/ambulating) Pain Location: bil feet Pain Descriptors / Indicators: Sharp, Guarding, Grimacing, Discomfort Pain Intervention(s): Limited activity within patient's tolerance, Monitored during session, Repositioned  Home Living                                          Prior Functioning/Environment              Frequency  Min 2X/week        Progress Toward Goals  OT Goals(current goals can now be found in the care plan section)  Progress towards OT goals: Progressing toward goals  Acute Rehab OT Goals Patient Stated Goal: to have less pain in his feet  Plan      Co-evaluation                 AM-PAC OT 6 Clicks Daily Activity     Outcome Measure   Help from another person eating meals?: A Little Help from another person taking care of personal grooming?: A Little Help from another person toileting, which includes using toliet, bedpan, or urinal?: A Little Help from another person bathing (including washing, rinsing, drying)?: A Little Help from another person to put on and taking off regular upper body clothing?: A Little Help from another person to put on and taking off regular lower body clothing?: A Little 6 Click Score: 18    End of Session Equipment Utilized During Treatment: Gait belt;Rolling walker (2 wheels)  OT Visit Diagnosis: Unsteadiness  on feet (R26.81);Other abnormalities of gait and mobility (R26.89);Muscle weakness (generalized) (M62.81);History of falling (Z91.81);Ataxia, unspecified (R27.0);Other symptoms and signs involving cognitive function;Pain   Activity Tolerance Patient tolerated treatment well;Patient limited by pain   Patient Left in bed;with call bell/phone within reach;with bed alarm set    Nurse Communication Mobility status        Time: 8850-8791 OT Time Calculation (min): 19 min  Charges: OT General Charges $OT Visit: 1 Visit OT Treatments $Self Care/Home Management : 8-22 mins  Margarie Rockey HERO., OTR/L, MA Acute Rehab [phone]   Margarie FORBES Horns 03/19/2024, 5:31 PM

## 2024-03-19 NOTE — Plan of Care (Signed)

## 2024-03-19 NOTE — Progress Notes (Signed)
 Mobility Specialist: Progress Note   03/19/24 1600  Mobility  Activity Ambulated with assistance  Level of Assistance Contact guard assist, steadying assist  Assistive Device Front wheel walker  Distance Ambulated (ft) 25 ft  Activity Response Tolerated well  Mobility Referral Yes  Mobility visit 1 Mobility  Mobility Specialist Start Time (ACUTE ONLY) 0950  Mobility Specialist Stop Time (ACUTE ONLY) 1002  Mobility Specialist Time Calculation (min) (ACUTE ONLY) 12 min    Pt received in bed, agreeable to mobility session. In a lot of pain but very motivated to progress with mobility. C/o pain in bil bottom feet and inner thighs soon as he woke up this morning as well as during mobility. SV for bed mobility. MinA for STS. CGA for ambulation to the door and back to bed. Returned to supine. Left in bed with all needs met, call bell in reach. Bed alarm on.   Ileana Lute Mobility Specialist Please contact via SecureChat or Rehab office at [phone]

## 2024-03-20 ENCOUNTER — Other Ambulatory Visit: Payer: Self-pay | Admitting: Licensed Clinical Social Worker

## 2024-03-20 DIAGNOSIS — K529 Noninfective gastroenteritis and colitis, unspecified: Secondary | ICD-10-CM | POA: Diagnosis not present

## 2024-03-20 MED ORDER — POLYETHYLENE GLYCOL 3350 17 G PO PACK
17.0000 g | PACK | Freq: Every day | ORAL | Status: DC
  Administered 2024-03-20 – 2024-03-21 (×2): 17 g via ORAL
  Filled 2024-03-20 (×2): qty 1

## 2024-03-20 MED ORDER — DOCUSATE SODIUM 100 MG PO CAPS
100.0000 mg | ORAL_CAPSULE | Freq: Two times a day (BID) | ORAL | Status: DC | PRN
  Administered 2024-03-20: 100 mg via ORAL
  Filled 2024-03-20: qty 1

## 2024-03-20 NOTE — Plan of Care (Signed)

## 2024-03-20 NOTE — Care Management Important Message (Signed)
 Important Message  Patient Details  Name: Kenneth Joseph MRN: [id number] Date of Birth: [birth year]   Important Message Given:  Yes - Medicare IM     Claretta Deed 03/20/2024, 2:40 PM

## 2024-03-20 NOTE — TOC Progression Note (Addendum)
 Transition of Care [facility]) - Progression Note    Patient Details  Name: Kenneth Joseph MRN: [id number] Date of Birth: 1955-09-10  Transition of Care [facility]) CM/SW Contact  Sherline Clack, [place] Phone Number: 03/20/2024, 10:31 AM  Clinical Narrative:     Update 3:03 PM: [place] [phone], currently pending  Update 12:22 PM: Social work Event organiser reached out to [employer] for assistance with [employer] authorization through [place].   CSW spoke with patient regarding SNF choice. CSW presented facility list with Medicaid ratings, and patient chose [facility]. CSW will reach out to the facility and submit insurance authorization. CSW will continue to follow.   Expected Discharge Plan: Skilled Nursing Facility Barriers to Discharge: SNF Pending bed offer               Expected Discharge Plan and Services       Living arrangements for the past 2 months: Single Family Home                                       Social Drivers of Health (SDOH) Interventions SDOH Screenings   Food Insecurity: No Food Insecurity (03/14/2024)  Housing: Low Risk  (03/14/2024)  Transportation Needs: No Transportation Needs (03/14/2024)  Utilities: Not At Risk (03/14/2024)  Depression (PHQ2-9): Low Risk  (02/19/2024)  Social Connections: Unknown (03/14/2024)  Tobacco Use: Low Risk  (03/14/2024)    Readmission Risk Interventions     No data to display

## 2024-03-20 NOTE — Progress Notes (Signed)
 Physical Therapy Treatment Patient Details Name: Kenneth Joseph MRN: [id number] DOB: 1955-12-13 Today's Date: 03/20/2024   History of Present Illness Pt is a 68 y/o male presenting 10/15 to the ER with worsening pain in the LE's.  Subsequently pt has been having watery stools.  Work up for colitis and intervention for acute neuropathic pain.  ETOH use, DM, Hep C, HTN,    PT Comments  Pt supine in bed on entry, reports he does not have any pain laying in bed but thinks it will be 10/10 with walking. PT instructed pt to not focus on the pain but on progressing his mobility. Pt is contact guard for bed mobility, with coming to [facility] pt noted to have a BM. Pt stands with minA for pericare before walking. Pt walks with minA to heavy min A with distance, reporting his pain today is not neuropathic but in his ankles. Instructed in gastrocs stretch. D/c plan remains appropriate at this time. PT will continue to follow acutely.     If plan is discharge home, recommend the following: A lot of help with bathing/dressing/bathroom;A lot of help with walking and/or transfers;Assistance with cooking/housework;Assist for transportation;Help with stairs or ramp for entrance   Can travel by private vehicle     No  Equipment Recommendations  BSC/3in1;Wheelchair (measurements PT);Wheelchair cushion (measurements PT)       Precautions / Restrictions Precautions Precautions: Fall Recall of Precautions/Restrictions: Intact Restrictions Weight Bearing Restrictions Per Provider Order: No     Mobility  Bed Mobility Overal bed mobility: Modified Independent Bed Mobility: Supine to Sit, Sit to Supine     Supine to sit: Modified independent (Device/Increase time) Sit to supine: Modified independent (Device/Increase time)   General bed mobility comments: not very efficient sequencing, but able to complete without assist    Transfers Overall transfer level: Needs assistance Equipment used: Rolling walker  (2 wheels) Transfers: Sit to/from Stand, Bed to chair/wheelchair/BSC Sit to Stand: Min assist           General transfer comment: good power up, requires min A for steadying once up in the RW from both the bed and from straight back chair with armrests, cues for both hands to RW grips before initiating gait    Ambulation/Gait Ambulation/Gait assistance: Min assist Gait Distance (Feet): 25 Feet Assistive device: Rolling walker (2 wheels) Gait Pattern/deviations: Step-through pattern, Decreased step length - right, Decreased step length - left, Shuffle, Trunk flexed, Wide base of support Gait velocity: slowed Gait velocity interpretation: <1.31 ft/sec, indicative of household ambulator   General Gait Details: overall min A for steadying needs increasing assist with distance and ankle pain      Balance Overall balance assessment: Needs assistance Sitting-balance support: Bilateral upper extremity supported, No upper extremity supported, Feet supported Sitting balance-Leahy Scale: Fair Sitting balance - Comments: benefits from UE support due to posterior lean Postural control: Posterior lean Standing balance support: Bilateral upper extremity supported, Reliant on assistive device for balance, During functional activity Standing balance-Leahy Scale: Poor Standing balance comment: reliant on RW /external support due to pain/neuropathy                            Communication Communication Communication: No apparent difficulties  Cognition Arousal: Alert Behavior During Therapy: WFL for tasks assessed/performed   PT - Cognitive impairments: Safety/Judgement  Following commands: Intact      Cueing Cueing Techniques: Verbal cues     General Comments General comments (skin integrity, edema, etc.): VSS on RA, unaware of BM requires standing pericare and linen change before return to bed      Pertinent Vitals/Pain Pain  Assessment Pain Assessment: Faces Faces Pain Scale: Hurts even more Pain Location: bilateral ankles Pain Descriptors / Indicators: Sore, Tightness Pain Intervention(s): Limited activity within patient's tolerance, Monitored during session, Repositioned     PT Goals (current goals can now be found in the care plan section) Acute Rehab PT Goals Patient Stated Goal: lessen pain.  Figure out my options. PT Goal Formulation: With patient Time For Goal Achievement: 03/28/24 Potential to Achieve Goals: Fair Progress towards PT goals: Progressing toward goals    Frequency    Min 3X/week       AM-PAC PT 6 Clicks Mobility   Outcome Measure  Help needed turning from your back to your side while in a flat bed without using bedrails?: None Help needed moving from lying on your back to sitting on the side of a flat bed without using bedrails?: None Help needed moving to and from a bed to a chair (including a wheelchair)?: A Little Help needed standing up from a chair using your arms (e.g., wheelchair or bedside chair)?: A Little Help needed to walk in hospital room?: A Little Help needed climbing 3-5 steps with a railing? : Total 6 Click Score: 18    End of Session Equipment Utilized During Treatment: Gait belt Activity Tolerance: Patient limited by pain Patient left: in bed;with call bell/phone within reach;with bed alarm set Nurse Communication: Mobility status PT Visit Diagnosis: Unsteadiness on feet (R26.81);Other abnormalities of gait and mobility (R26.89);Muscle weakness (generalized) (M62.81);Difficulty in walking, not elsewhere classified (R26.2);Pain Pain - Right/Left:  (both) Pain - part of body: Ankle and joints of foot     Time: 1043-1100 PT Time Calculation (min) (ACUTE ONLY): 17 min  Charges:    $Gait Training: 8-22 mins PT General Charges $$ ACUTE PT VISIT: 1 Visit                     Telsa Dillavou B. Fleeta Lapidus PT, DPT Acute Rehabilitation Services Please use  secure chat or  Call Office [phone]    Almarie KATHEE Fleeta Fleet 03/20/2024, 11:10 AM

## 2024-03-20 NOTE — Progress Notes (Signed)
 PROGRESS NOTE    Kenneth Joseph  FMW:[id number] DOB: 06-Nov-1955 DOA: 03/13/2024 PCP: Claudene Pellet, MD   Brief Narrative:   68 y.o. male with history of diabetes mellitus type 2, hypertension, hyperlipidemia, B12 deficiency presents to the ER because of worsening pain in the lower extremities.  Patient had been having some tingling and numbness for last few months and had followed up with neurologist in June 2025 for dizziness when neurologist did diagnosed with with the likely from alcohol induced and had offered nerve studies but at that time per the notes patient had declined comes to the ER because of worsening pain in the bilateral lower extremity which has acutely worsened last 4 days.  Patient is finding it difficult to ambulate because of the pain.  Patient had recently traveled to [place]  last week and after returning over the last 4 days patient also has been having watery diarrhea which resolved soon as he was admitted. He is medically stable.  Currently, he is awaiting skilled nursing facility placement.  Assessment & Plan:  Principal Problem:   Colitis Active Problems:   Gout   Hypercholesterolemia   Hypertension   Macrocytosis   Peripheral polyneuropathy   Type 2 diabetes mellitus with other specified complication (HCC)   Type 2 diabetes mellitus without complications (HCC)   Vitamin B12 deficiency   Alcohol abuse   Acute possible bacterial colitis -  - Received intravenous seen.  Discontinued on 03/20/2024. -Patient is afebrile, and there is no evidence of leukocytosis      Bilateral lower extremity pain likely from neuropathy: Per neurology notes in June 2025 patient likely had neuropathy from alcohol abuse.  At that time patient had declined any nerve studies.  Dr. Franky discussed with on-call neurologist Dr. Voncile who felt patient's neuropathy could be from alcohol.  Will need outpatient nerve studies, okay to start Lyrica-adjust dose for better control     Hypertension - Blood pressure on lower side so we will hold medications   Diabetes mellitus type 2: Takes metformin and glipizide at home, presently on sliding scale coverage.  Hemoglobin A1c is 5.5.   B12 deficiency  -takes B12 shots every month.   Alcohol abuse: has not had any alcohol last 3 days because patient was finding it difficult to ambulate because of the pain. - CIWA   Hyperlipidemia: Continue with statin   Elevated bilirubin  - No acute issues   History of gout  - No signs of any gouty exacerbation.   Macrocytosis  - Replete folic acid  Disposition: Pending placement to skilled nursing facility   DVT prophylaxis: enoxaparin (LOVENOX) injection 40 mg Start: 03/14/24 1000     Code Status: Full Code Family Communication: None at the bedside Status is: Inpatient Remains inpatient appropriate because: Pending placement    Subjective:  No acute events overnight.  Resting comfortably in the bed.  We spoke about his treatment for colitis and he is currently on IV Zosyn.  We talked about transitioning to oral antibiotics.  He is currently pending skilled nursing facility placement.  Examination:  General exam: Appears calm and comfortable  Respiratory system: Clear to auscultation. Respiratory effort normal. Cardiovascular system: S1 & S2 heard, RRR. No JVD, murmurs, rubs, gallops or clicks. No pedal edema. Gastrointestinal system: Abdomen is nondistended, soft and nontender. No organomegaly or masses felt. Normal bowel sounds heard. Central nervous system: Alert and oriented. No focal neurological deficits. Extremities: Symmetric 5 x 5 power. Skin: No rashes, lesions or ulcers Psychiatry:  Judgement and insight appear normal. Mood & affect appropriate.       Diet Orders (From admission, onward)     Start     Ordered   03/14/24 1238  Diet Carb Modified Fluid consistency: Thin; Room service appropriate? Yes  Diet effective now       Question Answer  Comment  Diet-HS Snack? Nothing   Calorie Level Medium 1600-2000   Fluid consistency: Thin   Room service appropriate? Yes      03/14/24 1237            Objective: Vitals:   03/19/24 1107 03/19/24 1523 03/20/24 0633 03/20/24 0755  BP: 122/77 101/67 108/76 115/77  Pulse: 89 89 90 (!) 102  Resp: 18 20 18    Temp: 98.1 F (36.7 C) (!) 97.1 F (36.2 C) 98.6 F (37 C) 98.4 F (36.9 C)  TempSrc:      SpO2: 95% (!) 89% 93% 91%  Weight:        Intake/Output Summary (Last 24 hours) at 03/20/2024 0959 Last data filed at 03/19/2024 2021 Gross per 24 hour  Intake --  Output 300 ml  Net -300 ml   Filed Weights   03/16/24 1135  Weight: 77.4 kg    Scheduled Meds:  atorvastatin  80 mg Oral Daily   enoxaparin (LOVENOX) injection  40 mg Subcutaneous Daily   feeding supplement  237 mL Oral BID BM   folic acid  1 mg Oral Daily   multivitamin with minerals  1 tablet Oral Daily   pregabalin  150 mg Oral BID   thiamine  100 mg Oral Daily   Continuous Infusions:  piperacillin-tazobactam (ZOSYN)  IV 3.375 g (03/20/24 0530)    Nutritional status     Body mass index is 24.84 kg/m.  Data Reviewed:   CBC: Recent Labs  Lab 03/13/24 1315 03/13/24 1848 03/14/24 0330 03/15/24 0204 03/17/24 1200 03/19/24 0428  WBC 10.2  --  9.1 10.0 7.9 6.9  NEUTROABS 7.4  --   --   --   --   --   HGB 14.0 14.3 13.1 11.8* 11.6* 10.7*  HCT 40.5 42.0 39.3 35.2* 34.4* 31.2*  MCV 106.0*  --  109.2* 107.6* 107.2* 105.4*  PLT 104*  --  114* 126* 184 217   Basic Metabolic Panel: Recent Labs  Lab 03/14/24 0330 03/15/24 0204 03/16/24 0221 03/17/24 1200 03/19/24 0428  NA 139 136 137 137 135  K 3.6 4.0 3.6 4.0 4.4  CL 104 102 100 101 99  CO2 24 23 25 25 28   GLUCOSE 133* 145* 163* 179* 203*  BUN 8 10 9 11 14   CREATININE 0.98 0.88 0.96 1.01 1.08  CALCIUM 6.8* 7.0* 7.2* 8.0* 8.5*  MG 1.1* 2.0  --   --   --    GFR: Estimated Creatinine Clearance: 65.6 mL/min (by C-G formula based on  SCr of 1.08 mg/dL). Liver Function Tests: Recent Labs  Lab 03/14/24 0039 03/14/24 0330 03/16/24 0221 03/17/24 1200  AST 28  27 28  42* 43*  ALT 16  16 15 15 17   ALKPHOS 78  77 70 73 71  BILITOT 2.0*  2.0* 2.0* 0.5 0.6  PROT 6.1*  6.3* 5.9* 5.5* 5.6*  ALBUMIN 2.1*  2.3* 2.0* 1.7* 1.6*   Recent Labs  Lab 03/14/24 0039  LIPASE 23   No results for input(s): AMMONIA in the last 168 hours. Coagulation Profile: No results for input(s): INR, PROTIME in the last 168 hours. Cardiac Enzymes: No  results for input(s): CKTOTAL, CKMB, CKMBINDEX, TROPONINI in the last 168 hours. BNP (last 3 results) No results for input(s): PROBNP in the last 8760 hours. HbA1C: No results for input(s): HGBA1C in the last 72 hours. CBG: No results for input(s): GLUCAP in the last 168 hours. Lipid Profile: No results for input(s): CHOL, HDL, LDLCALC, TRIG, CHOLHDL, LDLDIRECT in the last 72 hours. Thyroid  Function Tests: No results for input(s): TSH, T4TOTAL, FREET4, T3FREE, THYROIDAB in the last 72 hours. Anemia Panel: No results for input(s): VITAMINB12, FOLATE, FERRITIN, TIBC, IRON, RETICCTPCT in the last 72 hours. Sepsis Labs: No results for input(s): PROCALCITON, LATICACIDVEN in the last 168 hours.  No results found for this or any previous visit (from the past 240 hours).       Radiology Studies: No results found.         LOS: 3 days   Time spent= 35 mins    Deliliah Room, MD [facility]  If 7PM-7AM, please contact night-coverage  03/20/2024, 9:59 AM

## 2024-03-21 DIAGNOSIS — F1010 Alcohol abuse, uncomplicated: Secondary | ICD-10-CM | POA: Diagnosis not present

## 2024-03-21 DIAGNOSIS — Z9181 History of falling: Secondary | ICD-10-CM | POA: Diagnosis not present

## 2024-03-21 DIAGNOSIS — E1169 Type 2 diabetes mellitus with other specified complication: Secondary | ICD-10-CM | POA: Diagnosis not present

## 2024-03-21 DIAGNOSIS — M109 Gout, unspecified: Secondary | ICD-10-CM | POA: Diagnosis not present

## 2024-03-21 DIAGNOSIS — M6281 Muscle weakness (generalized): Secondary | ICD-10-CM | POA: Diagnosis not present

## 2024-03-21 DIAGNOSIS — E785 Hyperlipidemia, unspecified: Secondary | ICD-10-CM | POA: Diagnosis not present

## 2024-03-21 DIAGNOSIS — R279 Unspecified lack of coordination: Secondary | ICD-10-CM | POA: Diagnosis not present

## 2024-03-21 DIAGNOSIS — I700 Atherosclerosis of aorta: Secondary | ICD-10-CM | POA: Diagnosis not present

## 2024-03-21 DIAGNOSIS — R0602 Shortness of breath: Secondary | ICD-10-CM | POA: Diagnosis not present

## 2024-03-21 DIAGNOSIS — E7800 Pure hypercholesterolemia, unspecified: Secondary | ICD-10-CM | POA: Diagnosis not present

## 2024-03-21 DIAGNOSIS — E119 Type 2 diabetes mellitus without complications: Secondary | ICD-10-CM | POA: Diagnosis not present

## 2024-03-21 DIAGNOSIS — K5900 Constipation, unspecified: Secondary | ICD-10-CM | POA: Diagnosis not present

## 2024-03-21 DIAGNOSIS — K5730 Diverticulosis of large intestine without perforation or abscess without bleeding: Secondary | ICD-10-CM | POA: Diagnosis not present

## 2024-03-21 DIAGNOSIS — I10 Essential (primary) hypertension: Secondary | ICD-10-CM | POA: Diagnosis not present

## 2024-03-21 DIAGNOSIS — G629 Polyneuropathy, unspecified: Secondary | ICD-10-CM | POA: Diagnosis not present

## 2024-03-21 DIAGNOSIS — R2689 Other abnormalities of gait and mobility: Secondary | ICD-10-CM | POA: Diagnosis not present

## 2024-03-21 DIAGNOSIS — R5381 Other malaise: Secondary | ICD-10-CM | POA: Diagnosis not present

## 2024-03-21 DIAGNOSIS — R7989 Other specified abnormal findings of blood chemistry: Secondary | ICD-10-CM | POA: Diagnosis not present

## 2024-03-21 DIAGNOSIS — E538 Deficiency of other specified B group vitamins: Secondary | ICD-10-CM | POA: Diagnosis not present

## 2024-03-21 DIAGNOSIS — F4321 Adjustment disorder with depressed mood: Secondary | ICD-10-CM | POA: Diagnosis not present

## 2024-03-21 DIAGNOSIS — G621 Alcoholic polyneuropathy: Secondary | ICD-10-CM | POA: Diagnosis not present

## 2024-03-21 DIAGNOSIS — R140 Abdominal distension (gaseous): Secondary | ICD-10-CM | POA: Diagnosis not present

## 2024-03-21 DIAGNOSIS — R0902 Hypoxemia: Secondary | ICD-10-CM | POA: Diagnosis not present

## 2024-03-21 DIAGNOSIS — K529 Noninfective gastroenteritis and colitis, unspecified: Secondary | ICD-10-CM | POA: Diagnosis not present

## 2024-03-21 LAB — CREATININE, SERUM
Creatinine, Ser: 0.96 mg/dL (ref 0.61–1.24)
GFR, Estimated: >60 mL/min (ref >60)

## 2024-03-21 MED ORDER — VITAMIN B-1 100 MG PO TABS: 100.0000 mg | ORAL_TABLET | Freq: Every day | ORAL | Status: AC

## 2024-03-21 MED ORDER — ENSURE PLUS HIGH PROTEIN PO LIQD: 237.0000 mL | Freq: Two times a day (BID) | ORAL | Status: DC

## 2024-03-21 MED ORDER — POLYETHYLENE GLYCOL 3350 17 G PO PACK: 17.0000 g | PACK | Freq: Every day | ORAL | Status: AC | PRN

## 2024-03-21 MED ORDER — ADULT MULTIVITAMIN W/MINERALS CH: 1.0000 | ORAL_TABLET | Freq: Every day | ORAL | Status: AC

## 2024-03-21 MED ORDER — PREGABALIN 150 MG PO CAPS: 150.0000 mg | ORAL_CAPSULE | Freq: Two times a day (BID) | ORAL | 0 refills | Status: AC

## 2024-03-21 MED ORDER — FOLIC ACID 1 MG PO TABS: 1.0000 mg | ORAL_TABLET | Freq: Every day | ORAL | Status: AC

## 2024-03-21 MED ORDER — HYDROCODONE-ACETAMINOPHEN 5-325 MG PO TABS: 1.0000 | ORAL_TABLET | Freq: Four times a day (QID) | ORAL | 0 refills | Status: DC | PRN

## 2024-03-21 NOTE — Progress Notes (Signed)
 PROGRESS NOTE    Kenneth Joseph  FMW:[id number] DOB: 07-29-1955 DOA: 03/13/2024 PCP: Claudene Pellet, MD   Brief Narrative:   68 y.o. male with history of diabetes mellitus type 2, hypertension, hyperlipidemia, B12 deficiency presents to the ER because of worsening pain in the lower extremities.  Patient had been having some tingling and numbness for last few months and had followed up with neurologist in June 2025 for dizziness when neurologist did diagnosed with with the likely from alcohol induced and had offered nerve studies but at that time per the notes patient had declined comes to the ER because of worsening pain in the bilateral lower extremity which has acutely worsened last 4 days.  Patient is finding it difficult to ambulate because of the pain.  Patient had recently traveled to [place]  last week and after returning over the last 4 days patient also has been having watery diarrhea which resolved soon as he was admitted. He is medically stable.  Currently, he is awaiting skilled nursing facility placement.  Assessment & Plan:  Principal Problem:   Colitis Active Problems:   Gout   Hypercholesterolemia   Hypertension   Macrocytosis   Peripheral polyneuropathy   Type 2 diabetes mellitus with other specified complication (HCC)   Type 2 diabetes mellitus without complications (HCC)   Vitamin B12 deficiency   Alcohol abuse   Acute possible bacterial colitis -  - Received intravenous zosyn.  Discontinued on 03/20/2024. -Patient is afebrile, and there is no evidence of leukocytosis    Bilateral lower extremity pain likely from neuropathy: Per neurology notes in June 2025 patient likely had neuropathy from alcohol abuse.  At that time patient had declined any nerve studies.  Dr. Franky discussed with on-call neurologist Dr. Voncile who felt patient's neuropathy could be from alcohol.  Will need outpatient nerve studies Continue with lyrica    Hypertension - Blood pressure  on lower side so we will hold medications   Diabetes mellitus type 2: Takes metformin and glipizide at home, presently on sliding scale coverage.  Hemoglobin A1c is 5.5.   B12 deficiency  -takes B12 shots every month.   Alcohol abuse: has not had any alcohol last 3 days because patient was finding it difficult to ambulate because of the pain. - CIWA   Hyperlipidemia: Continue with statin   Elevated bilirubin  - No acute issues   History of gout  - No signs of any gouty exacerbation.   Macrocytosis  - Replete folic acid  Disposition: Pending placement to skilled nursing facility   DVT prophylaxis: enoxaparin (LOVENOX) injection 40 mg Start: 03/14/24 1000     Code Status: Full Code Family Communication: None at the bedside Status is: Inpatient Remains inpatient appropriate because: Pending placement    Subjective:  No acute events overnight. Denied any active complaints. He said that he feels lousy.  Examination:  General exam: Appears calm and comfortable  Respiratory system: Clear to auscultation. Respiratory effort normal. Cardiovascular system: S1 & S2 heard, RRR. No JVD, murmurs, rubs, gallops or clicks. No pedal edema. Gastrointestinal system: Abdomen is nondistended, soft and nontender. No organomegaly or masses felt. Normal bowel sounds heard. Central nervous system: Alert and oriented. No focal neurological deficits. Extremities: Symmetric 5 x 5 power. Skin: No rashes, lesions or ulcers Psychiatry: Judgement and insight appear normal. Mood & affect appropriate.       Diet Orders (From admission, onward)     Start     Ordered   03/14/24 1238  Diet Carb Modified Fluid consistency: Thin; Room service appropriate? Yes  Diet effective now       Question Answer Comment  Diet-HS Snack? Nothing   Calorie Level Medium 1600-2000   Fluid consistency: Thin   Room service appropriate? Yes      03/14/24 1237            Objective: Vitals:   03/20/24  2024 03/20/24 2338 03/21/24 0550 03/21/24 0812  BP: 113/79 101/72 120/78 111/72  Pulse: 93 77 82 89  Resp: 20 20 20    Temp: 98.6 F (37 C) 98.7 F (37.1 C) 98 F (36.7 C) (!) 97.3 F (36.3 C)  TempSrc: Oral Oral Oral Oral  SpO2: 96% 96% 98% 98%  Weight:        Intake/Output Summary (Last 24 hours) at 03/21/2024 1007 Last data filed at 03/21/2024 0643 Gross per 24 hour  Intake 650 ml  Output 700 ml  Net -50 ml   Filed Weights   03/16/24 1135  Weight: 77.4 kg    Scheduled Meds:  atorvastatin  80 mg Oral Daily   enoxaparin (LOVENOX) injection  40 mg Subcutaneous Daily   feeding supplement  237 mL Oral BID BM   folic acid  1 mg Oral Daily   multivitamin with minerals  1 tablet Oral Daily   polyethylene glycol  17 g Oral Daily   pregabalin  150 mg Oral BID   thiamine  100 mg Oral Daily   Continuous Infusions:    Nutritional status     Body mass index is 24.84 kg/m.  Data Reviewed:   CBC: Recent Labs  Lab 03/15/24 0204 03/17/24 1200 03/19/24 0428  WBC 10.0 7.9 6.9  HGB 11.8* 11.6* 10.7*  HCT 35.2* 34.4* 31.2*  MCV 107.6* 107.2* 105.4*  PLT 126* 184 217   Basic Metabolic Panel: Recent Labs  Lab 03/15/24 0204 03/16/24 0221 03/17/24 1200 03/19/24 0428 03/21/24 0646  NA 136 137 137 135  --   K 4.0 3.6 4.0 4.4  --   CL 102 100 101 99  --   CO2 23 25 25 28   --   GLUCOSE 145* 163* 179* 203*  --   BUN 10 9 11 14   --   CREATININE 0.88 0.96 1.01 1.08 0.96  CALCIUM 7.0* 7.2* 8.0* 8.5*  --   MG 2.0  --   --   --   --    GFR: Estimated Creatinine Clearance: 73.9 mL/min (by C-G formula based on SCr of 0.96 mg/dL). Liver Function Tests: Recent Labs  Lab 03/16/24 0221 03/17/24 1200  AST 42* 43*  ALT 15 17  ALKPHOS 73 71  BILITOT 0.5 0.6  PROT 5.5* 5.6*  ALBUMIN 1.7* 1.6*   No results for input(s): LIPASE, AMYLASE in the last 168 hours.  No results for input(s): AMMONIA in the last 168 hours. Coagulation Profile: No results for input(s):  INR, PROTIME in the last 168 hours. Cardiac Enzymes: No results for input(s): CKTOTAL, CKMB, CKMBINDEX, TROPONINI in the last 168 hours. BNP (last 3 results) No results for input(s): PROBNP in the last 8760 hours. HbA1C: No results for input(s): HGBA1C in the last 72 hours. CBG: No results for input(s): GLUCAP in the last 168 hours. Lipid Profile: No results for input(s): CHOL, HDL, LDLCALC, TRIG, CHOLHDL, LDLDIRECT in the last 72 hours. Thyroid  Function Tests: No results for input(s): TSH, T4TOTAL, FREET4, T3FREE, THYROIDAB in the last 72 hours. Anemia Panel: No results for input(s): VITAMINB12, FOLATE, FERRITIN, TIBC,  IRON, RETICCTPCT in the last 72 hours. Sepsis Labs: No results for input(s): PROCALCITON, LATICACIDVEN in the last 168 hours.  No results found for this or any previous visit (from the past 240 hours).       Radiology Studies: No results found.      LOS: 4 days   Time spent= 35 mins    Deliliah Room, MD [facility]  If 7PM-7AM, please contact night-coverage  03/21/2024, 10:07 AM

## 2024-03-21 NOTE — Progress Notes (Signed)
 Spoke with Nurse from [facility] and provided report. [facility] has been called and patient awaits transportation.

## 2024-03-21 NOTE — Progress Notes (Signed)
 1509 attempted to call report operator transferred me to nurses station but no answer from [facility]. LVM and number for a call back.

## 2024-03-21 NOTE — Plan of Care (Signed)

## 2024-03-21 NOTE — Discharge Summary (Signed)
 Physician Discharge Summary   Patient: Kenneth Joseph MRN: [id number] DOB: 05/23/1956  Admit date:     03/13/2024  Discharge date: 03/21/24  Discharge Physician: Deliliah Room   PCP: Claudene Pellet, MD   Recommendations at discharge:    F/u with your PCP in one week F/u with neurology in one month.  Discharge Diagnoses: Principal Problem:   Colitis Active Problems:   Gout   Hypercholesterolemia   Hypertension   Macrocytosis   Peripheral polyneuropathy   Type 2 diabetes mellitus with other specified complication (HCC)   Type 2 diabetes mellitus without complications (HCC)   Vitamin B12 deficiency   Alcohol abuse    Hospital Course: 68 y.o. male with history of diabetes mellitus type 2, hypertension, hyperlipidemia, B12 deficiency presents to the ER because of worsening pain in the lower extremities.  Patient had been having some tingling and numbness for last few months and had followed up with neurologist in June 2025 for dizziness when neurologist did diagnosed with with the likely from alcohol induced and had offered nerve studies but at that time per the notes patient had declined comes to the ER because of worsening pain in the bilateral lower extremity which has acutely worsened last 4 days.  Patient is finding it difficult to ambulate because of the pain.   Acute possible bacterial colitis -  - Received intravenous zosyn.  Discontinued on 03/20/2024. -Patient is afebrile, and there is no evidence of leukocytosis     Bilateral lower extremity pain likely from neuropathy: Per neurology notes in June 2025 patient likely had neuropathy from alcohol abuse.  At that time patient had declined any nerve studies.  Dr. Franky discussed with on-call neurologist Dr. Voncile who felt patient's neuropathy could be from alcohol.  Will need outpatient nerve studies Continue with lyrica 150 mg PO BID.     Hypertension - Blood pressure on lower side so we will hold medications    Diabetes mellitus type 2: Takes metformin and glipizide at home,  Hemoglobin A1c is 5.5.   B12 deficiency  -takes B12 shots every month.   Alcohol abuse: has not had any alcohol last 3 days because patient was finding it difficult to ambulate because of the pain. - Continue with folate and thiamine supplementation.   Hyperlipidemia: Continue with statin   Elevated bilirubin  - No acute issues   History of gout  - No signs of any gouty exacerbation.   Macrocytosis  - Replete folic acid   Disposition: [facility]       Consultants: None Procedures performed: None  Disposition: Skilled nursing facility Diet recommendation:  Carb modified diet DISCHARGE MEDICATION: Allergies as of 03/21/2024       Reactions   Colchicine Diarrhea, Nausea And Vomiting        Medication List     STOP taking these medications    meloxicam 15 MG tablet Commonly known as: MOBIC   olmesartan 20 MG tablet Commonly known as: BENICAR       TAKE these medications    atorvastatin 80 MG tablet Commonly known as: LIPITOR Take 80 mg by mouth in the morning.   feeding supplement Liqd Take 237 mLs by mouth 2 (two) times daily between meals.   folic acid 1 MG tablet Commonly known as: FOLVITE Take 1 tablet (1 mg total) by mouth daily. Start taking on: March 22, 2024   glipiZIDE-metformin 5-500 MG tablet Commonly known as: METAGLIP Take 1 tablet by mouth in the morning. Take  one tablet by mouth in the morning.   HYDROcodone -acetaminophen  5-325 MG tablet Commonly known as: NORCO/VICODIN Take 1 tablet by mouth every 6 (six) hours as needed for severe pain (pain score 7-10).   multivitamin with minerals Tabs tablet Take 1 tablet by mouth daily. Start taking on: March 22, 2024   polyethylene glycol 17 g packet Commonly known as: MIRALAX / GLYCOLAX Take 17 g by mouth daily as needed for moderate constipation.   pregabalin 150 MG capsule Commonly known as:  LYRICA Take 1 capsule (150 mg total) by mouth 2 (two) times daily.   thiamine 100 MG tablet Commonly known as: Vitamin B-1 Take 1 tablet (100 mg total) by mouth daily. Start taking on: March 22, 2024        Contact information for follow-up providers     Claudene Pellet, MD. Schedule an appointment as soon as possible for a visit in 1 week(s).   Specialty: Family Medicine Contact information: [phone] [place] [address] [phone]         [facility]. Schedule an appointment as soon as possible for a visit in 1 month(s).   Contact information: [address] [place], [address] [place]  [address] [phone]             Contact information for after-discharge care     Destination     [facility] .   Service: Skilled Nursing Contact information: [address] [place]  [address] [phone]                    Discharge Exam: Filed Weights   03/16/24 1135  Weight: 77.4 kg   Constitutional: NAD, calm, comfortable Eyes: PERRL, lids and conjunctivae normal ENMT: Mucous membranes are moist. Posterior pharynx clear of any exudate or lesions.Normal dentition.  Neck: normal, supple, no masses, no thyromegaly Respiratory: clear to auscultation bilaterally, no wheezing, no crackles. Normal respiratory effort. No accessory muscle use.  Cardiovascular: Regular rate and rhythm, no murmurs / rubs / gallops. No extremity edema. 2+ pedal pulses. No carotid bruits.  Abdomen: no tenderness, no masses palpated. No hepatosplenomegaly. Bowel sounds positive.  Musculoskeletal: no clubbing / cyanosis. No joint deformity upper and lower extremities. Good ROM, no contractures. Normal muscle tone.  Skin: no rashes, lesions, ulcers. No induration Neurologic: CN 2-12 grossly intact. Sensation intact, DTR normal. Strength 5/5 x all 4 extremities.  Psychiatric: Normal judgment and insight. Alert and oriented x  3. Normal mood.    Condition at discharge: good  The results of significant diagnostics from this hospitalization (including imaging, microbiology, ancillary and laboratory) are listed below for reference.   Imaging Studies: VAS US  LOWER EXTREMITY VENOUS (DVT) Result Date: 03/18/2024  Lower Venous DVT Study Patient Name:  NYCHOLAS RAYNER  Date of Exam:   03/17/2024 Medical Rec #: [id number]        Accession #:    [id number] Date of Birth: 07-26-1955        Patient Gender: M Patient Age:   47 years Exam Location:  [facility] Procedure:      VAS US  LOWER EXTREMITY VENOUS (DVT) Referring Phys: JESSICA VANN --------------------------------------------------------------------------------  Indications: Worsening lower extremity pain. 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  Comparison Study: No prior study on file Performing Technologist: Pellet Lis RVS  Examination Guidelines: A complete evaluation includes B-mode imaging, spectral Doppler, color Doppler, and power Doppler as needed of all accessible portions of each  vessel. Bilateral testing is considered an integral part of a complete examination. Limited examinations for reoccurring indications may be performed as noted. The reflux portion of the exam is performed with the patient in reverse Trendelenburg.  +---------+---------------+---------+-----------+----------+--------------+ RIGHT    CompressibilityPhasicitySpontaneityPropertiesThrombus Aging +---------+---------------+---------+-----------+----------+--------------+ CFV      Full           Yes      Yes                                 +---------+---------------+---------+-----------+----------+--------------+ SFJ      Full                                                        +---------+---------------+---------+-----------+----------+--------------+ FV Prox  Full           Yes      Yes                                  +---------+---------------+---------+-----------+----------+--------------+ FV Mid   Full                                                        +---------+---------------+---------+-----------+----------+--------------+ FV DistalFull                                                        +---------+---------------+---------+-----------+----------+--------------+ PFV      Full                                                        +---------+---------------+---------+-----------+----------+--------------+ POP      Full           Yes      Yes                                 +---------+---------------+---------+-----------+----------+--------------+ PTV      Full                                                        +---------+---------------+---------+-----------+----------+--------------+ PERO     Full                                                        +---------+---------------+---------+-----------+----------+--------------+   +---------+---------------+---------+-----------+----------+--------------+ LEFT     CompressibilityPhasicitySpontaneityPropertiesThrombus Aging +---------+---------------+---------+-----------+----------+--------------+ CFV  Full           Yes      Yes                                 +---------+---------------+---------+-----------+----------+--------------+ SFJ      Full                                                        +---------+---------------+---------+-----------+----------+--------------+ FV Prox  Full                                                        +---------+---------------+---------+-----------+----------+--------------+ FV Mid   Full                                                        +---------+---------------+---------+-----------+----------+--------------+ FV DistalFull                                                         +---------+---------------+---------+-----------+----------+--------------+ PFV      Full                                                        +---------+---------------+---------+-----------+----------+--------------+ POP      Full           Yes      Yes                                 +---------+---------------+---------+-----------+----------+--------------+ PTV      Full                                                        +---------+---------------+---------+-----------+----------+--------------+ PERO     Full                                                        +---------+---------------+---------+-----------+----------+--------------+     Summary: BILATERAL: - No evidence of deep vein thrombosis seen in the lower extremities, bilaterally. -No evidence of popliteal cyst, bilaterally.   *See table(s) above for measurements and observations. Electronically signed by Lonni Gaskins MD on 03/18/2024 at 1:16:22 PM.    Final    DG CHEST PORT 1 VIEW Result Date: 03/17/2024 CLINICAL  DATA:  Fever. EXAM: PORTABLE CHEST 1 VIEW COMPARISON:  03/13/2024 FINDINGS: Lungs are adequately inflated and otherwise clear. Cardiomediastinal silhouette and remainder of the exam is unchanged. IMPRESSION: No active disease. Electronically Signed   By: Toribio Agreste M.D.   On: 03/17/2024 10:46   US  Abdomen Limited RUQ (LIVER/GB) Result Date: 03/14/2024 EXAM: Right Upper Quadrant Abdominal Ultrasound 03/14/2024 04:56:00 AM TECHNIQUE: Real-time ultrasonography of the right upper quadrant of the abdomen was performed. COMPARISON: CT of the abdomen and pelvis dated 03/13/2024. CLINICAL HISTORY: 6194 Acute cholecystitis 6194. Acute cholecystitis 6194. FINDINGS: LIVER: The liver is diffusely echogenic suggesting underlying steatosis. No intrahepatic biliary ductal dilatation. No evidence of mass. There is normal hepatopetal blood flow within the portal vein. BILIARY SYSTEM: No pericholecystic fluid.  The gallbladder wall measures 4 mm in thickness. No cholelithiasis. The patient is not focally tender over the gallbladder. Common bile duct is within normal limits measuring 5 mm. RIGHT KIDNEY: The right kidney is grossly unremarkable in appearances without evidence of hydronephrosis, echogenic calculi or worrisome mass lesions. OTHER: No right upper quadrant ascites. IMPRESSION: 1. No sonographic evidence of acute cholecystitis. 2. Hepatic steatosis. Electronically signed by: Evalene Coho MD 03/14/2024 05:05 AM EDT RP Workstation: [id number]   CT ABDOMEN PELVIS W CONTRAST Result Date: 03/13/2024 EXAM: CT ABDOMEN AND PELVIS WITH CONTRAST 03/13/2024 07:19:36 PM TECHNIQUE: CT of the abdomen and pelvis was performed with the administration of 75 mL of iohexol (OMNIPAQUE) 350 MG/ML injection. Multiplanar reformatted images are provided for review. Automated exposure control, iterative reconstruction, and/or weight-based adjustment of the mA/kV was utilized to reduce the radiation dose to as low as reasonably achievable. COMPARISON: CT abdomen and pelvis 03/14/2018. CLINICAL HISTORY: Diarrhea. Hypotension; Diarrhea x several days; Hx of hep C; 75 ml omni 350 FINDINGS: LOWER CHEST: No acute abnormality. LIVER: The liver is unremarkable. GALLBLADDER AND BILE DUCTS: There is trace inflammatory stranding surrounding the gallbladder. The gallbladder is distended. No biliary ductal dilatation. SPLEEN: No acute abnormality. PANCREAS: No acute abnormality. ADRENAL GLANDS: No acute abnormality. KIDNEYS, URETERS AND BLADDER: There are punctate nonobstructing bilateral renal calculi, left greater than right. No hydronephrosis. No perinephric or periureteral stranding. Urinary bladder is unremarkable. GI AND BOWEL: Stomach demonstrates no acute abnormality. There is some mild diffuse colonic wall thickening. There is descending and sigmoid colon diverticulosis. The appendix is within normal limits. There is no bowel  obstruction. PERITONEUM AND RETROPERITONEUM: No ascites. No free air. VASCULATURE: Aorta is normal in caliber. There are atherosclerotic calcifications of the aorta. LYMPH NODES: No lymphadenopathy. REPRODUCTIVE ORGANS: Prostate gland is mildly enlarged. BONES AND SOFT TISSUES: There is chronic compression deformity of the superior endplate of T11 and T12 which appears new from 2019. There are small fat-containing bilateral inguinal hernias. No focal soft tissue abnormality. IMPRESSION: 1. Mild diffuse colonic wall thickening worrisome for nonspecific colitis . 2. Findings suspicious for acute cholecystitis. 3. Descending and sigmoid colon diverticulosis without evidence of diverticulitis. 4. Punctate nonobstructing bilateral renal calculi, left greater than right. 5. Chronic compression deformities of the superior endplates of T11 and T12, new from 2019. correlate clinically. Electronically signed by: Greig Pique MD 03/13/2024 07:37 PM EDT RP Workstation: [id number]   DG Chest Portable 1 View Result Date: 03/13/2024 EXAM: 1 VIEW(S) XRAY OF THE CHEST 03/13/2024 04:50:00 PM COMPARISON: 12 slash 21 slash 23 CLINICAL HISTORY: near syncope FINDINGS: LUNGS AND PLEURA: No focal pulmonary opacity. No pulmonary edema. No pleural effusion. No pneumothorax. HEART AND MEDIASTINUM: No acute abnormality of the cardiac and mediastinal  silhouettes. BONES AND SOFT TISSUES: Partially visible lower cervical spine ACDF hardware. No acute osseous abnormality. IMPRESSION: 1. No acute cardiopulmonary process . Electronically signed by: Norman Gatlin MD 03/13/2024 05:15 PM EDT RP Workstation: [id number]    Microbiology: Results for orders placed or performed during the hospital encounter of 10/14/21  Surgical pcr screen     Status: None   Collection Time: 10/14/21 10:52 AM   Specimen: Nasal Mucosa; Nasal Swab  Result Value Ref Range Status   MRSA, PCR NEGATIVE NEGATIVE Final   Staphylococcus aureus NEGATIVE NEGATIVE Final     Comment: (NOTE) The Xpert SA Assay (FDA approved for NASAL specimens in patients 78 years of age and older), is one component of a comprehensive surveillance program. It is not intended to diagnose infection nor to guide or monitor treatment. Performed at [facility], [address] [place], [place] [address]     Labs: CBC: Recent Labs  Lab 03/15/24 0204 03/17/24 1200 03/19/24 0428  WBC 10.0 7.9 6.9  HGB 11.8* 11.6* 10.7*  HCT 35.2* 34.4* 31.2*  MCV 107.6* 107.2* 105.4*  PLT 126* 184 217   Basic Metabolic Panel: Recent Labs  Lab 03/15/24 0204 03/16/24 0221 03/17/24 1200 03/19/24 0428 03/21/24 0646  NA 136 137 137 135  --   K 4.0 3.6 4.0 4.4  --   CL 102 100 101 99  --   CO2 23 25 25 28   --   GLUCOSE 145* 163* 179* 203*  --   BUN 10 9 11 14   --   CREATININE 0.88 0.96 1.01 1.08 0.96  CALCIUM 7.0* 7.2* 8.0* 8.5*  --   MG 2.0  --   --   --   --    Liver Function Tests: Recent Labs  Lab 03/16/24 0221 03/17/24 1200  AST 42* 43*  ALT 15 17  ALKPHOS 73 71  BILITOT 0.5 0.6  PROT 5.5* 5.6*  ALBUMIN 1.7* 1.6*   CBG: No results for input(s): GLUCAP in the last 168 hours.  Discharge time spent: 42 minutes.  Signed: Deliliah Room, MD [facility] 03/21/2024

## 2024-03-21 NOTE — TOC Transition Note (Addendum)
 Transition of Care [facility]) - Discharge Note   Patient Details  Name: Kenneth Joseph MRN: [id number] Date of Birth: [birth year]  Transition of Care [facility]) CM/SW Contact:  Sherline Clack, LCSWA Phone Number: 03/21/2024, 1:51 PM   Clinical Narrative:     Patient's insurance authorization was approved 89/76-89/70 Cert#[id number].   Patient will DC to: [facility] Anticipated DC date: 03/21/24  Family notified: Awilda/sister Transport by: [facility]   Per MD patient ready for DC to [facility]. RN to call report prior to discharge [phone], room 116 ). RN, patient, patient's family, and facility notified of DC. Discharge Summary and FL2 sent to facility. DC packet on chart. Ambulance transport requested for patient.   CSW will sign off for now as social work intervention is no longer needed. Please consult us  again if new needs arise.    Final next level of care: Skilled Nursing Facility Barriers to Discharge: Barriers Resolved   Patient Goals and CMS Choice     Choice offered to / list presented to : Patient      Discharge Placement                Patient to be transferred to facility by: [facility] Name of family member notified: Awilda/sister Patient and family notified of of transfer: 03/21/24  Discharge Plan and Services Additional resources added to the After Visit Summary for                                       Social Drivers of Health (SDOH) Interventions SDOH Screenings   Food Insecurity: No Food Insecurity (03/14/2024)  Housing: Low Risk  (03/14/2024)  Transportation Needs: No Transportation Needs (03/14/2024)  Utilities: Not At Risk (03/14/2024)  Depression (PHQ2-9): Low Risk  (02/19/2024)  Social Connections: Unknown (03/14/2024)  Tobacco Use: Low Risk  (03/14/2024)     Readmission Risk Interventions     No data to display

## 2024-03-22 DIAGNOSIS — F1010 Alcohol abuse, uncomplicated: Secondary | ICD-10-CM | POA: Diagnosis not present

## 2024-03-22 DIAGNOSIS — E7800 Pure hypercholesterolemia, unspecified: Secondary | ICD-10-CM | POA: Diagnosis not present

## 2024-03-22 DIAGNOSIS — K529 Noninfective gastroenteritis and colitis, unspecified: Secondary | ICD-10-CM | POA: Diagnosis not present

## 2024-03-22 DIAGNOSIS — E1169 Type 2 diabetes mellitus with other specified complication: Secondary | ICD-10-CM | POA: Diagnosis not present

## 2024-03-22 DIAGNOSIS — M109 Gout, unspecified: Secondary | ICD-10-CM | POA: Diagnosis not present

## 2024-03-22 DIAGNOSIS — E538 Deficiency of other specified B group vitamins: Secondary | ICD-10-CM | POA: Diagnosis not present

## 2024-03-22 DIAGNOSIS — G621 Alcoholic polyneuropathy: Secondary | ICD-10-CM | POA: Diagnosis not present

## 2024-03-22 DIAGNOSIS — I10 Essential (primary) hypertension: Secondary | ICD-10-CM | POA: Diagnosis not present

## 2024-03-22 DIAGNOSIS — M6281 Muscle weakness (generalized): Secondary | ICD-10-CM | POA: Diagnosis not present

## 2024-03-22 DIAGNOSIS — I700 Atherosclerosis of aorta: Secondary | ICD-10-CM | POA: Diagnosis not present

## 2024-03-22 DIAGNOSIS — K5900 Constipation, unspecified: Secondary | ICD-10-CM | POA: Diagnosis not present

## 2024-03-25 DIAGNOSIS — I700 Atherosclerosis of aorta: Secondary | ICD-10-CM | POA: Diagnosis not present

## 2024-03-25 DIAGNOSIS — E538 Deficiency of other specified B group vitamins: Secondary | ICD-10-CM | POA: Diagnosis not present

## 2024-03-25 DIAGNOSIS — I10 Essential (primary) hypertension: Secondary | ICD-10-CM | POA: Diagnosis not present

## 2024-03-25 DIAGNOSIS — K529 Noninfective gastroenteritis and colitis, unspecified: Secondary | ICD-10-CM | POA: Diagnosis not present

## 2024-03-25 DIAGNOSIS — E1169 Type 2 diabetes mellitus with other specified complication: Secondary | ICD-10-CM | POA: Diagnosis not present

## 2024-03-25 DIAGNOSIS — E7800 Pure hypercholesterolemia, unspecified: Secondary | ICD-10-CM | POA: Diagnosis not present

## 2024-03-25 DIAGNOSIS — K5900 Constipation, unspecified: Secondary | ICD-10-CM | POA: Diagnosis not present

## 2024-03-25 DIAGNOSIS — G621 Alcoholic polyneuropathy: Secondary | ICD-10-CM | POA: Diagnosis not present

## 2024-03-28 DIAGNOSIS — G629 Polyneuropathy, unspecified: Secondary | ICD-10-CM | POA: Diagnosis not present

## 2024-03-28 DIAGNOSIS — K529 Noninfective gastroenteritis and colitis, unspecified: Secondary | ICD-10-CM | POA: Diagnosis not present

## 2024-03-28 DIAGNOSIS — E119 Type 2 diabetes mellitus without complications: Secondary | ICD-10-CM | POA: Diagnosis not present

## 2024-03-28 DIAGNOSIS — R5381 Other malaise: Secondary | ICD-10-CM | POA: Diagnosis not present

## 2024-03-28 DIAGNOSIS — E785 Hyperlipidemia, unspecified: Secondary | ICD-10-CM | POA: Diagnosis not present

## 2024-04-01 ENCOUNTER — Telehealth: Payer: Self-pay

## 2024-04-01 NOTE — Patient Outreach (Signed)
 Spoke with Kenneth Joseph, confirms he is still inpatient at [facility], unsure about discharge date.  Will pause program for now.

## 2024-04-05 ENCOUNTER — Other Ambulatory Visit: Payer: Self-pay | Admitting: Licensed Clinical Social Worker

## 2024-04-07 ENCOUNTER — Other Ambulatory Visit: Payer: Self-pay

## 2024-04-07 ENCOUNTER — Emergency Department (HOSPITAL_COMMUNITY)

## 2024-04-07 ENCOUNTER — Inpatient Hospital Stay (HOSPITAL_COMMUNITY)
Admission: EM | Admit: 2024-04-07 | Discharge: 2024-04-19 | DRG: 640 | Disposition: A | Discharge status: Skilled Nursing Facility | Source: Skilled Nursing Facility

## 2024-04-07 DIAGNOSIS — K529 Noninfective gastroenteritis and colitis, unspecified: Secondary | ICD-10-CM | POA: Diagnosis not present

## 2024-04-07 DIAGNOSIS — G621 Alcoholic polyneuropathy: Secondary | ICD-10-CM | POA: Diagnosis not present

## 2024-04-07 DIAGNOSIS — E1142 Type 2 diabetes mellitus with diabetic polyneuropathy: Secondary | ICD-10-CM | POA: Diagnosis not present

## 2024-04-07 DIAGNOSIS — Z8249 Family history of ischemic heart disease and other diseases of the circulatory system: Secondary | ICD-10-CM | POA: Diagnosis not present

## 2024-04-07 DIAGNOSIS — M6281 Muscle weakness (generalized): Secondary | ICD-10-CM | POA: Diagnosis present

## 2024-04-07 DIAGNOSIS — F1091 Alcohol use, unspecified, in remission: Secondary | ICD-10-CM

## 2024-04-07 DIAGNOSIS — I700 Atherosclerosis of aorta: Secondary | ICD-10-CM | POA: Diagnosis not present

## 2024-04-07 DIAGNOSIS — R531 Weakness: Secondary | ICD-10-CM | POA: Diagnosis not present

## 2024-04-07 DIAGNOSIS — Z888 Allergy status to other drugs, medicaments and biological substances status: Secondary | ICD-10-CM

## 2024-04-07 DIAGNOSIS — Z794 Long term (current) use of insulin: Secondary | ICD-10-CM

## 2024-04-07 DIAGNOSIS — E1140 Type 2 diabetes mellitus with diabetic neuropathy, unspecified: Secondary | ICD-10-CM | POA: Diagnosis not present

## 2024-04-07 DIAGNOSIS — E119 Type 2 diabetes mellitus without complications: Secondary | ICD-10-CM | POA: Diagnosis not present

## 2024-04-07 DIAGNOSIS — E875 Hyperkalemia: Secondary | ICD-10-CM | POA: Diagnosis not present

## 2024-04-07 DIAGNOSIS — Z7401 Bed confinement status: Secondary | ICD-10-CM | POA: Diagnosis not present

## 2024-04-07 DIAGNOSIS — R609 Edema, unspecified: Secondary | ICD-10-CM | POA: Diagnosis not present

## 2024-04-07 DIAGNOSIS — D529 Folate deficiency anemia, unspecified: Secondary | ICD-10-CM | POA: Diagnosis present

## 2024-04-07 DIAGNOSIS — I10 Essential (primary) hypertension: Secondary | ICD-10-CM | POA: Diagnosis present

## 2024-04-07 DIAGNOSIS — M25531 Pain in right wrist: Secondary | ICD-10-CM | POA: Diagnosis not present

## 2024-04-07 DIAGNOSIS — Z8669 Personal history of other diseases of the nervous system and sense organs: Secondary | ICD-10-CM | POA: Diagnosis not present

## 2024-04-07 DIAGNOSIS — Z743 Need for continuous supervision: Secondary | ICD-10-CM | POA: Diagnosis not present

## 2024-04-07 DIAGNOSIS — G9341 Metabolic encephalopathy: Secondary | ICD-10-CM | POA: Diagnosis present

## 2024-04-07 DIAGNOSIS — E860 Dehydration: Secondary | ICD-10-CM | POA: Diagnosis not present

## 2024-04-07 DIAGNOSIS — R32 Unspecified urinary incontinence: Secondary | ICD-10-CM | POA: Diagnosis not present

## 2024-04-07 DIAGNOSIS — Z833 Family history of diabetes mellitus: Secondary | ICD-10-CM

## 2024-04-07 DIAGNOSIS — G622 Polyneuropathy due to other toxic agents: Secondary | ICD-10-CM | POA: Diagnosis not present

## 2024-04-07 DIAGNOSIS — R000 Tachycardia, unspecified: Secondary | ICD-10-CM | POA: Diagnosis not present

## 2024-04-07 DIAGNOSIS — M8580 Other specified disorders of bone density and structure, unspecified site: Secondary | ICD-10-CM | POA: Diagnosis not present

## 2024-04-07 DIAGNOSIS — Z79899 Other long term (current) drug therapy: Secondary | ICD-10-CM

## 2024-04-07 DIAGNOSIS — R5381 Other malaise: Secondary | ICD-10-CM | POA: Diagnosis present

## 2024-04-07 DIAGNOSIS — G9340 Encephalopathy, unspecified: Secondary | ICD-10-CM | POA: Diagnosis not present

## 2024-04-07 DIAGNOSIS — G629 Polyneuropathy, unspecified: Secondary | ICD-10-CM | POA: Diagnosis not present

## 2024-04-07 DIAGNOSIS — Z818 Family history of other mental and behavioral disorders: Secondary | ICD-10-CM

## 2024-04-07 DIAGNOSIS — F05 Delirium due to known physiological condition: Secondary | ICD-10-CM | POA: Diagnosis not present

## 2024-04-07 DIAGNOSIS — E538 Deficiency of other specified B group vitamins: Secondary | ICD-10-CM | POA: Diagnosis not present

## 2024-04-07 DIAGNOSIS — F1011 Alcohol abuse, in remission: Secondary | ICD-10-CM | POA: Diagnosis not present

## 2024-04-07 DIAGNOSIS — Z823 Family history of stroke: Secondary | ICD-10-CM

## 2024-04-07 DIAGNOSIS — K5730 Diverticulosis of large intestine without perforation or abscess without bleeding: Secondary | ICD-10-CM | POA: Diagnosis not present

## 2024-04-07 DIAGNOSIS — M109 Gout, unspecified: Secondary | ICD-10-CM | POA: Diagnosis present

## 2024-04-07 DIAGNOSIS — E512 Wernicke's encephalopathy: Secondary | ICD-10-CM | POA: Diagnosis not present

## 2024-04-07 DIAGNOSIS — E785 Hyperlipidemia, unspecified: Secondary | ICD-10-CM | POA: Diagnosis not present

## 2024-04-07 DIAGNOSIS — E7800 Pure hypercholesterolemia, unspecified: Secondary | ICD-10-CM | POA: Diagnosis not present

## 2024-04-07 DIAGNOSIS — I6523 Occlusion and stenosis of bilateral carotid arteries: Secondary | ICD-10-CM | POA: Diagnosis not present

## 2024-04-07 DIAGNOSIS — R4182 Altered mental status, unspecified: Principal | ICD-10-CM

## 2024-04-07 DIAGNOSIS — Z87442 Personal history of urinary calculi: Secondary | ICD-10-CM

## 2024-04-07 DIAGNOSIS — Z7984 Long term (current) use of oral hypoglycemic drugs: Secondary | ICD-10-CM | POA: Diagnosis not present

## 2024-04-07 DIAGNOSIS — M7989 Other specified soft tissue disorders: Secondary | ICD-10-CM | POA: Diagnosis not present

## 2024-04-07 DIAGNOSIS — R410 Disorientation, unspecified: Secondary | ICD-10-CM | POA: Diagnosis not present

## 2024-04-07 DIAGNOSIS — R41841 Cognitive communication deficit: Secondary | ICD-10-CM | POA: Diagnosis not present

## 2024-04-07 DIAGNOSIS — R2689 Other abnormalities of gait and mobility: Secondary | ICD-10-CM | POA: Diagnosis not present

## 2024-04-07 DIAGNOSIS — R404 Transient alteration of awareness: Secondary | ICD-10-CM | POA: Diagnosis not present

## 2024-04-07 DIAGNOSIS — K449 Diaphragmatic hernia without obstruction or gangrene: Secondary | ICD-10-CM | POA: Diagnosis not present

## 2024-04-07 DIAGNOSIS — G9349 Other encephalopathy: Secondary | ICD-10-CM | POA: Diagnosis not present

## 2024-04-07 DIAGNOSIS — D539 Nutritional anemia, unspecified: Secondary | ICD-10-CM | POA: Diagnosis not present

## 2024-04-07 LAB — CBG MONITORING, ED: Glucose-Capillary: 95 mg/dL (ref 70–99)

## 2024-04-07 LAB — URINALYSIS, ROUTINE W REFLEX MICROSCOPIC
Bacteria, UA: NONE SEEN
Bilirubin Urine: NEGATIVE
Glucose, UA: NEGATIVE mg/dL
Ketones, ur: NEGATIVE mg/dL
Leukocytes,Ua: NEGATIVE
Nitrite: NEGATIVE
Protein, ur: NEGATIVE mg/dL
Specific Gravity, Urine: 1.016 (ref 1.005–1.030)
pH: 7 (ref 5.0–8.0)

## 2024-04-07 LAB — CBC WITH DIFFERENTIAL/PLATELET
Abs Immature Granulocytes: 0.02 K/uL (ref 0.00–0.07)
Basophils Absolute: 0 K/uL (ref 0.0–0.1)
Basophils Relative: 0 %
Eosinophils Absolute: 0 K/uL (ref 0.0–0.5)
Eosinophils Relative: 0 %
HCT: 39 % (ref 39.0–52.0)
Hemoglobin: 12.3 g/dL — ABNORMAL LOW (ref 13.0–17.0)
Immature Granulocytes: 0 %
Lymphocytes Relative: 14 %
Lymphs Abs: 1.3 K/uL (ref 0.7–4.0)
MCH: 32 pg (ref 26.0–34.0)
MCHC: 31.5 g/dL (ref 30.0–36.0)
MCV: 101.6 fL — ABNORMAL HIGH (ref 80.0–100.0)
Monocytes Absolute: 1.2 K/uL — ABNORMAL HIGH (ref 0.1–1.0)
Monocytes Relative: 13 %
Neutro Abs: 6.8 K/uL (ref 1.7–7.7)
Neutrophils Relative %: 73 %
Platelets: 305 K/uL (ref 150–400)
RBC: 3.84 MIL/uL — ABNORMAL LOW (ref 4.22–5.81)
RDW: 15.7 % — ABNORMAL HIGH (ref 11.5–15.5)
WBC: 9.4 K/uL (ref 4.0–10.5)
nRBC: 0 % (ref 0.0–0.2)

## 2024-04-07 LAB — COMPREHENSIVE METABOLIC PANEL WITH GFR
ALT: 17 U/L (ref 0–44)
AST: 33 U/L (ref 15–41)
Albumin: 3.4 g/dL — ABNORMAL LOW (ref 3.5–5.0)
Alkaline Phosphatase: 103 U/L (ref 38–126)
Anion gap: 11 (ref 5–15)
BUN: 11 mg/dL (ref 8–23)
CO2: 28 mmol/L (ref 22–32)
Calcium: 10.1 mg/dL (ref 8.9–10.3)
Chloride: 100 mmol/L (ref 98–111)
Creatinine, Ser: 0.97 mg/dL (ref 0.61–1.24)
GFR, Estimated: >60 mL/min (ref >60)
Glucose, Bld: 107 mg/dL — ABNORMAL HIGH (ref 70–99)
Potassium: 5.2 mmol/L — ABNORMAL HIGH (ref 3.5–5.1)
Sodium: 138 mmol/L (ref 135–145)
Total Bilirubin: 1.1 mg/dL (ref 0.0–1.2)
Total Protein: 8.6 g/dL — ABNORMAL HIGH (ref 6.5–8.1)

## 2024-04-07 LAB — ETHANOL: Alcohol, Ethyl (B): <15 mg/dL (ref <15)

## 2024-04-07 LAB — AMMONIA: Ammonia: 17 umol/L (ref 9–35)

## 2024-04-07 MED ORDER — FOLIC ACID 1 MG PO TABS
1.0000 mg | ORAL_TABLET | Freq: Every day | ORAL | Status: DC
  Administered 2024-04-08 – 2024-04-19 (×12): 1 mg via ORAL
  Filled 2024-04-07 (×12): qty 1

## 2024-04-07 MED ORDER — PREGABALIN 50 MG PO CAPS: 150.0000 mg | ORAL_CAPSULE | Freq: Two times a day (BID) | ORAL | Status: DC

## 2024-04-07 MED ORDER — THIAMINE HCL 100 MG/ML IJ SOLN
250.0000 mg | Freq: Every day | INTRAVENOUS | Status: DC
  Filled 2024-04-07: qty 2.5

## 2024-04-07 MED ORDER — ADULT MULTIVITAMIN W/MINERALS CH
1.0000 | ORAL_TABLET | Freq: Every day | ORAL | Status: DC
  Administered 2024-04-08 – 2024-04-19 (×12): 1 via ORAL
  Filled 2024-04-07 (×12): qty 1

## 2024-04-07 MED ORDER — SODIUM CHLORIDE 0.9 % IV SOLN: INTRAVENOUS | Status: AC

## 2024-04-07 MED ORDER — ENOXAPARIN SODIUM 40 MG/0.4ML IJ SOSY
40.0000 mg | PREFILLED_SYRINGE | INTRAMUSCULAR | Status: DC
  Administered 2024-04-08 – 2024-04-19 (×12): 40 mg via SUBCUTANEOUS
  Filled 2024-04-07 (×12): qty 0.4

## 2024-04-07 MED ORDER — METFORMIN HCL 500 MG PO TABS
500.0000 mg | ORAL_TABLET | Freq: Every day | ORAL | Status: DC
  Administered 2024-04-08 – 2024-04-19 (×12): 500 mg via ORAL
  Filled 2024-04-07 (×12): qty 1

## 2024-04-07 MED ORDER — THIAMINE HCL 100 MG/ML IJ SOLN
500.0000 mg | Freq: Three times a day (TID) | INTRAVENOUS | Status: AC
  Administered 2024-04-07 – 2024-04-09 (×5): 500 mg via INTRAVENOUS
  Filled 2024-04-07: qty 5
  Filled 2024-04-07: qty 500
  Filled 2024-04-07 (×2): qty 5
  Filled 2024-04-07: qty 466.67
  Filled 2024-04-07: qty 5
  Filled 2024-04-07: qty 500

## 2024-04-07 MED ORDER — ACETAMINOPHEN 650 MG RE SUPP: 650.0000 mg | Freq: Four times a day (QID) | RECTAL | Status: DC | PRN

## 2024-04-07 MED ORDER — LORAZEPAM 2 MG/ML IJ SOLN: 1.0000 mg | Freq: Once | INTRAMUSCULAR | Status: DC | PRN

## 2024-04-07 MED ORDER — ONDANSETRON HCL 4 MG PO TABS: 4.0000 mg | ORAL_TABLET | Freq: Four times a day (QID) | ORAL | Status: DC | PRN

## 2024-04-07 MED ORDER — SENNOSIDES-DOCUSATE SODIUM 8.6-50 MG PO TABS: 1.0000 | ORAL_TABLET | Freq: Every evening | ORAL | Status: DC | PRN

## 2024-04-07 MED ORDER — THIAMINE HCL 100 MG/ML IJ SOLN: 100.0000 mg | Freq: Every day | INTRAMUSCULAR | Status: DC

## 2024-04-07 MED ORDER — INSULIN ASPART 100 UNIT/ML IJ SOLN
0.0000 [IU] | Freq: Three times a day (TID) | INTRAMUSCULAR | Status: DC
  Administered 2024-04-08: 1 [IU] via SUBCUTANEOUS
  Administered 2024-04-09 (×2): 2 [IU] via SUBCUTANEOUS
  Administered 2024-04-09 – 2024-04-10 (×2): 1 [IU] via SUBCUTANEOUS
  Administered 2024-04-10 – 2024-04-11 (×2): 2 [IU] via SUBCUTANEOUS
  Administered 2024-04-11 – 2024-04-16 (×4): 1 [IU] via SUBCUTANEOUS
  Administered 2024-04-17: 2 [IU] via SUBCUTANEOUS
  Administered 2024-04-17 – 2024-04-18 (×4): 1 [IU] via SUBCUTANEOUS
  Administered 2024-04-19: 2 [IU] via SUBCUTANEOUS
  Filled 2024-04-07: qty 1
  Filled 2024-04-07: qty 2
  Filled 2024-04-07 (×3): qty 1
  Filled 2024-04-07: qty 2
  Filled 2024-04-07: qty 5
  Filled 2024-04-07: qty 2
  Filled 2024-04-07: qty 1
  Filled 2024-04-07 (×4): qty 2
  Filled 2024-04-07: qty 1
  Filled 2024-04-07: qty 2
  Filled 2024-04-07: qty 1
  Filled 2024-04-07: qty 5
  Filled 2024-04-07: qty 2

## 2024-04-07 MED ORDER — INSULIN ASPART 100 UNIT/ML IJ SOLN: 0.0000 [IU] | Freq: Every day | INTRAMUSCULAR | Status: DC

## 2024-04-07 MED ORDER — ENSURE PLUS HIGH PROTEIN PO LIQD
237.0000 mL | Freq: Two times a day (BID) | ORAL | Status: DC
  Administered 2024-04-08 – 2024-04-19 (×11): 237 mL via ORAL
  Filled 2024-04-07 (×2): qty 237

## 2024-04-07 MED ORDER — BISACODYL 5 MG PO TBEC
5.0000 mg | DELAYED_RELEASE_TABLET | Freq: Every day | ORAL | Status: DC | PRN
  Administered 2024-04-18: 5 mg via ORAL
  Filled 2024-04-07: qty 1

## 2024-04-07 MED ORDER — SODIUM CHLORIDE 0.9 % IV BOLUS
1000.0000 mL | Freq: Once | INTRAVENOUS | Status: AC
  Administered 2024-04-07: 1000 mL via INTRAVENOUS

## 2024-04-07 MED ORDER — ONDANSETRON HCL 4 MG/2ML IJ SOLN
4.0000 mg | Freq: Four times a day (QID) | INTRAMUSCULAR | Status: DC | PRN
Start: 2024-04-07 — End: 2024-04-19

## 2024-04-07 MED ORDER — ATORVASTATIN CALCIUM 80 MG PO TABS
80.0000 mg | ORAL_TABLET | Freq: Every day | ORAL | Status: DC
  Administered 2024-04-08 – 2024-04-19 (×12): 80 mg via ORAL
  Filled 2024-04-07 (×11): qty 1
  Filled 2024-04-07: qty 2

## 2024-04-07 MED ORDER — ACETAMINOPHEN 325 MG PO TABS
650.0000 mg | ORAL_TABLET | Freq: Four times a day (QID) | ORAL | Status: DC | PRN
  Administered 2024-04-08 – 2024-04-19 (×5): 650 mg via ORAL
  Filled 2024-04-07 (×5): qty 2

## 2024-04-07 NOTE — ED Triage Notes (Signed)
 BIBA from [facility] for increasing confusion, weakness over the 3 weeks that he has been there. Pt is A&O x 2 152/90 bp 106 hr 96% r/a 111 cbg 97.7 t

## 2024-04-07 NOTE — ED Provider Notes (Signed)
 [place] [facility] DEPARTMENT AT [facility] Provider Note   CSN: [id number] Arrival date & time: 04/07/24  1856     Patient presents with: Weakness   Kenneth Joseph is a 68 y.o. male history of diabetes, alcohol abuse, who presented with confusion and weakness.  Patient is from [facility] nursing home currently and has been there for the last 3 weeks.  Family called EMS because they were concerned of his mental status.  Per the facility, patient has been confused and no change in mental status for the last several weeks.  Patient is unable to give me much history.  He did state that he may have a fall 2 days ago.  Of note, patient was recently admitted to the hospital for colitis and finished a course of antibiotics.  Patient also had confusion at that time and was thought to be associated with chronic alcohol use.   The history is provided by the patient.       Prior to Admission medications   Medication Sig Start Date End Date Taking? Authorizing Provider  atorvastatin (LIPITOR) 80 MG tablet Take 80 mg by mouth in the morning.    [provider]  feeding supplement (ENSURE PLUS HIGH PROTEIN) LIQD Take 237 mLs by mouth 2 (two) times daily between meals. 03/21/24   Rashid, Farhan, MD  folic acid (FOLVITE) 1 MG tablet Take 1 tablet (1 mg total) by mouth daily. 03/22/24   Rashid, Farhan, MD  glipiZIDE-metformin (METAGLIP) 5-500 MG tablet Take 1 tablet by mouth in the morning. Take one tablet by mouth in the morning. 07/15/21   [provider]  HYDROcodone -acetaminophen  (NORCO/VICODIN) 5-325 MG tablet Take 1 tablet by mouth every 6 (six) hours as needed for severe pain (pain score 7-10). 03/21/24   Rashid, Farhan, MD  Multiple Vitamin (MULTIVITAMIN WITH MINERALS) TABS tablet Take 1 tablet by mouth daily. 03/22/24   Rashid, Farhan, MD  polyethylene glycol (MIRALAX / GLYCOLAX) 17 g packet Take 17 g by mouth daily as needed for moderate constipation. 03/21/24    Rashid, Farhan, MD  pregabalin (LYRICA) 150 MG capsule Take 1 capsule (150 mg total) by mouth 2 (two) times daily. 03/21/24   Rashid, Farhan, MD  thiamine (VITAMIN B-1) 100 MG tablet Take 1 tablet (100 mg total) by mouth daily. 03/22/24   Dino Antu, MD    Allergies: Colchicine    Review of Systems  Neurological:  Positive for weakness.  Psychiatric/Behavioral:  Positive for confusion.   All other systems reviewed and are negative.   Updated Vital Signs BP 127/89 (BP Location: Left Arm)   Pulse (!) 107   Temp 99.3 F (37.4 C) (Oral)   Resp 19   SpO2 98%   Physical Exam Vitals and nursing note reviewed.  Constitutional:      Comments: Chronically ill  HENT:     Head: Normocephalic.     Nose: Nose normal.     Mouth/Throat:     Mouth: Mucous membranes are dry.  Eyes:     Extraocular Movements: Extraocular movements intact.     Pupils: Pupils are equal, round, and reactive to light.  Cardiovascular:     Rate and Rhythm: Normal rate and regular rhythm.     Pulses: Normal pulses.     Heart sounds: Normal heart sounds.  Pulmonary:     Effort: Pulmonary effort is normal.     Breath sounds: Normal breath sounds.  Abdominal:     General: Abdomen is flat.  Palpations: Abdomen is soft.  Musculoskeletal:        General: Normal range of motion.     Cervical back: Normal range of motion and neck supple.  Skin:    General: Skin is warm.     Capillary Refill: Capillary refill takes less than 2 seconds.  Neurological:     Mental Status: He is alert.     Comments: ANO x 1.  Patient has some baseline tremors.  Patient has normal strength and sensation bilateral arms and legs  Psychiatric:        Mood and Affect: Mood normal.        Behavior: Behavior normal.     (all labs ordered are listed, but only abnormal results are displayed) Labs Reviewed  CBC WITH DIFFERENTIAL/PLATELET  COMPREHENSIVE METABOLIC PANEL WITH GFR  VITAMIN B1  ETHANOL  AMMONIA  URINALYSIS,  ROUTINE W REFLEX MICROSCOPIC  CBG MONITORING, ED    EKG: None  Radiology: No results found.   Procedures   Medications Ordered in the ED  sodium chloride  0.9 % bolus 1,000 mL (has no administration in time range)                                    Medical Decision Making Kenneth Joseph is a 68 y.o. male here presenting with confusion and weakness.  Patient has been at [facility] for the last 3 weeks.  Consider head bleed given recent fall versus pneumonia versus UTI versus electrolyte abnormality.  Plan to get CBC and CMP and UA and CT head and chest x-ray.  Will hydrate and reassess  8:47 PM Labs and CT head and urinalysis and ammonia levels negative.  Discussed with sister and daughter.  They state that they visited him almost every day.  They state that the cognitive decline is very rapid over the last 3 days.  He suddenly developed tremors and also is extremely confused.  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  Discussed with Dr. Vanessa from neurology.  He recommend B1 level and load with IV thiamine and MRI brain with and without contrast.  Recommend transfer to [facility] and neurology to see the patient.  Problems Addressed: Altered mental status, unspecified altered mental status type: acute illness or injury  Amount and/or Complexity of Data Reviewed Labs: ordered. Decision-making details documented in ED Course. Radiology: ordered and independent interpretation performed. Decision-making details documented in ED Course. ECG/medicine tests: ordered and independent interpretation performed. Decision-making details documented in ED Course.  Risk Prescription drug management.   Final diagnoses:  None    ED Discharge Orders     None          Patt Alm Macho, MD 04/07/24 2107

## 2024-04-07 NOTE — H&P (Signed)
 History and Physical  Kenneth Joseph FMW:[id number] DOB: December 16, 1955 DOA: 04/07/2024  PCP: Claudene Pellet, MD   Chief Complaint: Altered mental status  HPI: Kenneth Joseph is a 68 y.o. male with medical history significant for T2DM, HTN, HLD, vitamin B12 deficiency, gout, alcohoL use disorder currently in remission and peripheral polyneuropathy who presented from SNF for evaluation of altered mental status. On chart review, patient recently admitted at [facility] for colitis and discharged to SNF for rehab. Collateral obtained from patient's daughter and sister over the phone. They report patient was initially able to hold conversations however he has had gradual change in his mental status over the last week with difficulty with comprehension and holding conversations.  Yesterday, he was unable to remember the passcode for his phone. Over the last few days, he has also had urinary incontinence and has had some difficulty with ambulation after initial improvement with his strength and ambulation in the first week at the rehab facility. States he has had poor appetite with decreased p.o. intake over the last week. Today, they noticed that patient's mental status was significantly worse than baseline so they recommended EMS transport to the ER for evaluation. At the bedside, patient endorsed neuropathic pain in his lower extremities. He is able to tell me his full name, DOB but unable to tell me the current year, month or president. State he is currently at [facility].  Patient endorses some chills but denies any fevers, nausea, vomiting, abdominal pain, shortness of breath or dysuria.  ED Course: Initial vitals show temp 99.3, HR 90-100, SBP 120-130s. Initial labs significant for K+ 5.2, WBC 9.4, Hgb 12.3, normal ammonia levels, normal renal function and LFTs, normal ethanol levels and UA with no signs of infection. EKG shows sinus tach. CXR shows no active disease. CT head with no acute intracranial abnormality.  Neurology was consulted for evaluation. Pt received IV NS 1 L bolus and initiated on IV thiamine infusion. TRH was consulted for admission.   Review of Systems: Please see HPI for pertinent positives and negatives. A complete 10 system review of systems are otherwise negative.  Past Medical History:  Diagnosis Date   Anemia    Arthritis    Diabetes mellitus without complication (HCC)    type 2 per patient   Hepatitis C 2001   History of hiatal hernia    History of kidney stones    Hypertension    Past Surgical History:  Procedure Laterality Date   ANTERIOR CERVICAL DECOMP/DISCECTOMY FUSION N/A 10/21/2021   Procedure: ANTERIOR CERVICAL DECOMPRESSION FUSION CERVICAL FOUR- CERVICAL FIVE WITH INSTRUMENTATION AND ALLOGRAFT;  Surgeon: Beuford Anes, MD;  Location: [facility] OR;  Service: Orthopedics;  Laterality: N/A;   EYE SURGERY     bilateral cataract removal   Social History:  reports that he has never smoked. He has never used smokeless tobacco. He reports current alcohol use. He reports that he does not use drugs.  Allergies  Allergen Reactions   Colchicine Diarrhea and Nausea And Vomiting    Family History  Problem Relation Age of Onset   Hypertension Mother    Mental illness Mother    Diabetes Father    Heart disease Father    Hypertension Father    Stroke Father      Prior to Admission medications   Medication Sig Start Date End Date Taking? Authorizing Provider  atorvastatin (LIPITOR) 80 MG tablet Take 80 mg by mouth in the morning.    [provider]  feeding supplement (ENSURE  PLUS HIGH PROTEIN) LIQD Take 237 mLs by mouth 2 (two) times daily between meals. 03/21/24   Rashid, Farhan, MD  folic acid (FOLVITE) 1 MG tablet Take 1 tablet (1 mg total) by mouth daily. 03/22/24   Rashid, Farhan, MD  glipiZIDE-metformin (METAGLIP) 5-500 MG tablet Take 1 tablet by mouth in the morning. Take one tablet by mouth in the morning. 07/15/21   [provider]   HYDROcodone -acetaminophen  (NORCO/VICODIN) 5-325 MG tablet Take 1 tablet by mouth every 6 (six) hours as needed for severe pain (pain score 7-10). 03/21/24   Rashid, Farhan, MD  Multiple Vitamin (MULTIVITAMIN WITH MINERALS) TABS tablet Take 1 tablet by mouth daily. 03/22/24   Rashid, Farhan, MD  polyethylene glycol (MIRALAX / GLYCOLAX) 17 g packet Take 17 g by mouth daily as needed for moderate constipation. 03/21/24   Rashid, Farhan, MD  pregabalin (LYRICA) 150 MG capsule Take 1 capsule (150 mg total) by mouth 2 (two) times daily. 03/21/24   Rashid, Farhan, MD  thiamine (VITAMIN B-1) 100 MG tablet Take 1 tablet (100 mg total) by mouth daily. 03/22/24   Dino Antu, MD    Physical Exam: BP 127/89 (BP Location: Left Arm)   Pulse (!) 107   Temp 99.3 F (37.4 C) (Oral)   Resp 19   SpO2 98%  General: Pleasant, elderly man laying in bed. No acute distress. HEENT: [place]/AT. Anicteric sclera. PERRLA. Mild vertical nystagmus. CV: Mild tachycardia. Regular rhythm. No murmurs, rubs, or gallops. No LE edema Pulmonary: Lungs CTAB. Normal effort. No wheezing or rales. Abdominal: Soft, nontender, nondistended. Normal bowel sounds. Extremities: Palpable radial and DP pulses. Normal ROM. Skin: Warm and dry. No obvious rash or lesions. Neuro: Alert and oriented to self and person, not to place, time or situation. Delayed thought process with some word finding difficulties. No facial asymmetry. No tremors. Slow finger-nose testing.  Moves all extremities. Normal sensation to light touch.  Psych: Normal mood and affect          Labs on Admission:  Basic Metabolic Panel: Recent Labs  Lab 04/07/24 1951  NA 138  K 5.2*  CL 100  CO2 28  GLUCOSE 107*  BUN 11  CREATININE 0.97  CALCIUM 10.1   Liver Function Tests: Recent Labs  Lab 04/07/24 1951  AST 33  ALT 17  ALKPHOS 103  BILITOT 1.1  PROT 8.6*  ALBUMIN 3.4*   No results for input(s): LIPASE, AMYLASE in the last 168 hours. Recent  Labs  Lab 04/07/24 1951  AMMONIA 17   CBC: Recent Labs  Lab 04/07/24 1951  WBC 9.4  NEUTROABS 6.8  HGB 12.3*  HCT 39.0  MCV 101.6*  PLT 305   Cardiac Enzymes: No results for input(s): CKTOTAL, CKMB, CKMBINDEX, TROPONINI in the last 168 hours. BNP (last 3 results) No results for input(s): BNP in the last 8760 hours.  ProBNP (last 3 results) No results for input(s): PROBNP in the last 8760 hours.  CBG: Recent Labs  Lab 04/07/24 1905  GLUCAP 95    Radiological Exams on Admission: CT HEAD WO CONTRAST ( ) Result Date: 04/07/2024 EXAM: CT HEAD WITHOUT CONTRAST 04/07/2024 08:10:24 PM TECHNIQUE: CT of the head was performed without the administration of intravenous contrast. Automated exposure control, iterative reconstruction, and/or weight based adjustment of the mA/kV was utilized to reduce the radiation dose to as low as reasonably achievable. COMPARISON: CT head 11/22/2023. CLINICAL HISTORY: Mental status change, unknown cause. FINDINGS: BRAIN AND VENTRICLES: No acute hemorrhage. No evidence of acute  infarct. No hydrocephalus. No extra-axial collection. No mass effect or midline shift. Atherosclerotic calcifications are present within the cavernous internal carotid arteries. Patchy and confluent areas of decreased attenuation are noted throughout the deep and periventricular white matter of the cerebral hemispheres bilaterally, suggestive of chronic microvascular ischemic changes. ORBITS: Bilateral lens replacement. SINUSES: No acute abnormality. SOFT TISSUES AND SKULL: No acute soft tissue abnormality. No skull fracture. IMPRESSION: 1. No acute intracranial abnormality. Electronically signed by: Morgane Naveau MD 04/07/2024 08:15 PM EST RP Workstation: [id number]   DG Chest Port 1 View Result Date: 04/07/2024 EXAM: 1 VIEW(S) XRAY OF THE CHEST 04/07/2024 07:18:00 PM COMPARISON: Chest x-ray 03/18/2023. CLINICAL HISTORY: AMS. FINDINGS: LUNGS AND PLEURA: No focal pulmonary  opacity. No pulmonary edema. No pleural effusion. No pneumothorax. HEART AND MEDIASTINUM: No acute abnormality of the cardiac and mediastinal silhouettes. BONES AND SOFT TISSUES: No acute osseous abnormality. IMPRESSION: 1. No acute proces Electronically signed by: Greig Pique MD 04/07/2024 07:25 PM EST RP Workstation: [id number]   Assessment/Plan Harshil Cavallaro is a 68 y.o. male with medical history significant for T2DM, HTN, HLD, vitamin B12 deficiency, gout, alcohoL use disorder currently in remission and peripheral polyneuropathy who presented from SNF for evaluation of altered mental status and admitted for acute encephalopathy.  # Acute encephalopathy # Wernicke encephalopathy - Patient with history of EtOH abuse presented with acute onset of mental status change with associated ambulatory difficulties - An infectious cause ruled out with normal urinalysis and chest x-ray - Patient with normal ammonia, ethanol and LFTs, no signs of uremia - Concern for possible Wernicke's encephalopathy in the setting of his EtOH history, presenting cognitive and ambulatory issues and neuroexam - Neurology consulted, recommend initiating treatment for Wernicke's encephalopathy - Admit to telemetry floor at [facility] - Continue IV thiamine taper - Follow-up MRI brain and thiamine level - Delirium precautions  # Alcohol use disorder - Last EtOH use 3 weeks ago prior to last hospitalization - Normal ethanol levels on admission - Continue folate, multivitamin and thiamine  # T2DM, well-controlled - Last A1c 5.5% 3 weeks ago - Continue metformin and discontinue glipizide to decrease risk of hypoglycemia - SSI with meals, CBG monitoring  # HTN - BP stable with SBP in the 120s to 130s  # Peripheral polyneuropathy - Continue Lyrica  # Folic acid deficiency - Folic acid 3.1 3 weeks ago during last hospitalization - Continue folate supplementation  # Vitamin B12 deficiency, resolved - Pending B12  levels improved to 982 3 weeks ago  # HLD - Continue atorvastatin  # 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 intake in the setting of mental status change - PT/OT eval and treat - Check mag, Phos and TSH - Fall precautions  DVT prophylaxis: Lovenox     Code Status: Full Code  Consults called: Neurology  Family Communication: Discussed admission with daughter and sister over the phone  Severity of Illness: The appropriate patient status for this patient is INPATIENT. Inpatient status is judged to be reasonable and necessary in order to provide the required intensity of service to ensure the patient's safety. The patient's presenting symptoms, physical exam findings, and initial radiographic and laboratory data in the context of their chronic comorbidities is felt to place them at high risk for further clinical deterioration. Furthermore, it is not anticipated that the patient will be  medically stable for discharge from the hospital within 2 midnights of admission.   * I certify that at the point of admission it is my clinical judgment that the patient will require inpatient hospital care spanning beyond 2 midnights from the point of admission due to high intensity of service, high risk for further deterioration and high frequency of surveillance required.*  Level of care: Telemetry   I personally spent a total of 75 minutes in the care of the patient today including preparing to see the patient, getting/reviewing separately obtained history, performing a medically appropriate exam/evaluation, placing orders, and documenting clinical information in the EHR.   Lou Claretta HERO, MD 04/07/2024, 10:38 PM [facility] Pager: [phone] Isaiah 41:10   If 7PM-7AM, please contact  night-coverage [email or web address] Password [email or web address]

## 2024-04-08 ENCOUNTER — Inpatient Hospital Stay (HOSPITAL_COMMUNITY)

## 2024-04-08 DIAGNOSIS — R4182 Altered mental status, unspecified: Secondary | ICD-10-CM | POA: Diagnosis not present

## 2024-04-08 DIAGNOSIS — R410 Disorientation, unspecified: Secondary | ICD-10-CM

## 2024-04-08 DIAGNOSIS — Z8669 Personal history of other diseases of the nervous system and sense organs: Secondary | ICD-10-CM | POA: Diagnosis not present

## 2024-04-08 DIAGNOSIS — G9340 Encephalopathy, unspecified: Secondary | ICD-10-CM | POA: Diagnosis not present

## 2024-04-08 DIAGNOSIS — F1091 Alcohol use, unspecified, in remission: Secondary | ICD-10-CM | POA: Diagnosis not present

## 2024-04-08 DIAGNOSIS — G621 Alcoholic polyneuropathy: Secondary | ICD-10-CM | POA: Diagnosis not present

## 2024-04-08 LAB — BASIC METABOLIC PANEL WITH GFR
Anion gap: 11 (ref 5–15)
BUN: 12 mg/dL (ref 8–23)
CO2: 26 mmol/L (ref 22–32)
Calcium: 9.6 mg/dL (ref 8.9–10.3)
Chloride: 101 mmol/L (ref 98–111)
Creatinine, Ser: 0.92 mg/dL (ref 0.61–1.24)
GFR, Estimated: >60 mL/min (ref >60)
Glucose, Bld: 116 mg/dL — ABNORMAL HIGH (ref 70–99)
Potassium: 4.6 mmol/L (ref 3.5–5.1)
Sodium: 138 mmol/L (ref 135–145)

## 2024-04-08 LAB — CBC
HCT: 36.6 % — ABNORMAL LOW (ref 39.0–52.0)
Hemoglobin: 11.4 g/dL — ABNORMAL LOW (ref 13.0–17.0)
MCH: 31.8 pg (ref 26.0–34.0)
MCHC: 31.1 g/dL (ref 30.0–36.0)
MCV: 102.2 fL — ABNORMAL HIGH (ref 80.0–100.0)
Platelets: 291 K/uL (ref 150–400)
RBC: 3.58 MIL/uL — ABNORMAL LOW (ref 4.22–5.81)
RDW: 15.6 % — ABNORMAL HIGH (ref 11.5–15.5)
WBC: 8.9 K/uL (ref 4.0–10.5)
nRBC: 0 % (ref 0.0–0.2)

## 2024-04-08 LAB — PHOSPHORUS: Phosphorus: 3.5 mg/dL (ref 2.5–4.6)

## 2024-04-08 LAB — CBG MONITORING, ED
Glucose-Capillary: 112 mg/dL — ABNORMAL HIGH (ref 70–99)
Glucose-Capillary: 115 mg/dL — ABNORMAL HIGH (ref 70–99)
Glucose-Capillary: 103 mg/dL — ABNORMAL HIGH (ref 70–99)

## 2024-04-08 LAB — TSH: TSH: 1.82 u[IU]/mL (ref 0.350–4.500)

## 2024-04-08 LAB — MAGNESIUM: Magnesium: 1.6 mg/dL — ABNORMAL LOW (ref 1.7–2.4)

## 2024-04-08 LAB — GLUCOSE, CAPILLARY
Glucose-Capillary: 132 mg/dL — ABNORMAL HIGH (ref 70–99)
Glucose-Capillary: 145 mg/dL — ABNORMAL HIGH (ref 70–99)

## 2024-04-08 LAB — FOLATE: Folate: >20 ng/mL (ref >5.9)

## 2024-04-08 MED ORDER — GADOBUTROL 1 MMOL/ML IV SOLN
7.5000 mL | Freq: Once | INTRAVENOUS | Status: AC | PRN
  Administered 2024-04-08: 7.5 mL via INTRAVENOUS

## 2024-04-08 MED ORDER — PREGABALIN 25 MG PO CAPS
50.0000 mg | ORAL_CAPSULE | Freq: Two times a day (BID) | ORAL | Status: DC
  Administered 2024-04-08 – 2024-04-19 (×22): 50 mg via ORAL
  Filled 2024-04-08: qty 2
  Filled 2024-04-08: qty 1
  Filled 2024-04-08 (×20): qty 2

## 2024-04-08 NOTE — ED Notes (Signed)
 [facility] called.

## 2024-04-08 NOTE — Progress Notes (Signed)
 PT Cancellation Note  Patient Details Name: Kenneth Joseph MRN: [id number] DOB: 1955/12/23   Cancelled Treatment:    Reason Eval/Treat Not Completed: Other (comment). Nurse reports [facility] has been [facility] to transport pt to [facility]. Nurse also made PT aware pt is not currently able to follow one step commands currently. PT to continue to follow acutely.   [place], PT Acute Rehab   [place] VEAR Drone 04/08/2024, 1:22 PM

## 2024-04-08 NOTE — Consult Note (Signed)
 NEUROLOGY CONSULT NOTE   Date of service: April 08, 2024 Patient Name: Kenneth Joseph MRN:  [id number] DOB:  12-Feb-1956 Chief Complaint: AMS  Requesting Provider: Kathrin Mignon DASEN, MD  History of Present Illness  Kashius Dominic is a 68 y.o. male with hx of DM , hiatal hernia, HTN, vitamin B 12 deficiency, alcohol use, peripheral neuropathy, gout who presented from his SNF for AMS. Wife and sister is at the bedside. Sister states prior to his last hospitalization he was at home with his son but essentially at home by himself. Sister endorses prior to hospitalization he was having some cognitive issues with not remembering things or repeating himself. He was also a heavier drinker drinking 1/2 pint of fireball daily, he also states that prior to his last hospitalization he had cut his drinking down to less than 1/2 pint/day. He went to rehab and was doing well for the first week and then had a decline in mental status while at his SNF. He was not eating well and was lethargic and was not really working with therapy. His wife lives in [place] and came to visit on Thursday and she states even since Thursday he had an even more decline in his mental status so he was sent to hospital for evaluation. Wife states that up til this weekend he had been managing his own finances. Wife and sister both endorse that he is much improved today. Mri brain with no acute process. Patient  was also started on high dose thiamine. Neurology consulted.   ROS  Comprehensive ROS performed and pertinent positives documented in HPI    Past History   Past Medical History:  Diagnosis Date   Anemia    Arthritis    Diabetes mellitus without complication (HCC)    type 2 per patient   Hepatitis C 2001   History of hiatal hernia    History of kidney stones    Hypertension     Past Surgical History:  Procedure Laterality Date   ANTERIOR CERVICAL DECOMP/DISCECTOMY FUSION N/A 10/21/2021   Procedure: ANTERIOR CERVICAL  DECOMPRESSION FUSION CERVICAL FOUR- CERVICAL FIVE WITH INSTRUMENTATION AND ALLOGRAFT;  Surgeon: Beuford Anes, MD;  Location: [facility] OR;  Service: Orthopedics;  Laterality: N/A;   EYE SURGERY     bilateral cataract removal    Family History: Family History  Problem Relation Age of Onset   Hypertension Mother    Mental illness Mother    Diabetes Father    Heart disease Father    Hypertension Father    Stroke Father     Social History  reports that he has never smoked. He has never used smokeless tobacco. He reports current alcohol use. He reports that he does not use drugs.  Allergies  Allergen Reactions   Colchicine Diarrhea and Nausea And Vomiting    Medications   Current Facility-Administered Medications:    0.9 %  sodium chloride  infusion, , Intravenous, Continuous, Amponsah, Claretta HERO, MD, Paused at 04/08/24 1237   acetaminophen  (TYLENOL ) tablet 650 mg, 650 mg, Oral, Q6H PRN, 650 mg at 04/08/24 1549 **OR** acetaminophen  (TYLENOL ) suppository 650 mg, 650 mg, Rectal, Q6H PRN, Lou Claretta HERO, MD   atorvastatin (LIPITOR) tablet 80 mg, 80 mg, Oral, Daily, Amponsah, Prosper M, MD, 80 mg at 04/08/24 9062   bisacodyl (DULCOLAX) EC tablet 5 mg, 5 mg, Oral, Daily PRN, Amponsah, Prosper M, MD   enoxaparin (LOVENOX) injection 40 mg, 40 mg, Subcutaneous, Q24H, Lou Claretta HERO, MD, 40 mg at 04/08/24 [phone]  feeding supplement (ENSURE PLUS HIGH PROTEIN) liquid 237 mL, 237 mL, Oral, BID BM, Lou Claretta HERO, MD, 237 mL at 04/08/24 1443   folic acid (FOLVITE) tablet 1 mg, 1 mg, Oral, Daily, Amponsah, Prosper M, MD, 1 mg at 04/08/24 9062   insulin  aspart (novoLOG ) injection 0-5 Units, 0-5 Units, Subcutaneous, QHS, Amponsah, Prosper M, MD   insulin  aspart (novoLOG ) injection 0-9 Units, 0-9 Units, Subcutaneous, TID WC, Amponsah, Prosper M, MD   LORazepam (ATIVAN) injection 1 mg, 1 mg, Intravenous, Once PRN, Patt Alm Macho, MD   metFORMIN (GLUCOPHAGE) tablet 500 mg, 500 mg, Oral, Q  breakfast, Lou Claretta HERO, MD, 500 mg at 04/08/24 9062   multivitamin with minerals tablet 1 tablet, 1 tablet, Oral, Daily, Lou Claretta HERO, MD, 1 tablet at 04/08/24 9062   ondansetron  (ZOFRAN ) tablet 4 mg, 4 mg, Oral, Q6H PRN **OR** ondansetron  (ZOFRAN ) injection 4 mg, 4 mg, Intravenous, Q6H PRN, Lou, Claretta HERO, MD   pregabalin (LYRICA) capsule 50 mg, 50 mg, Oral, BID, Gonfa, Taye T, MD, 50 mg at 04/08/24 9062   senna-docusate (Senokot-S) tablet 1 tablet, 1 tablet, Oral, QHS PRN, Lou Claretta HERO, MD   thiamine (VITAMIN B1) 500 mg in sodium chloride  0.9 % 50 mL IVPB, 500 mg, Intravenous, Q8H, Stopped at 04/08/24 0704 **FOLLOWED BY** [START ON 04/10/2024] thiamine (VITAMIN B1) 250 mg in sodium chloride  0.9 % 50 mL IVPB, 250 mg, Intravenous, Daily **FOLLOWED BY** [START ON 04/16/2024] thiamine (VITAMIN B1) injection 100 mg, 100 mg, Intravenous, Daily, Patt Alm Macho, MD  Vitals   Vitals:   04/08/24 0913 04/08/24 1241 04/08/24 1413 04/08/24 1558  BP: 134/86 (!) 126/91 119/86 128/76  Pulse: 85 88 90 95  Resp: 16 20 18 18   Temp:  98.5 F (36.9 C) 98.2 F (36.8 C) 98.5 F (36.9 C)  TempSrc:  Oral Oral   SpO2: 99% 92%  99%    There is no height or weight on file to calculate BMI.   Physical Exam   Constitutional: Appears well-developed and well-nourished.  Psych: Affect appropriate to situation.  Eyes: No scleral injection.  HENT: No OP obstruction.  Head: Normocephalic.  Cardiovascular: Normal rate and regular rhythm.  Respiratory: Effort normal, non-labored breathing.  GI: Soft.  No distension. There is no tenderness.  Skin: WDI.   Neurologic Examination   Mental Status -  He is awake and alert, oriented to self, age and month, president of the [place] . He was able to give a history   Cranial Nerves II - XII - II - Visual field intact OU . III, IV, VI - Extraocular movements intact . V - Facial sensation intact bilaterally . VII - Facial movement intact  bilaterally . VIII - Hearing & vestibular intact bilaterally . X - Palate elevates symmetrically . XI - Chin turning & shoulder shrug intact bilaterally . XII - Tongue protrusion intact .  Motor Strength - 5/5 in bilateral uppers, bilateral lowers 4-/5 Motor Tone - Muscle tone was assessed at the neck and appendages and was normal . Sensory - decreased in bilateral lowers to knees which he states is normal due to his neuropathy  Coordination - mild tremors of both uppers.   Gait and Station - deferred.  Labs/Imaging/Neurodiagnostic studies   CBC:  Recent Labs  Lab 04/21/2024 1951 04/08/24 0500  WBC 9.4 8.9  NEUTROABS 6.8  --   HGB 12.3* 11.4*  HCT 39.0 36.6*  MCV 101.6* 102.2*  PLT 305 291   Basic Metabolic Panel:  Lab Results  Component Value Date   NA 138 04/08/2024   K 4.6 04/08/2024   CO2 26 04/08/2024   GLUCOSE 116 (H) 04/08/2024   BUN 12 04/08/2024   CREATININE 0.92 04/08/2024   CALCIUM 9.6 04/08/2024   GFRNONAA >60 04/08/2024   GFRAA >60 08/22/2017   HgbA1c:  Lab Results  Component Value Date   HGBA1C 5.5 03/14/2024   Alcohol Level     Component Value Date/Time   [facility] <15 04/07/2024 1951     CT Head without contrast(Personally reviewed): No acute process  MRI Brain(Personally reviewed): No acute process   TSH 1.820 Ammonia 17 ETOH negative  UA negative Thiamine pending, thiamine on 10/16 124  B12 982 on 10/17 Folate 3.1 on 10/16 RPR is negative  ASSESSMENT   Tiger Spieker is a 68 y.o. male DM , hiatal hernia, HTN, vitamin B 12 deficiency, alcohol use, peripheral neuropathy, gout who presented from his SNF for AMS.  With his history of alcoholism, and rapid improvement after starting high-dose thiamine, I had suspected possibly that this could be thiamine deficiency, but he did have a normal thiamine level on 10/16 making this less likely.  That being said, we continue it for now.  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  He does have folate deficiency which can certainly contribute to neuropathy.  It is possible that this just represents delirium in the setting of being in an unusual environment, prolonged hospitalization, recent gram-negative infection, etc.  The fact that he is improving is reassuring.  RECOMMENDATIONS  - continue high dose thiamine - Continue folate, check level - PT/OT as able  - Delirium precautions  ______________________________________________________________________  Signed, Karna DELENA Geralds, NP [facility]  I have seen the patient and edited the above note.  He has slowed finger-nose-finger bilaterally, but without frank dysmetria.  It is certainly possible that he has a multifocal gait disturbance given his history of myelopathy coupled with alcoholic neuropathy.  Neurology will follow  Aisha Seals, MD [facility]   If 7pm- 7am, please page neurology on call as listed in AMION.

## 2024-04-08 NOTE — Progress Notes (Signed)
 PROGRESS NOTE  Kenneth Joseph FMW:[id number] DOB: [birth year]   PCP: Claudene Pellet, MD  Patient is from: Nursing home  DOA: 04/07/2024 LOS: 1  Chief complaints Chief Complaint  Patient presents with   Weakness     Brief Narrative / Interim history: 68 year old M with PMH of prior EtOH use in remission, DM-2, HTN, B12 deficiency, gout and peripheral polyneuropathy brought to ED from SNF with altered mental status, and admitted for acute encephalopathy.  Per family patient was able to hold conversation but has gradually developed difficulty comprehending and holding conversation.  He was unable to remember his phones passcode.  Also had urinary incontinence, decreased p.o. intake and some difficulty with ambulation Chelle improvement with his strength during the first week of rehab.  In ED, stable vitals.  WBC 9.4.  K5.2.  EtOH level undetectable.  UA negative.  EKG sinus tachycardia.  CXR without acute finding.  CT head without acute finding..  Neurology consulted and recommended admission to [facility] for possible Warnicke encephalopathy.  Started on high-dose thiamine.  MRI brain ordered.  The next day, MRI brain with no acute intracranial abnormality.  Patient is awake and alert but only oriented to self and place.  Not oriented to time.  No complaints.  He thinks he is in the hospital for neuropathy.  Subjective: Seen and examined earlier this morning.  No major events overnight or this morning.  No complaints.  Denies pain, shortness of breath, GI or UTI symptoms.  He says he just urinated.  Denies dysuria.  He wears depends.   Assessment and plan: Acute encephalopathy: Multifactorial including polypharmacy (from Norco Lyrica), possible Warnicke (alcohol abuse), dehydration and delirium.  CT head and MRI brain without acute finding.  Ammonia, UA and TSH unrevealing.  EtOH level undetectable.  His B12 was 982 on 10/17.  He is thiamine level is 124 at the same time.  No focal  neurodeficit. -Continue high-dose thiamine per recommendation by neurology -Reorientation and delirium precaution. -Follow further neurology recommendation -Decrease Lyrica to 50 mg twice daily -Follow-up vitamin B1 level -Continue holding Norco -PT/OT eval   History of alcohol use disorder: Last EtOH use 3 weeks ago prior to last hospitalization last month.  EtOH level undetectable.  His thiamine level was normal last hospitalization. - Continue folate, multivitamin and thiamine  NIDDM-2: A1c 5.5% on 10/16. Recent Labs  Lab 04/07/24 1905 04/08/24 0119 04/08/24 0923  GLUCAP 95 112* 115*  - Continue metformin and discontinue glipizide to decrease risk of hypoglycemia - SSI with meals, CBG monitoring   Essential hypertension: Normotensive.  Does not seem to be medications.   Peripheral polyneuropathy: Likely due to alcohol abuse.  Takes Lyrica 150 mg twice daily - Decrease Lyrica to 50 mg twice daily in the setting of encephalopathy.   Folic acid deficiency: Folic acid was low at 3.1 about 3 weeks ago during last hospitalization - Continue folate supplementation  Macrocytic anemia: Likely due to alcohol and folate deficiency. - Continue folic acid supplementation   Generalized weakness: TSH normal. -PT/OT eval  Hyperlipidemia - Continue atorvastatin  Hyperkalemia: Resolved.  There is no height or weight on file to calculate BMI.          DVT prophylaxis:  enoxaparin (LOVENOX) injection 40 mg Start: 04/08/24 1000  Code Status: Full code.  Patient has MOST form at bedside. Family Communication: None at bedside Level of care: Telemetry Status is: Inpatient Remains inpatient appropriate because: Acute encephalopathy   Final disposition: Likely back to SNF  55 minutes with more than 50% spent in reviewing records, counseling patient/family and coordinating care.  Consultants:  Neurology  Procedures: None  Microbiology  summarized: None  Objective: Vitals:   04/08/24 0708 04/08/24 0727 04/08/24 0910 04/08/24 0913  BP:    134/86  Pulse:    85  Resp:    16  Temp:  98.4 F (36.9 C) 98.1 F (36.7 C)   TempSrc:  Oral Oral   SpO2: 100%   99%    Examination:  GENERAL: No apparent distress.  Nontoxic. HEENT: MMM.  Vision and hearing grossly intact.  NECK: Supple.  No apparent JVD.  RESP:  No IWOB.  Fair aeration bilaterally. CVS:  RRR. Heart sounds normal.  ABD/GI/GU: BS+. Abd soft, NTND.  MSK/EXT:  Moves extremities. No apparent deformity.  Trace pedal edema. SKIN: no apparent skin lesion or wound NEURO: AA.  Oriented to self and place but not time.  DRIL.  Clear speech.  No facial asymmetry.  No apparent focal neuro deficit.  Moves all extremities. PSYCH: Calm. Normal affect.   Sch Meds:  Scheduled Meds:  atorvastatin  80 mg Oral Daily   enoxaparin (LOVENOX) injection  40 mg Subcutaneous Q24H   feeding supplement  237 mL Oral BID BM   folic acid  1 mg Oral Daily   insulin  aspart  0-5 Units Subcutaneous QHS   insulin  aspart  0-9 Units Subcutaneous TID WC   metFORMIN  500 mg Oral Q breakfast   multivitamin with minerals  1 tablet Oral Daily   pregabalin  50 mg Oral BID   [START ON 04/16/2024] thiamine (VITAMIN B1) injection  100 mg Intravenous Daily   Continuous Infusions:  sodium chloride  100 mL/hr at 04/08/24 0937   thiamine (VITAMIN B1) injection Stopped (04/08/24 0704)   Followed by   NOREEN ON 04/10/2024] thiamine (VITAMIN B1) injection     PRN Meds:.acetaminophen  **OR** acetaminophen , bisacodyl, LORazepam, ondansetron  **OR** ondansetron  (ZOFRAN ) IV, senna-docusate  Antimicrobials: Anti-infectives (From admission, onward)    None        I have personally reviewed the following labs and images: CBC: Recent Labs  Lab 04/07/24 1951 04/08/24 0500  WBC 9.4 8.9  NEUTROABS 6.8  --   HGB 12.3* 11.4*  HCT 39.0 36.6*  MCV 101.6* 102.2*  PLT 305 291   BMP &GFR Recent Labs   Lab 04/07/24 1951 04/08/24 0500  NA 138 138  K 5.2* 4.6  CL 100 101  CO2 28 26  GLUCOSE 107* 116*  BUN 11 12  CREATININE 0.97 0.92  CALCIUM 10.1 9.6  MG  --  1.6*  PHOS  --  3.5   CrCl cannot be calculated (Unknown ideal weight.). Liver & Pancreas: Recent Labs  Lab 04/07/24 1951  AST 33  ALT 17  ALKPHOS 103  BILITOT 1.1  PROT 8.6*  ALBUMIN 3.4*   No results for input(s): LIPASE, AMYLASE in the last 168 hours. Recent Labs  Lab 04/07/24 1951  AMMONIA 17   Diabetic: No results for input(s): HGBA1C in the last 72 hours. Recent Labs  Lab 04/07/24 1905 04/08/24 0119 04/08/24 0923  GLUCAP 95 112* 115*   Cardiac Enzymes: No results for input(s): CKTOTAL, CKMB, CKMBINDEX, TROPONINI in the last 168 hours. No results for input(s): PROBNP in the last 8760 hours. Coagulation Profile: No results for input(s): INR, PROTIME in the last 168 hours. Thyroid  Function Tests: Recent Labs    04/08/24 0500  TSH 1.820   Lipid Profile: No results for input(s):  CHOL, HDL, LDLCALC, TRIG, CHOLHDL, LDLDIRECT in the last 72 hours. Anemia Panel: No results for input(s): VITAMINB12, FOLATE, FERRITIN, TIBC, IRON, RETICCTPCT in the last 72 hours. Urine analysis:    Component Value Date/Time   COLORURINE YELLOW 04/07/2024 1931   APPEARANCEUR CLEAR 04/07/2024 1931   LABSPEC 1.016 04/07/2024 1931   PHURINE 7.0 04/07/2024 1931   GLUCOSEU NEGATIVE 04/07/2024 1931   HGBUR SMALL (A) 04/07/2024 1931   BILIRUBINUR NEGATIVE 04/07/2024 1931   BILIRUBINUR negative 04/21/2015 1710   KETONESUR NEGATIVE 04/07/2024 1931   PROTEINUR NEGATIVE 04/07/2024 1931   UROBILINOGEN 0.2 04/21/2015 1710   NITRITE NEGATIVE 04/07/2024 1931   LEUKOCYTESUR NEGATIVE 04/07/2024 1931   Sepsis Labs: Invalid input(s): PROCALCITONIN, LACTICIDVEN  Microbiology: No results found for this or any previous visit (from the past 240 hours).  Radiology Studies: MR  Brain W and Wo Contrast Result Date: 04/08/2024 EXAM: MRI BRAIN WITH AND WITHOUT CONTRAST 04/08/2024 09:02:58 AM TECHNIQUE: Multiplanar multisequence MRI of the head/brain was performed with and without the administration of 7.5 mL of gadobutrol (GADAVIST) 1 MMOL/ML intravenous contrast. COMPARISON: Head CT 04/07/2024. CLINICAL HISTORY: 68 year old male. Weakness and confusion. FINDINGS: BRAIN AND VENTRICLES: No acute infarct. No cortical encephalomalacia or chronic cerebral blood products. Minimal to mild for age nonspecific cerebral white matter signal changes. No acute intracranial hemorrhage. No mass effect or midline shift. No hydrocephalus. The sella is unremarkable. Normal flow voids. Following contrast the major dural venous sinuses are enhancing and appear to be patent. No abnormal intracranial enhancement. No dural thickening. ORBITS: No acute abnormality. SINUSES: Mild paranasal sinus mucosal thickening and left maxillary retention cyst. Mastoids are clear. Grossly normal visible internal auditory structures. BONES AND SOFT TISSUES: Negative for age of visible cervical spine. Normal bone marrow signal and enhancement. No acute soft tissue abnormality. LIMITATIONS/ARTIFACTS: Significant bowel gas outflow. IMPRESSION: 1. No acute intracranial abnormality. Negative for age MRI appearance of the brain. Electronically signed by: Helayne Hurst MD 04/08/2024 09:54 AM EST RP Workstation: [id number]   CT HEAD WO CONTRAST ( ) Result Date: 04/07/2024 EXAM: CT HEAD WITHOUT CONTRAST 04/07/2024 08:10:24 PM TECHNIQUE: CT of the head was performed without the administration of intravenous contrast. Automated exposure control, iterative reconstruction, and/or weight based adjustment of the mA/kV was utilized to reduce the radiation dose to as low as reasonably achievable. COMPARISON: CT head 11/22/2023. CLINICAL HISTORY: Mental status change, unknown cause. FINDINGS: BRAIN AND VENTRICLES: No acute hemorrhage. No  evidence of acute infarct. No hydrocephalus. No extra-axial collection. No mass effect or midline shift. Atherosclerotic calcifications are present within the cavernous internal carotid arteries. Patchy and confluent areas of decreased attenuation are noted throughout the deep and periventricular white matter of the cerebral hemispheres bilaterally, suggestive of chronic microvascular ischemic changes. ORBITS: Bilateral lens replacement. SINUSES: No acute abnormality. SOFT TISSUES AND SKULL: No acute soft tissue abnormality. No skull fracture. IMPRESSION: 1. No acute intracranial abnormality. Electronically signed by: Morgane Naveau MD 04/07/2024 08:15 PM EST RP Workstation: [id number]   DG Chest Port 1 View Result Date: 04/07/2024 EXAM: 1 VIEW(S) XRAY OF THE CHEST 04/07/2024 07:18:00 PM COMPARISON: Chest x-ray 03/18/2023. CLINICAL HISTORY: AMS. FINDINGS: LUNGS AND PLEURA: No focal pulmonary opacity. No pulmonary edema. No pleural effusion. No pneumothorax. HEART AND MEDIASTINUM: No acute abnormality of the cardiac and mediastinal silhouettes. BONES AND SOFT TISSUES: No acute osseous abnormality. IMPRESSION: 1. No acute proces Electronically signed by: Greig Pique MD 04/07/2024 07:25 PM EST RP Workstation: [id number]      Ujbz T. Allure Greaser [facility]  If 7PM-7AM, please contact night-coverage [email or web address] 04/08/2024, 10:09 AM

## 2024-04-09 DIAGNOSIS — R410 Disorientation, unspecified: Secondary | ICD-10-CM | POA: Diagnosis not present

## 2024-04-09 DIAGNOSIS — G9340 Encephalopathy, unspecified: Secondary | ICD-10-CM | POA: Diagnosis not present

## 2024-04-09 DIAGNOSIS — F1091 Alcohol use, unspecified, in remission: Secondary | ICD-10-CM | POA: Diagnosis not present

## 2024-04-09 DIAGNOSIS — D539 Nutritional anemia, unspecified: Secondary | ICD-10-CM | POA: Diagnosis not present

## 2024-04-09 LAB — CBC
HCT: 36 % — ABNORMAL LOW (ref 39.0–52.0)
Hemoglobin: 11.7 g/dL — ABNORMAL LOW (ref 13.0–17.0)
MCH: 31.9 pg (ref 26.0–34.0)
MCHC: 32.5 g/dL (ref 30.0–36.0)
MCV: 98.1 fL (ref 80.0–100.0)
Platelets: 288 K/uL (ref 150–400)
RBC: 3.67 MIL/uL — ABNORMAL LOW (ref 4.22–5.81)
RDW: 15.7 % — ABNORMAL HIGH (ref 11.5–15.5)
WBC: 8.1 K/uL (ref 4.0–10.5)
nRBC: 0 % (ref 0.0–0.2)

## 2024-04-09 LAB — COMPREHENSIVE METABOLIC PANEL WITH GFR
ALT: 17 U/L (ref 0–44)
AST: 29 U/L (ref 15–41)
Albumin: 2.4 g/dL — ABNORMAL LOW (ref 3.5–5.0)
Alkaline Phosphatase: 84 U/L (ref 38–126)
Anion gap: 12 (ref 5–15)
BUN: 11 mg/dL (ref 8–23)
CO2: 24 mmol/L (ref 22–32)
Calcium: 9.2 mg/dL (ref 8.9–10.3)
Chloride: 100 mmol/L (ref 98–111)
Creatinine, Ser: 0.96 mg/dL (ref 0.61–1.24)
GFR, Estimated: >60 mL/min (ref >60)
Glucose, Bld: 142 mg/dL — ABNORMAL HIGH (ref 70–99)
Potassium: 4 mmol/L (ref 3.5–5.1)
Sodium: 136 mmol/L (ref 135–145)
Total Bilirubin: 0.8 mg/dL (ref 0.0–1.2)
Total Protein: 7.9 g/dL (ref 6.5–8.1)

## 2024-04-09 LAB — GLUCOSE, CAPILLARY
Glucose-Capillary: 153 mg/dL — ABNORMAL HIGH (ref 70–99)
Glucose-Capillary: 138 mg/dL — ABNORMAL HIGH (ref 70–99)
Glucose-Capillary: 162 mg/dL — ABNORMAL HIGH (ref 70–99)
Glucose-Capillary: 152 mg/dL — ABNORMAL HIGH (ref 70–99)

## 2024-04-09 LAB — MAGNESIUM: Magnesium: 1.5 mg/dL — ABNORMAL LOW (ref 1.7–2.4)

## 2024-04-09 MED ORDER — MAGNESIUM SULFATE 4 GM/100ML IV SOLN
4.0000 g | Freq: Once | INTRAVENOUS | Status: AC
  Administered 2024-04-09: 4 g via INTRAVENOUS
  Filled 2024-04-09: qty 100

## 2024-04-09 NOTE — Progress Notes (Signed)
 PROGRESS NOTE  Kenneth Joseph FMW:[id number] DOB: December 25, 1955   PCP: Claudene Pellet, MD  Patient is from: Nursing home  DOA: 04/07/2024 LOS: 2  Brief Narrative / Interim history: 68 year old M with PMH of prior EtOH use in remission, DM-2, HTN, B12 deficiency, gout and peripheral polyneuropathy brought to ED from SNF with altered mental status, and admitted for acute encephalopathy.  Per family patient was able to hold conversation but has gradually developed difficulty comprehending and holding conversation.  He was unable to remember his phones passcode.  Also had urinary incontinence, decreased p.o. intake and some difficulty with ambulation Chelle improvement with his strength during the first week of rehab.  In ED, stable vitals.  WBC 9.4.  K5.2.  EtOH level undetectable.  UA negative.  EKG sinus tachycardia.  CXR without acute finding.  CT head without acute finding..  Neurology consulted and recommended admission to [facility] for possible Warnicke encephalopathy.  Started on high-dose thiamine.  MRI brain ordered.  The next day, MRI brain with no acute intracranial abnormality.  Patient is awake and alert but only oriented to self and place.  Not oriented to time.  No complaints.  He thinks he is in the hospital for neuropathy.  Subjective: Patient noted to be awake alert this morning.  He denies any headaches.  No chest pain shortness of breath.  No nausea vomiting.  He states that he feels less confused today compared to yesterday.     Assessment and plan:  Acute encephalopathy:  Multifactorial including polypharmacy (from Norco Lyrica), possible Warnicke (alcohol abuse), dehydration and delirium.   CT head and MRI brain without acute finding.  Ammonia, UA and TSH unrevealing.  EtOH level undetectable.   His B12 was 982 on 10/17.  He is thiamine level is 124 at the same time.  No focal neurodeficit. Patient was seen by neurology.  Placed on high-dose thiamine. His mentation  appears to have improved.  He is oriented to month place today. PT and OT eval. Dose of Lyrica was decreased. Thiamine level from 03/14/2024 was normal at 124.1.  Repeat level is pending.   History of alcohol use disorder:  Last EtOH use was 3 weeks ago prior to last hospitalization last month.  EtOH level undetectable.    NIDDM-2:  A1c 5.5% on 10/16. Patient noted to be on metformin.  Glipizide discontinued for now.  Continue SSI.  Monitor CBGs.  Hypomagnesemia Will be supplemented.  Await other electrolytes from this morning as well.   Essential hypertension:  Normotensive.  Does not seem to be medications.   Peripheral polyneuropathy:  Takes Lyrica 150 mg twice daily. Decreased Lyrica to 50 mg twice daily in the setting of encephalopathy. Examination of the feet shows good pedal pulses.  No ulcers identified.   Folic acid deficiency:  Folic acid was low at 3.1 about 3 weeks ago during last hospitalization - Continue folate supplementation  Macrocytic anemia:  Likely due to alcohol and folate deficiency. - Continue folic acid supplementation   Generalized weakness: TSH normal. -PT/OT eval  Hyperlipidemia - Continue atorvastatin  Hyperkalemia: Resolved.  DVT prophylaxis: Lovenox Code Status: Full code.  Patient has MOST form at bedside. Family Communication: None at bedside Disposition: Hopefully back to SNF when improved.  Consultants:  Neurology  Procedures: None  Microbiology summarized: None  Objective: Vitals:   04/08/24 2037 04/09/24 0016 04/09/24 0501 04/09/24 0821  BP: 122/76 115/78 130/77 118/82  Pulse: 96 91 88 87  Resp: 17 17 17  16  Temp: 98.8 F (37.1 C) 99.7 F (37.6 C) 98.7 F (37.1 C) 98.7 F (37.1 C)  TempSrc:      SpO2: 93% 94% 93% 93%    Examination:  General appearance: Awake alert.  In no distress Resp: Clear to auscultation bilaterally.  Normal effort Cardio: S1-S2 is normal regular.  No S3-S4.  No rubs murmurs or bruit GI:  Abdomen is soft.  Nontender nondistended.  Bowel sounds are present normal.  No masses organomegaly Extremities: No edema.  Full range of motion of lower extremities. Neurologic: Alert and oriented x3.  No focal neurological deficits.  Physical deconditioning noted.    Sch Meds:  Scheduled Meds:  atorvastatin  80 mg Oral Daily   enoxaparin (LOVENOX) injection  40 mg Subcutaneous Q24H   feeding supplement  237 mL Oral BID BM   folic acid  1 mg Oral Daily   insulin  aspart  0-5 Units Subcutaneous QHS   insulin  aspart  0-9 Units Subcutaneous TID WC   metFORMIN  500 mg Oral Q breakfast   multivitamin with minerals  1 tablet Oral Daily   pregabalin  50 mg Oral BID   [START ON 04/16/2024] thiamine (VITAMIN B1) injection  100 mg Intravenous Daily   Continuous Infusions:  magnesium sulfate bolus IVPB 4 g (04/09/24 0951)   thiamine (VITAMIN B1) injection 500 mg (04/09/24 9392)   Followed by   NOREEN ON 04/10/2024] thiamine (VITAMIN B1) injection     PRN Meds:.acetaminophen  **OR** acetaminophen , bisacodyl, LORazepam, ondansetron  **OR** ondansetron  (ZOFRAN ) IV, senna-docusate    CBC: Recent Labs  Lab 04/07/24 1951 04/08/24 0500 04/09/24 0933  WBC 9.4 8.9 8.1  NEUTROABS 6.8  --   --   HGB 12.3* 11.4* 11.7*  HCT 39.0 36.6* 36.0*  MCV 101.6* 102.2* 98.1  PLT 305 291 288   BMP &GFR Recent Labs  Lab 04/07/24 1951 04/08/24 0500 04/09/24 0303  NA 138 138  --   K 5.2* 4.6  --   CL 100 101  --   CO2 28 26  --   GLUCOSE 107* 116*  --   BUN 11 12  --   CREATININE 0.97 0.92  --   CALCIUM 10.1 9.6  --   MG  --  1.6* 1.5*  PHOS  --  3.5  --    CrCl cannot be calculated (Unknown ideal weight.). Liver & Pancreas: Recent Labs  Lab 04/07/24 1951  AST 33  ALT 17  ALKPHOS 103  BILITOT 1.1  PROT 8.6*  ALBUMIN 3.4*   Recent Labs  Lab 04/07/24 1951  AMMONIA 17   Diabetic: No results for input(s): HGBA1C in the last 72 hours. Recent Labs  Lab 04/08/24 0923 04/08/24 1229  04/08/24 1813 04/08/24 2214 04/09/24 0907  GLUCAP 115* 103* 132* 145* 153*    Thyroid  Function Tests: Recent Labs    04/08/24 0500  TSH 1.820    Anemia Panel: Recent Labs    04/08/24 2011  FOLATE >20.0    Microbiology: No results found for this or any previous visit (from the past 240 hours).  Radiology Studies: No results found.   Skylyn Slezak 04/09/2024   If 7PM-7AM, please contact night-coverage [email or web address] 04/09/2024, 10:11 AM

## 2024-04-09 NOTE — Evaluation (Signed)
 Physical Therapy Evaluation Patient Details Name: Kenneth Joseph MRN: [id number] DOB: 03-20-1956 Today's Date: 04/09/2024  History of Present Illness  Pt is a 68 y.o. male presenting 11/9 to [facility] with AMS from [facility]. MRI brain negative. Started on high dose thiamine. Recent admission in 10/15-23/2025 for colitis and acute neuropathic LE pain. PMH: DM , hiatal hernia, HTN, vitamin B 12 deficiency, alcohol use, peripheral neuropathy, gout  Clinical Impression  PTA pt was in rehab at [facility] and working on mobilization to return home, unfortunately he has been limited in mobility by increased neuropathic pain in his LE. Pt reports that he was being pushed in wheelchair to PT and activities and that he was only able to take a few steps with therapy. Pt required some help with bathing and dressing and facility provided iADLs. Pt was able to come to EOB and take lateral scoot transfers towards HoB with modAx1. Pt not wanting to stand due to pain. PT recommending return to SNF at discharge. PT will continue to follow acutely and refer to Mobility Specialist.         If plan is discharge home, recommend the following: A lot of help with bathing/dressing/bathroom;A lot of help with walking and/or transfers;Assistance with cooking/housework;Assist for transportation;Help with stairs or ramp for entrance   Can travel by private vehicle   No    Equipment Recommendations None recommended by PT  Recommendations for Other Services       Functional Status Assessment Patient has had a recent decline in their functional status and demonstrates the ability to make significant improvements in function in a reasonable and predictable amount of time.     Precautions / Restrictions Precautions Precautions: Fall Restrictions Weight Bearing Restrictions Per Provider Order: No      Mobility  Bed Mobility Overal bed mobility: Needs Assistance Bed Mobility: Supine to Sit, Sit to  Supine     Supine to sit: Min assist Sit to supine: Min assist   General bed mobility comments: min A and maximal cuing for sequencing to come to EoB, with assist of bed rail, pad scoot  of hips, pt able to bring trunk to upright, pt able to bring LE back into bed needs assist and cuing for bridging hips to center of bed assist with bed pad    Transfers Overall transfer level: Needs assistance Equipment used: 1 person hand held assist Transfers: Bed to chair/wheelchair/BSC            Lateral/Scoot Transfers: Mod assist General transfer comment: mod physical assist with bed pad and maximal verbal cuing for sequencing lateral scooting towards HoB, pt refusing to attempt  stand due to neuropathic pain in LE    Ambulation/Gait               General Gait Details: deferred due to pain     Balance Overall balance assessment: Needs assistance Sitting-balance support: Single extremity supported, Bilateral upper extremity supported, No upper extremity supported, Feet supported Sitting balance-Leahy Scale: Fair                                       Pertinent Vitals/Pain Pain Assessment Pain Assessment: 0-10 Pain Score: 10-Worst pain ever Faces Pain Scale: Hurts even more Pain Location: bilateral LE with movement, weightbearing    Home Living Family/patient expects to be discharged to:: Skilled nursing facility  Prior Function Prior Level of Function : Needs assist             Mobility Comments: pt report that he was being wheeled to PT and other activities in RW, at PT he was only able to take a few steps and was limited by neuropathic pain in LE. ADLs Comments: needed assistance getting to [facility] and with bathing, facility provided for iADLs     Extremity/Trunk Assessment   Upper Extremity Assessment Upper Extremity Assessment: Defer to OT evaluation    Lower Extremity Assessment Lower Extremity Assessment: RLE  deficits/detail;LLE deficits/detail RLE Deficits / Details: R ankle reconstruction, RLE: Unable to fully assess due to pain RLE Sensation: history of peripheral neuropathy (neuropathic pain to feet up to knees) RLE Coordination: decreased fine motor LLE Deficits / Details: ROM limited by pain LLE: Unable to fully assess due to pain LLE Sensation: history of peripheral neuropathy (neuropathic pain to feet up to knees) LLE Coordination: decreased fine motor    Cervical / Trunk Assessment Cervical / Trunk Assessment: Normal Cervical / Trunk Exceptions: hx of C1-2 sx  Communication   Communication Communication: No apparent difficulties    Cognition Arousal: Alert Behavior During Therapy: WFL for tasks assessed/performed   PT - Cognitive impairments: Safety/Judgement, Problem solving, Awareness                       PT - Cognition Comments: pt A&Ox4 , able to answer other questions about PLOF at SNF but timeline is hard to follow Following commands: Intact       Cueing Cueing Techniques: Verbal cues, Tactile cues, Other (comments)     General Comments General comments (skin integrity, edema, etc.): VSS on RA        Assessment/Plan    PT Assessment Patient needs continued PT services  PT Problem List Decreased strength;Decreased activity tolerance;Decreased balance;Decreased mobility;Decreased coordination;Impaired sensation;Pain       PT Treatment Interventions DME instruction;Gait training;Functional mobility training;Therapeutic activities;Therapeutic exercise;Balance training;Cognitive remediation;Patient/family education    PT Goals (Current goals can be found in the Care Plan section)  Acute Rehab PT Goals Patient Stated Goal: less pain so he can get around PT Goal Formulation: With patient Time For Goal Achievement: 04/23/24 Potential to Achieve Goals: Fair    Frequency Min 2X/week     Co-evaluation               AM-PAC PT 6 Clicks  Mobility  Outcome Measure Help needed turning from your back to your side while in a flat bed without using bedrails?: None Help needed moving from lying on your back to sitting on the side of a flat bed without using bedrails?: A Lot Help needed moving to and from a bed to a chair (including a wheelchair)?: A Lot Help needed standing up from a chair using your arms (e.g., wheelchair or bedside chair)?: Total Help needed to walk in hospital room?: Total Help needed climbing 3-5 steps with a railing? : Total 6 Click Score: 11    End of Session   Activity Tolerance: Patient limited by pain Patient left: in bed;with call bell/phone within reach;with bed alarm set;with nursing/sitter in room (RN taking blood) Nurse Communication: Mobility status PT Visit Diagnosis: Unsteadiness on feet (R26.81);Muscle weakness (generalized) (M62.81);Difficulty in walking, not elsewhere classified (R26.2);Pain;Other symptoms and signs involving the nervous system (R29.898) Pain - Right/Left:  (bilateral , R ankle) Pain - part of body: Ankle and joints of foot    Time:  9087-9073 PT Time Calculation (min) (ACUTE ONLY): 14 min   Charges:   PT Evaluation $PT Eval Moderate Complexity: 1 Mod   PT General Charges $$ ACUTE PT VISIT: 1 Visit         Lonie Rummell B. Fleeta Lapidus PT, DPT Acute Rehabilitation Services Please use secure chat or  Call Office [phone]   Almarie KATHEE Fleeta [facility] 04/09/2024, 9:47 AM

## 2024-04-09 NOTE — Progress Notes (Signed)
 NEUROLOGY CONSULT FOLLOW UP NOTE   Date of service: April 09, 2024 Patient Name: Kenneth Joseph MRN:  [id number] DOB:  Sep 05, 1955  Interval Hx/subjective   No family at the bedside. Patient is awake and alert laying in the bed in NAD. He states he feels his confusion is better. No new neurological events overnight   Vitals   Vitals:   04/08/24 2037 04/09/24 0016 04/09/24 0501 04/09/24 0821  BP: 122/76 115/78 130/77 118/82  Pulse: 96 91 88 87  Resp: 17 17 17 16   Temp: 98.8 F (37.1 C) 99.7 F (37.6 C) 98.7 F (37.1 C) 98.7 F (37.1 C)  TempSrc:      SpO2: 93% 94% 93% 93%     There is no height or weight on file to calculate BMI.  Physical Exam   Constitutional: Appears well-developed and well-nourished.   Psych: Affect appropriate to situation.   Eyes: No scleral injection.   HENT: No OP obstrucion.   Head: Normocephalic.   Cardiovascular: Normal rate and regular rhythm.   Respiratory: Effort normal, non-labored breathing.   GI: Soft.  No distension. There is no tenderness.   Skin: WDI.    Neurologic Examination     Mental Status -  He is awake and alert, oriented to self, age and month, year, city and name of hospital No aphasia or dysarthria Cranial Nerves II - XII - II - Visual field intact OU . III, IV, VI - Extraocular movements intact . V - Facial sensation intact bilaterally . VII - Facial movement intact bilaterally . VIII - Hearing & vestibular intact bilaterally . X - Palate elevates symmetrically . XI - Chin turning & shoulder shrug intact bilaterally . XII - Tongue protrusion intact .   Motor Strength - 5/5 in bilateral uppers, bilateral lowers 4-/5 Motor Tone - Muscle tone was assessed at the neck and appendages and was normal . Sensory - decreased in bilateral lowers to knees which he states is normal due to his neuropathy  Coordination - no ataxia    Gait and Station - deferred.  Medications  Current Facility-Administered Medications:     acetaminophen  (TYLENOL ) tablet 650 mg, 650 mg, Oral, Q6H PRN, 650 mg at 04/08/24 1549 **OR** acetaminophen  (TYLENOL ) suppository 650 mg, 650 mg, Rectal, Q6H PRN, Lou Claretta HERO, MD   atorvastatin (LIPITOR) tablet 80 mg, 80 mg, Oral, Daily, Amponsah, Prosper M, MD, 80 mg at 04/09/24 0940   bisacodyl (DULCOLAX) EC tablet 5 mg, 5 mg, Oral, Daily PRN, Amponsah, Prosper M, MD   enoxaparin (LOVENOX) injection 40 mg, 40 mg, Subcutaneous, Q24H, Amponsah, Prosper M, MD, 40 mg at 04/09/24 0940   feeding supplement (ENSURE PLUS HIGH PROTEIN) liquid 237 mL, 237 mL, Oral, BID BM, Amponsah, Prosper M, MD, 237 mL at 04/09/24 0940   folic acid (FOLVITE) tablet 1 mg, 1 mg, Oral, Daily, Lou Claretta HERO, MD, 1 mg at 04/09/24 0940   insulin  aspart (novoLOG ) injection 0-5 Units, 0-5 Units, Subcutaneous, QHS, Amponsah, Prosper M, MD   insulin  aspart (novoLOG ) injection 0-9 Units, 0-9 Units, Subcutaneous, TID WC, Amponsah, Prosper M, MD, 2 Units at 04/09/24 0941   LORazepam (ATIVAN) injection 1 mg, 1 mg, Intravenous, Once PRN, Patt Alm Macho, MD   magnesium sulfate IVPB 4 g 100 mL, 4 g, Intravenous, Once, Krishnan, Gokul, MD, Last Rate: 50 mL/hr at 04/09/24 0951, 4 g at 04/09/24 0951   metFORMIN (GLUCOPHAGE) tablet 500 mg, 500 mg, Oral, Q breakfast, Amponsah, Claretta HERO, MD, 500 mg at  04/09/24 0940   multivitamin with minerals tablet 1 tablet, 1 tablet, Oral, Daily, Lou Claretta HERO, MD, 1 tablet at 04/09/24 0940   ondansetron  (ZOFRAN ) tablet 4 mg, 4 mg, Oral, Q6H PRN **OR** ondansetron  (ZOFRAN ) injection 4 mg, 4 mg, Intravenous, Q6H PRN, Lou Claretta HERO, MD   pregabalin (LYRICA) capsule 50 mg, 50 mg, Oral, BID, Gonfa, Taye T, MD, 50 mg at 04/09/24 0940   senna-docusate (Senokot-S) tablet 1 tablet, 1 tablet, Oral, QHS PRN, Lou Claretta HERO, MD   thiamine (VITAMIN B1) 500 mg in sodium chloride  0.9 % 50 mL IVPB, 500 mg, Intravenous, Q8H, Last Rate: 110 mL/hr at 04/09/24 0607, 500 mg at 04/09/24 0607  **FOLLOWED BY** [START ON 04/10/2024] thiamine (VITAMIN B1) 250 mg in sodium chloride  0.9 % 50 mL IVPB, 250 mg, Intravenous, Daily **FOLLOWED BY** [START ON 04/16/2024] thiamine (VITAMIN B1) injection 100 mg, 100 mg, Intravenous, Daily, Patt Alm Macho, MD  Labs and Diagnostic Imaging   CBC:  Recent Labs  Lab 04/07/24 1951 04/08/24 0500 04/09/24 0933  WBC 9.4 8.9 8.1  NEUTROABS 6.8  --   --   HGB 12.3* 11.4* 11.7*  HCT 39.0 36.6* 36.0*  MCV 101.6* 102.2* 98.1  PLT 305 291 288    Basic Metabolic Panel:  Lab Results  Component Value Date   NA 136 04/09/2024   K 4.0 04/09/2024   CO2 24 04/09/2024   GLUCOSE 142 (H) 04/09/2024   BUN 11 04/09/2024   CREATININE 0.96 04/09/2024   CALCIUM 9.2 04/09/2024   GFRNONAA >60 04/09/2024   GFRAA >60 08/22/2017   Lipid Panel: No results found for: LDLCALC HgbA1c:  Lab Results  Component Value Date   HGBA1C 5.5 03/14/2024   Urine Drug Screen: No results found for: LABOPIA, COCAINSCRNUR, LABBENZ, AMPHETMU, THCU, LABBARB  Alcohol Level     Component Value Date/Time   [facility] <15 04/07/2024 1951   INR No results found for: INR APTT No results found for: APTT AED levels: No results found for: PHENYTOIN, ZONISAMIDE, LAMOTRIGINE, LEVETIRACETA  CT Head without contrast(Personally reviewed): No acute process.   MRI Brain(Personally reviewed): No acute process.   TSH 1.820 Ammonia 17 ETOH negative  UA negative Thiamine pending, thiamine on 10/16 124  B12 982 on 10/17 Folate > 20, 3.1 on 10/16 RPR is negative  Assessment   Kenneth Joseph is a 68 y.o. male DM , hiatal hernia, HTN, vitamin B 12 deficiency, alcohol use, peripheral neuropathy, gout who presented from his SNF for AMS.  With his history of alcoholism, and rapid improvement after starting high-dose thiamine, I had suspected possibly that this could be thiamine deficiency, but he did have a normal thiamine level on 10/16 making this less likely.   That being said, we continue it for now.   He has bilateral pain and a distal distribution, consistent with neuropathy and has been evaluated by outpatient neurology who felt that this is most likely diabetic neuropathy.  He does have folate deficiency which can certainly contribute to neuropathy.   It is possible that this just represents delirium in the setting of being in an unusual environment, prolonged hospitalization, recent gram-negative infection, etc.  The fact that he is improving is reassuring.    Recommendations  - Continue thiamine, folate  - PT/OT as able  - Delirium precautions ____________________________________________________________________   Signed, Karna DELENA Geralds, NP [facility]

## 2024-04-09 NOTE — Evaluation (Signed)
 Occupational Therapy Evaluation Patient Details Name: Kenneth Joseph MRN: [id number] DOB: December 16, 1955 Today's Date: 04/09/2024   History of Present Illness   Pt is a 68 y.o. male presenting 11/9 to [facility] with AMS from [facility] rehab. MRI brain negative. Started on high dose thiamine. Recent admission in 10/15-23/2025 for colitis and acute neuropathic LE pain. PMH: DM , hiatal hernia, HTN, vitamin B 12 deficiency, alcohol use, peripheral neuropathy, gout     Clinical Impressions PTA, pt recently at SNF where he reports mobility progression has been difficult due to BLE weakness and pain. Upon eval, pt is oriented and following up to 2 step commands, presenting with generalized weakness, BLE pain, and decreased executive functioning skills. Pt needing up to min A for bed mobility and CGA for STS with stedy to optimize safety, with poor activity tolerance and muscular endurance in standing. Will continue to follow. Patient will benefit from continued inpatient follow up therapy, <3 hours/day      If plan is discharge home, recommend the following:   Assistance with cooking/housework;Assist for transportation;Direct supervision/assist for medications management;Direct supervision/assist for financial management;Help with stairs or ramp for entrance;A lot of help with walking and/or transfers;A lot of help with bathing/dressing/bathroom     Functional Status Assessment   Patient has had a recent decline in their functional status and demonstrates the ability to make significant improvements in function in a reasonable and predictable amount of time.     Equipment Recommendations   BSC/3in1;Wheelchair (measurements OT);Wheelchair cushion (measurements OT)     Recommendations for Other Services         Precautions/Restrictions   Precautions Precautions: Fall Restrictions Weight Bearing Restrictions Per Provider Order: No     Mobility Bed Mobility Overal bed  mobility: Needs Assistance Bed Mobility: Supine to Sit, Sit to Supine     Supine to sit: Min assist Sit to supine: Min assist   General bed mobility comments: min A and maximal cuing for sequencing to come to EoB, with assist of bed rail, pad scoot  of hips, pt able to bring trunk to upright, pt able to bring LE back into bed needs assist and cuing for bridging hips to center of bed assist with bed pad    Transfers Overall transfer level: Needs assistance Equipment used: Ambulation equipment used Transfers: Sit to/from Stand Sit to Stand: Min assist           General transfer comment: pt reports that he has not been standing much with therapy at rehab and has not been able to achieve full stand. introduced stedy to optimize safety with +1 assist. pt able to rise with heavy reliance on arms with CGA, however, fatigues easily and knees mildly buckling with standing ~3 seconds, however, was able to remain standing for 15 seconds      Balance Overall balance assessment: Needs assistance Sitting-balance support: Single extremity supported, Bilateral upper extremity supported, No upper extremity supported, Feet supported Sitting balance-Leahy Scale: Fair     Standing balance support: Bilateral upper extremity supported, Reliant on assistive device for balance, During functional activity Standing balance-Leahy Scale: Poor                             ADL either performed or assessed with clinical judgement   ADL Overall ADL's : Needs assistance/impaired Eating/Feeding: Set up;Sitting   Grooming: Set up;Sitting   Upper Body Bathing: Set up;Contact guard assist;Sitting   Lower Body Bathing:  Moderate assistance;Sitting/lateral leans   Upper Body Dressing : Contact guard assist;Sitting   Lower Body Dressing: Bed level;Minimal assistance Lower Body Dressing Details (indicate cue type and reason): for L sock Toilet Transfer: Contact guard assist Toilet Transfer Details  (indicate cue type and reason): STS with stedy with heavy reliance on BUE                 Vision Baseline Vision/History: 1 Wears glasses (readers) Ability to See in Adequate Light: 0 Adequate Patient Visual Report: No change from baseline Vision Assessment?: No apparent visual deficits;Wears glasses for reading     Perception         Praxis         Pertinent Vitals/Pain Pain Assessment Pain Assessment: 0-10 Faces Pain Scale: Hurts even more Pain Location: bilateral LE with movement, weightbearing Pain Descriptors / Indicators: Sore, Tightness, Tender Pain Intervention(s): Limited activity within patient's tolerance, Monitored during session     Extremity/Trunk Assessment Upper Extremity Assessment Upper Extremity Assessment: Generalized weakness;RUE deficits/detail;LUE deficits/detail RUE Deficits / Details: generalized weakness; impaired sensation 2/2 hx of neuropathy; decreased fine motor coordination LUE Deficits / Details: generalized weakness; impaired sensation 2/2 hx of neuropathy; decreased fine motor coordination   Lower Extremity Assessment Lower Extremity Assessment: Defer to PT evaluation RLE Deficits / Details: R ankle reconstruction, RLE: Unable to fully assess due to pain RLE Sensation: history of peripheral neuropathy (neuropathic pain to feet up to knees) RLE Coordination: decreased fine motor LLE Deficits / Details: ROM limited by pain LLE: Unable to fully assess due to pain LLE Sensation: history of peripheral neuropathy (neuropathic pain to feet up to knees) LLE Coordination: decreased fine motor   Cervical / Trunk Assessment Cervical / Trunk Assessment: Normal Cervical / Trunk Exceptions: hx of C1-2 sx   Communication Communication Communication: No apparent difficulties   Cognition Arousal: Alert Behavior During Therapy: WFL for tasks assessed/performed Cognition: Cognition impaired, No family/caregiver present to determine baseline      Awareness: Online awareness impaired Memory impairment (select all impairments): Short-term memory Attention impairment (select first level of impairment): Selective attention (easily internally distracted) Executive functioning impairment (select all impairments): Reasoning, Problem solving OT - Cognition Comments: Pt AAOx4 and pleasant throughout session. Pt easily internally distracted and tangential in conversation requiring occasional redirection this session to attend to tasks.                 Following commands: Intact       Cueing  General Comments   Cueing Techniques: Verbal cues;Tactile cues;Other (comments)  VSS on RA   Exercises     Shoulder Instructions      Home Living Family/patient expects to be discharged to:: Skilled nursing facility                                 Additional Comments: pt reports that prior to hospitalization in October, he was at home, predominantly alone during the day as his nephew works and son is deployed at [employer].      Prior Functioning/Environment Prior Level of Function : Needs assist             Mobility Comments: pt report that he was being wheeled to PT and other activities in RW, at PT he was only able to take a few steps and was limited by neuropathic pain in LE. ADLs Comments: needed assistance getting to [facility] and with bathing, facility provided  for iADLs. per chart, he was managing own finances until very recently    OT Problem List: Decreased strength;Decreased activity tolerance;Impaired balance (sitting and/or standing);Decreased coordination;Decreased knowledge of use of DME or AE;Decreased knowledge of precautions;Decreased cognition;Pain;Impaired sensation   OT Treatment/Interventions: Self-care/ADL training;Therapeutic exercise;Energy conservation;DME and/or AE instruction;Therapeutic activities;Cognitive remediation/compensation;Patient/family education;Balance training      OT  Goals(Current goals can be found in the care plan section)   Acute Rehab OT Goals OT Goal Formulation: With patient Time For Goal Achievement: 04/23/24 Potential to Achieve Goals: Good   OT Frequency:  Min 2X/week    Co-evaluation              AM-PAC OT 6 Clicks Daily Activity     Outcome Measure Help from another person eating meals?: A Little Help from another person taking care of personal grooming?: A Little Help from another person toileting, which includes using toliet, bedpan, or urinal?: A Little Help from another person bathing (including washing, rinsing, drying)?: A Little Help from another person to put on and taking off regular upper body clothing?: A Little Help from another person to put on and taking off regular lower body clothing?: A Little 6 Click Score: 18   End of Session Equipment Utilized During Treatment: Gait belt;Other (comment) (stedy) Nurse Communication: Mobility status  Activity Tolerance: Patient tolerated treatment well;Patient limited by pain Patient left: in bed;with call bell/phone within reach;with bed alarm set (pt deferred moving to chair)  OT Visit Diagnosis: Unsteadiness on feet (R26.81);Other abnormalities of gait and mobility (R26.89);Muscle weakness (generalized) (M62.81);History of falling (Z91.81);Ataxia, unspecified (R27.0);Other symptoms and signs involving cognitive function;Pain                Time: 1130-1150 OT Time Calculation (min): 20 min Charges:  OT General Charges $OT Visit: 1 Visit OT Evaluation $OT Eval Moderate Complexity: 1 Mod  Elma JONETTA Lebron FREDERICK, OTR/L [facility] Acute Rehabilitation Office: [phone]   Elma JONETTA Lebron 04/09/2024, 12:34 PM

## 2024-04-10 DIAGNOSIS — E1140 Type 2 diabetes mellitus with diabetic neuropathy, unspecified: Secondary | ICD-10-CM

## 2024-04-10 DIAGNOSIS — E538 Deficiency of other specified B group vitamins: Secondary | ICD-10-CM | POA: Diagnosis not present

## 2024-04-10 DIAGNOSIS — G9340 Encephalopathy, unspecified: Secondary | ICD-10-CM | POA: Diagnosis not present

## 2024-04-10 DIAGNOSIS — Z8669 Personal history of other diseases of the nervous system and sense organs: Secondary | ICD-10-CM | POA: Diagnosis not present

## 2024-04-10 DIAGNOSIS — F1091 Alcohol use, unspecified, in remission: Secondary | ICD-10-CM | POA: Diagnosis not present

## 2024-04-10 DIAGNOSIS — R410 Disorientation, unspecified: Secondary | ICD-10-CM | POA: Diagnosis not present

## 2024-04-10 DIAGNOSIS — G621 Alcoholic polyneuropathy: Secondary | ICD-10-CM | POA: Diagnosis not present

## 2024-04-10 LAB — COMPREHENSIVE METABOLIC PANEL WITH GFR
ALT: 17 U/L (ref 0–44)
AST: 28 U/L (ref 15–41)
Albumin: 2.1 g/dL — ABNORMAL LOW (ref 3.5–5.0)
Alkaline Phosphatase: 75 U/L (ref 38–126)
Anion gap: 11 (ref 5–15)
BUN: 13 mg/dL (ref 8–23)
CO2: 24 mmol/L (ref 22–32)
Calcium: 8.6 mg/dL — ABNORMAL LOW (ref 8.9–10.3)
Chloride: 102 mmol/L (ref 98–111)
Creatinine, Ser: 1.06 mg/dL (ref 0.61–1.24)
GFR, Estimated: >60 mL/min (ref >60)
Glucose, Bld: 152 mg/dL — ABNORMAL HIGH (ref 70–99)
Potassium: 4.3 mmol/L (ref 3.5–5.1)
Sodium: 137 mmol/L (ref 135–145)
Total Bilirubin: 0.9 mg/dL (ref 0.0–1.2)
Total Protein: 7.2 g/dL (ref 6.5–8.1)

## 2024-04-10 LAB — GLUCOSE, CAPILLARY
Glucose-Capillary: 140 mg/dL — ABNORMAL HIGH (ref 70–99)
Glucose-Capillary: 111 mg/dL — ABNORMAL HIGH (ref 70–99)
Glucose-Capillary: 179 mg/dL — ABNORMAL HIGH (ref 70–99)
Glucose-Capillary: 107 mg/dL — ABNORMAL HIGH (ref 70–99)

## 2024-04-10 LAB — CBC
HCT: 33.4 % — ABNORMAL LOW (ref 39.0–52.0)
Hemoglobin: 11 g/dL — ABNORMAL LOW (ref 13.0–17.0)
MCH: 32.2 pg (ref 26.0–34.0)
MCHC: 32.9 g/dL (ref 30.0–36.0)
MCV: 97.7 fL (ref 80.0–100.0)
Platelets: 271 K/uL (ref 150–400)
RBC: 3.42 MIL/uL — ABNORMAL LOW (ref 4.22–5.81)
RDW: 15.9 % — ABNORMAL HIGH (ref 11.5–15.5)
WBC: 8.3 K/uL (ref 4.0–10.5)
nRBC: 0 % (ref 0.0–0.2)

## 2024-04-10 LAB — VITAMIN B1: Vitamin B1 (Thiamine): 263.4 nmol/L — ABNORMAL HIGH (ref 66.5–200.0)

## 2024-04-10 LAB — MAGNESIUM: Magnesium: 2 mg/dL (ref 1.7–2.4)

## 2024-04-10 MED ORDER — THIAMINE MONONITRATE 100 MG PO TABS
100.0000 mg | ORAL_TABLET | Freq: Every day | ORAL | Status: DC
  Administered 2024-04-10 – 2024-04-19 (×10): 100 mg via ORAL
  Filled 2024-04-10 (×10): qty 1

## 2024-04-10 NOTE — Plan of Care (Signed)

## 2024-04-10 NOTE — Progress Notes (Signed)
 Mobility Specialist: Progress Note   04/10/24 1400  Mobility  Activity Dangled on edge of bed  Level of Assistance Maximum assist, patient does 25-49%  Activity Response Tolerated fair  Mobility Referral Yes  Mobility visit 1 Mobility  Mobility Specialist Start Time (ACUTE ONLY) 1400  Mobility Specialist Stop Time (ACUTE ONLY) 1419  Mobility Specialist Time Calculation (min) (ACUTE ONLY) 19 min    Pt received in bed, pleasant and agreeable to try with encouragement. Sister, Evelia, present and helpful. MaxA to attempt to fully sit on EOB, pt required assist to move LE off the bed and assist with trunk elevation. Limited by general body soreness as well as RUE swelling and severe pain/sensitivity this session. Ultimately declined further mobility. Returned pt to supine, pillow and ice pack replaced under RUE. Left in bed with all needs met, call bell in reach.   Kenneth Joseph Mobility Specialist Please contact via SecureChat or Rehab office at [phone]

## 2024-04-10 NOTE — Progress Notes (Signed)
 NEUROLOGY CONSULT FOLLOW UP NOTE   Date of service: April 10, 2024 Patient Name: Marisol Glazer MRN:  [id number] DOB:  03-May-1956  Interval Hx/subjective   Patient reports that he feels like his mentation continues to improve, but is quite concerned about his right wrist pain.  Vitals   Vitals:   04/09/24 2009 04/10/24 0552 04/10/24 0745 04/10/24 1206  BP: 125/78 133/77 138/87 129/76  Pulse: 92 94 96 95  Resp:   19 19  Temp: 99.2 F (37.3 C) 98.5 F (36.9 C) 98.5 F (36.9 C) 98.6 F (37 C)  TempSrc:      SpO2: 92% 92% 92% 96%     There is no height or weight on file to calculate BMI.  Physical Exam   Constitutional: Appears well-developed and well-nourished.    Neurologic Examination     Mental Status -  He is awake and alert, oriented to self, age, year, city and name of hospital he is not able to give me the month today.  No aphasia or dysarthria Cranial Nerves II - XII - II - Visual field intact OU . III, IV, VI - Extraocular movements intact . V - Facial sensation intact bilaterally . VII - Facial movement intact bilaterally . VIII - Hearing & vestibular intact bilaterally . X - Palate elevates symmetrically . XI - Chin turning & shoulder shrug intact bilaterally . XII - Tongue protrusion intact .   Motor Strength - 5/5 in bilateral uppers, bilateral lowers 4-/5 Motor Tone - Muscle tone was assessed at the neck and appendages and was normal . Sensory - decreased in bilateral lowers to knees which he states is normal due to his neuropathy  Coordination - no ataxia    Gait and Station - deferred.  Medications  Current Facility-Administered Medications:    acetaminophen  (TYLENOL ) tablet 650 mg, 650 mg, Oral, Q6H PRN, 650 mg at 04/08/24 1549 **OR** acetaminophen  (TYLENOL ) suppository 650 mg, 650 mg, Rectal, Q6H PRN, Lou, Claretta HERO, MD   atorvastatin (LIPITOR) tablet 80 mg, 80 mg, Oral, Daily, Amponsah, Prosper M, MD, 80 mg at 04/10/24 9162   bisacodyl  (DULCOLAX) EC tablet 5 mg, 5 mg, Oral, Daily PRN, Amponsah, Prosper M, MD   enoxaparin (LOVENOX) injection 40 mg, 40 mg, Subcutaneous, Q24H, Amponsah, Claretta HERO, MD, 40 mg at 04/10/24 0836   feeding supplement (ENSURE PLUS HIGH PROTEIN) liquid 237 mL, 237 mL, Oral, BID BM, Amponsah, Prosper M, MD, 237 mL at 04/10/24 9164   folic acid (FOLVITE) tablet 1 mg, 1 mg, Oral, Daily, Lou Claretta HERO, MD, 1 mg at 04/10/24 9162   insulin  aspart (novoLOG ) injection 0-5 Units, 0-5 Units, Subcutaneous, QHS, Amponsah, Claretta HERO, MD   insulin  aspart (novoLOG ) injection 0-9 Units, 0-9 Units, Subcutaneous, TID WC, Lou Claretta HERO, MD, 1 Units at 04/10/24 0844   LORazepam (ATIVAN) injection 1 mg, 1 mg, Intravenous, Once PRN, Patt Alm Macho, MD   metFORMIN (GLUCOPHAGE) tablet 500 mg, 500 mg, Oral, Q breakfast, Lou Claretta HERO, MD, 500 mg at 04/10/24 9162   multivitamin with minerals tablet 1 tablet, 1 tablet, Oral, Daily, Lou Claretta HERO, MD, 1 tablet at 04/10/24 9162   ondansetron  (ZOFRAN ) tablet 4 mg, 4 mg, Oral, Q6H PRN **OR** ondansetron  (ZOFRAN ) injection 4 mg, 4 mg, Intravenous, Q6H PRN, Lou, Claretta HERO, MD   pregabalin (LYRICA) capsule 50 mg, 50 mg, Oral, BID, Gonfa, Taye T, MD, 50 mg at 04/10/24 0837   senna-docusate (Senokot-S) tablet 1 tablet, 1 tablet, Oral, QHS PRN,  Lou Claretta HERO, MD   thiamine (VITAMIN B1) tablet 100 mg, 100 mg, Oral, Daily, Arlon Carliss ORN, DO, 100 mg at 04/10/24 1220  Labs and Diagnostic Imaging   CBC:  Recent Labs  Lab 04/07/24 1951 04/08/24 0500 04/09/24 0933 04/10/24 0226  WBC 9.4   < > 8.1 8.3  NEUTROABS 6.8  --   --   --   HGB 12.3*   < > 11.7* 11.0*  HCT 39.0   < > 36.0* 33.4*  MCV 101.6*   < > 98.1 97.7  PLT 305   < > 288 271   < > = values in this interval not displayed.    Basic Metabolic Panel:  Lab Results  Component Value Date   NA 137 04/10/2024   K 4.3 04/10/2024   CO2 24 04/10/2024   GLUCOSE 152 (H) 04/10/2024   BUN 13  04/10/2024   CREATININE 1.06 04/10/2024   CALCIUM 8.6 (L) 04/10/2024   GFRNONAA >60 04/10/2024   GFRAA >60 08/22/2017   Lipid Panel: No results found for: LDLCALC HgbA1c:  Lab Results  Component Value Date   HGBA1C 5.5 03/14/2024   Urine Drug Screen: No results found for: LABOPIA, COCAINSCRNUR, LABBENZ, AMPHETMU, THCU, LABBARB  Alcohol Level     Component Value Date/Time   [facility] <15 04/07/2024 1951    CT Head without contrast(Personally reviewed): No acute process.   MRI Brain(Personally reviewed): No acute process.   TSH 1.820 Ammonia 17 ETOH negative  UA negative Thiamine pending, thiamine on 10/16 124  B12 982 on 10/17 Folate > 20, 3.1 on 10/16 RPR is negative  Assessment   Danna Casella is a 68 y.o. male DM , hiatal hernia, HTN, vitamin B 12 deficiency, alcohol use, peripheral neuropathy, gout who presented from his SNF for AMS.  With his history of alcoholism, and rapid improvement after starting high-dose thiamine, I had suspected possibly that this could be thiamine deficiency, but he did have a normal thiamine level on 10/16 making this less likely.  That being said, will continue it for now.   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  He does have folate deficiency which can certainly contribute to neuropathy.   It is possible that this just represents delirium in the setting of being in an unusual environment, prolonged hospitalization, recent gram-negative infection, etc.  The fact that he is improving is reassuring.  I suspect that he will have gradual improvement over time.  I am not sure that thiamine deficiency played any role, but he did seem to improve following this.  I think multifactorial delirium has played a significant role, and would expect this to gradually improve over  time.  Recommendations  - Continue thiamine, folate  - PT/OT as able  - Delirium precautions  - Neurology will be available as needed.  ____________________________________________________________________   Aisha Seals, MD [facility]   If 7pm- 7am, please page neurology on call as listed in AMION.

## 2024-04-10 NOTE — Care Management Important Message (Signed)
 Important Message  Patient Details  Name: Kenneth Joseph MRN: [id number] Date of Birth: June 27, 1955   Important Message Given:  Yes - Medicare IM     Claretta Deed 04/10/2024, 1:43 PM

## 2024-04-10 NOTE — Care Management Important Message (Signed)
 Important Message  Patient Details  Name: Kenneth Joseph MRN: [id number] Date of Birth: November 01, 1955   Important Message Given:  Yes - Medicare IM     Claretta Deed 04/10/2024, 1:45 PM

## 2024-04-10 NOTE — Progress Notes (Signed)
 Progress Note   Patient: Kenneth Joseph FMW:[id number] DOB: [birth year] DOA: 04/07/2024  DOS: the patient was seen and examined on 04/10/2024   Brief hospital course:  68 year old M with PMH of prior EtOH use in remission, DM-2, HTN, B12 deficiency, gout and peripheral polyneuropathy brought to ED from SNF with altered mental status, and admitted for acute encephalopathy.   Assessment and Plan:  Acute metabolic encephalopathy - Likely multifactorial etiology including polypharmacy, Warnicke encephalopathy, volume depletion, delirium.  Appears to be showing improvement.  CT/MRI brain negative.  Other lab workup unremarkable.  EtOH undetected.  Neurology following closely.  Showing improvement with high-dose IV thiamine.  History of alcohol use disorder - Reported last EtOH use was 3 weeks prior to hospitalization.  No noted withdrawal.  Diabetes mellitus - Insulin  sliding scale on board.  Hypomagnesemia - Supplementation initiated.  Will recheck magnesium and BMP in AM.  Right arm pain - Noted tender this morning, near site of IV infiltration.  Mildly edematous but no erythema.  Able to move distal digits, no tenderness in elbow or wrist.  Minimal tenderness in dorsal aspect of right forearm.  Will continue to monitor for now.  Folate deficiency - Supplementation on board.  Peripheral polyneuropathy - Lyrica reduced down to 100 mg twice daily.  Physical debilitation muscle weakness - PT/OT on board.  Goals of care - Recommending STR.  Patient [facility].  Awaiting auth.  Working closely with TOC.   Subjective: Patient resting comfortably this morning.  Complaining of tender right forearm.  No injury however IV likely infiltrated.  No fevers, shortness of breath, chest pain, nausea, vomiting, abdominal pain.  No problems moving fingers.  Physical Exam:  Vitals:   04/09/24 2009 04/10/24 0552 04/10/24 0745 04/10/24 1206  BP: 125/78 133/77 138/87 129/76  Pulse: 92 94 96  95  Resp:   19 19  Temp: 99.2 F (37.3 C) 98.5 F (36.9 C) 98.5 F (36.9 C) 98.6 F (37 C)  TempSrc:      SpO2: 92% 92% 92% 96%    GENERAL:  Alert, pleasant, mildly lethargic HEENT:  EOMI CARDIOVASCULAR:  RRR, no murmurs appreciated RESPIRATORY:  Clear to auscultation, no wheezing, rales, or rhonchi GASTROINTESTINAL:  Soft, nontender, nondistended EXTREMITIES: Tender right forearm, mildly edematous NEURO: A&O x 2, no new focal deficits appreciated SKIN:  No rashes noted PSYCH:  Appropriate mood and affect, anxious    Data Reviewed:  Imaging Studies: MR Brain W and Wo Contrast Result Date: 04/08/2024 EXAM: MRI BRAIN WITH AND WITHOUT CONTRAST 04/08/2024 09:02:58 AM TECHNIQUE: Multiplanar multisequence MRI of the head/brain was performed with and without the administration of 7.5 mL of gadobutrol (GADAVIST) 1 MMOL/ML intravenous contrast. COMPARISON: Head CT 04/07/2024. CLINICAL HISTORY: 68 year old male. Weakness and confusion. FINDINGS: BRAIN AND VENTRICLES: No acute infarct. No cortical encephalomalacia or chronic cerebral blood products. Minimal to mild for age nonspecific cerebral white matter signal changes. No acute intracranial hemorrhage. No mass effect or midline shift. No hydrocephalus. The sella is unremarkable. Normal flow voids. Following contrast the major dural venous sinuses are enhancing and appear to be patent. No abnormal intracranial enhancement. No dural thickening. ORBITS: No acute abnormality. SINUSES: Mild paranasal sinus mucosal thickening and left maxillary retention cyst. Mastoids are clear. Grossly normal visible internal auditory structures. BONES AND SOFT TISSUES: Negative for age of visible cervical spine. Normal bone marrow signal and enhancement. No acute soft tissue abnormality. LIMITATIONS/ARTIFACTS: Significant bowel gas outflow. IMPRESSION: 1. No acute intracranial abnormality. Negative for age MRI  appearance of the brain. Electronically signed by:  Helayne Hurst MD 04/08/2024 09:54 AM EST RP Workstation: [id number]   CT HEAD WO CONTRAST ( ) Result Date: 04/07/2024 EXAM: CT HEAD WITHOUT CONTRAST 04/07/2024 08:10:24 PM TECHNIQUE: CT of the head was performed without the administration of intravenous contrast. Automated exposure control, iterative reconstruction, and/or weight based adjustment of the mA/kV was utilized to reduce the radiation dose to as low as reasonably achievable. COMPARISON: CT head 11/22/2023. CLINICAL HISTORY: Mental status change, unknown cause. FINDINGS: BRAIN AND VENTRICLES: No acute hemorrhage. No evidence of acute infarct. No hydrocephalus. No extra-axial collection. No mass effect or midline shift. Atherosclerotic calcifications are present within the cavernous internal carotid arteries. Patchy and confluent areas of decreased attenuation are noted throughout the deep and periventricular white matter of the cerebral hemispheres bilaterally, suggestive of chronic microvascular ischemic changes. ORBITS: Bilateral lens replacement. SINUSES: No acute abnormality. SOFT TISSUES AND SKULL: No acute soft tissue abnormality. No skull fracture. IMPRESSION: 1. No acute intracranial abnormality. Electronically signed by: Morgane Naveau MD 04/07/2024 08:15 PM EST RP Workstation: [id number]   DG Chest Port 1 View Result Date: 04/07/2024 EXAM: 1 VIEW(S) XRAY OF THE CHEST 04/07/2024 07:18:00 PM COMPARISON: Chest x-ray 03/18/2023. CLINICAL HISTORY: AMS. FINDINGS: LUNGS AND PLEURA: No focal pulmonary opacity. No pulmonary edema. No pleural effusion. No pneumothorax. HEART AND MEDIASTINUM: No acute abnormality of the cardiac and mediastinal silhouettes. BONES AND SOFT TISSUES: No acute osseous abnormality. IMPRESSION: 1. No acute proces Electronically signed by: Greig Pique MD 04/07/2024 07:25 PM EST RP Workstation: [id number]   VAS US  LOWER EXTREMITY VENOUS (DVT) Result Date: 03/18/2024  Lower Venous DVT Study Patient Name:  Kenneth Joseph   Date of Exam:   03/17/2024 Medical Rec #: [id number]        Accession #:    [id number] Date of Birth: 1955/07/02        Patient Gender: M Patient Age:   17 years Exam Location:  [facility] Procedure:      VAS US  LOWER EXTREMITY VENOUS (DVT) Referring Phys: JESSICA VANN --------------------------------------------------------------------------------  Indications: Worsening lower extremity pain. 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  Comparison Study: No prior study on file Performing Technologist: Alberta Lis RVS  Examination Guidelines: A complete evaluation includes B-mode imaging, spectral Doppler, color Doppler, and power Doppler as needed of all accessible portions of each vessel. Bilateral testing is considered an integral part of a complete examination. Limited examinations for reoccurring indications may be performed as noted. The reflux portion of the exam is performed with the patient in reverse Trendelenburg.  +---------+---------------+---------+-----------+----------+--------------+ RIGHT    CompressibilityPhasicitySpontaneityPropertiesThrombus Aging +---------+---------------+---------+-----------+----------+--------------+ CFV      Full           Yes      Yes                                 +---------+---------------+---------+-----------+----------+--------------+ SFJ      Full                                                        +---------+---------------+---------+-----------+----------+--------------+ FV Prox  Full           Yes      Yes                                 +---------+---------------+---------+-----------+----------+--------------+  FV Mid   Full                                                        +---------+---------------+---------+-----------+----------+--------------+ FV DistalFull                                                         +---------+---------------+---------+-----------+----------+--------------+ PFV      Full                                                        +---------+---------------+---------+-----------+----------+--------------+ POP      Full           Yes      Yes                                 +---------+---------------+---------+-----------+----------+--------------+ PTV      Full                                                        +---------+---------------+---------+-----------+----------+--------------+ PERO     Full                                                        +---------+---------------+---------+-----------+----------+--------------+   +---------+---------------+---------+-----------+----------+--------------+ LEFT     CompressibilityPhasicitySpontaneityPropertiesThrombus Aging +---------+---------------+---------+-----------+----------+--------------+ CFV      Full           Yes      Yes                                 +---------+---------------+---------+-----------+----------+--------------+ SFJ      Full                                                        +---------+---------------+---------+-----------+----------+--------------+ FV Prox  Full                                                        +---------+---------------+---------+-----------+----------+--------------+ FV Mid   Full                                                        +---------+---------------+---------+-----------+----------+--------------+  FV DistalFull                                                        +---------+---------------+---------+-----------+----------+--------------+ PFV      Full                                                        +---------+---------------+---------+-----------+----------+--------------+ POP      Full           Yes      Yes                                  +---------+---------------+---------+-----------+----------+--------------+ PTV      Full                                                        +---------+---------------+---------+-----------+----------+--------------+ PERO     Full                                                        +---------+---------------+---------+-----------+----------+--------------+     Summary: BILATERAL: - No evidence of deep vein thrombosis seen in the lower extremities, bilaterally. -No evidence of popliteal cyst, bilaterally.   *See table(s) above for measurements and observations. Electronically signed by Lonni Gaskins MD on 03/18/2024 at 1:16:22 PM.    Final    DG CHEST PORT 1 VIEW Result Date: 03/17/2024 CLINICAL DATA:  Fever. EXAM: PORTABLE CHEST 1 VIEW COMPARISON:  03/13/2024 FINDINGS: Lungs are adequately inflated and otherwise clear. Cardiomediastinal silhouette and remainder of the exam is unchanged. IMPRESSION: No active disease. Electronically Signed   By: Toribio Agreste M.D.   On: 03/17/2024 10:46   US  Abdomen Limited RUQ (LIVER/GB) Result Date: 03/14/2024 EXAM: Right Upper Quadrant Abdominal Ultrasound 03/14/2024 04:56:00 AM TECHNIQUE: Real-time ultrasonography of the right upper quadrant of the abdomen was performed. COMPARISON: CT of the abdomen and pelvis dated 03/13/2024. CLINICAL HISTORY: 6194 Acute cholecystitis 6194. Acute cholecystitis 6194. FINDINGS: LIVER: The liver is diffusely echogenic suggesting underlying steatosis. No intrahepatic biliary ductal dilatation. No evidence of mass. There is normal hepatopetal blood flow within the portal vein. BILIARY SYSTEM: No pericholecystic fluid. The gallbladder wall measures 4 mm in thickness. No cholelithiasis. The patient is not focally tender over the gallbladder. Common bile duct is within normal limits measuring 5 mm. RIGHT KIDNEY: The right kidney is grossly unremarkable in appearances without evidence of hydronephrosis, echogenic  calculi or worrisome mass lesions. OTHER: No right upper quadrant ascites. IMPRESSION: 1. No sonographic evidence of acute cholecystitis. 2. Hepatic steatosis. Electronically signed by: Evalene Coho MD 03/14/2024 05:05 AM EDT RP Workstation: [id number]   CT ABDOMEN PELVIS W CONTRAST Result Date: 03/13/2024 EXAM: CT ABDOMEN AND PELVIS WITH CONTRAST 03/13/2024 07:19:36 PM TECHNIQUE: CT of the abdomen  and pelvis was performed with the administration of 75 mL of iohexol (OMNIPAQUE) 350 MG/ML injection. Multiplanar reformatted images are provided for review. Automated exposure control, iterative reconstruction, and/or weight-based adjustment of the mA/kV was utilized to reduce the radiation dose to as low as reasonably achievable. COMPARISON: CT abdomen and pelvis 03/14/2018. CLINICAL HISTORY: Diarrhea. Hypotension; Diarrhea x several days; Hx of hep C; 75 ml omni 350 FINDINGS: LOWER CHEST: No acute abnormality. LIVER: The liver is unremarkable. GALLBLADDER AND BILE DUCTS: There is trace inflammatory stranding surrounding the gallbladder. The gallbladder is distended. No biliary ductal dilatation. SPLEEN: No acute abnormality. PANCREAS: No acute abnormality. ADRENAL GLANDS: No acute abnormality. KIDNEYS, URETERS AND BLADDER: There are punctate nonobstructing bilateral renal calculi, left greater than right. No hydronephrosis. No perinephric or periureteral stranding. Urinary bladder is unremarkable. GI AND BOWEL: Stomach demonstrates no acute abnormality. There is some mild diffuse colonic wall thickening. There is descending and sigmoid colon diverticulosis. The appendix is within normal limits. There is no bowel obstruction. PERITONEUM AND RETROPERITONEUM: No ascites. No free air. VASCULATURE: Aorta is normal in caliber. There are atherosclerotic calcifications of the aorta. LYMPH NODES: No lymphadenopathy. REPRODUCTIVE ORGANS: Prostate gland is mildly enlarged. BONES AND SOFT TISSUES: There is chronic  compression deformity of the superior endplate of T11 and T12 which appears new from 2019. There are small fat-containing bilateral inguinal hernias. No focal soft tissue abnormality. IMPRESSION: 1. Mild diffuse colonic wall thickening worrisome for nonspecific colitis . 2. Findings suspicious for acute cholecystitis. 3. Descending and sigmoid colon diverticulosis without evidence of diverticulitis. 4. Punctate nonobstructing bilateral renal calculi, left greater than right. 5. Chronic compression deformities of the superior endplates of T11 and T12, new from 2019. correlate clinically. Electronically signed by: Greig Pique MD 03/13/2024 07:37 PM EDT RP Workstation: [id number]   DG Chest Portable 1 View Result Date: 03/13/2024 EXAM: 1 VIEW(S) XRAY OF THE CHEST 03/13/2024 04:50:00 PM COMPARISON: 12 slash 21 slash 23 CLINICAL HISTORY: near syncope FINDINGS: LUNGS AND PLEURA: No focal pulmonary opacity. No pulmonary edema. No pleural effusion. No pneumothorax. HEART AND MEDIASTINUM: No acute abnormality of the cardiac and mediastinal silhouettes. BONES AND SOFT TISSUES: Partially visible lower cervical spine ACDF hardware. No acute osseous abnormality. IMPRESSION: 1. No acute cardiopulmonary process . Electronically signed by: Norman Gatlin MD 03/13/2024 05:15 PM EDT RP Workstation: [id number]    There are no new results to review at this time.  Previous records (including but not limited to H&P, progress notes, nursing notes, TOC management) were reviewed in assessment of this patient.  Labs: CBC: Recent Labs  Lab 04/07/24 1951 04/08/24 0500 04/09/24 0933 04/10/24 0226  WBC 9.4 8.9 8.1 8.3  NEUTROABS 6.8  --   --   --   HGB 12.3* 11.4* 11.7* 11.0*  HCT 39.0 36.6* 36.0* 33.4*  MCV 101.6* 102.2* 98.1 97.7  PLT 305 291 288 271   Basic Metabolic Panel: Recent Labs  Lab 04/07/24 1951 04/08/24 0500 04/09/24 0303 04/09/24 0933 04/10/24 0226  NA 138 138  --  136 137  K 5.2* 4.6  --  4.0  4.3  CL 100 101  --  100 102  CO2 28 26  --  24 24  GLUCOSE 107* 116*  --  142* 152*  BUN 11 12  --  11 13  CREATININE 0.97 0.92  --  0.96 1.06  CALCIUM 10.1 9.6  --  9.2 8.6*  MG  --  1.6* 1.5*  --  2.0  PHOS  --  3.5  --   --   --    Liver Function Tests: Recent Labs  Lab 04/07/24 1951 04/09/24 0933 04/10/24 0226  AST 33 29 28  ALT 17 17 17   ALKPHOS 103 84 75  BILITOT 1.1 0.8 0.9  PROT 8.6* 7.9 7.2  ALBUMIN 3.4* 2.4* 2.1*   CBG: Recent Labs  Lab 04/09/24 1225 04/09/24 1824 04/09/24 2021 04/10/24 0746 04/10/24 1207  GLUCAP 138* 162* 152* 140* 111*    Scheduled Meds:  atorvastatin  80 mg Oral Daily   enoxaparin (LOVENOX) injection  40 mg Subcutaneous Q24H   feeding supplement  237 mL Oral BID BM   folic acid  1 mg Oral Daily   insulin  aspart  0-5 Units Subcutaneous QHS   insulin  aspart  0-9 Units Subcutaneous TID WC   metFORMIN  500 mg Oral Q breakfast   multivitamin with minerals  1 tablet Oral Daily   pregabalin  50 mg Oral BID   thiamine  100 mg Oral Daily   Continuous Infusions: PRN Meds:.acetaminophen  **OR** acetaminophen , bisacodyl, LORazepam, ondansetron  **OR** ondansetron  (ZOFRAN ) IV, senna-docusate  Family Communication: None at bedside  Disposition: Status is: Inpatient Remains inpatient appropriate because: Encephalopathy     Time spent: 35 minutes  Length of inpatient stay: 3 days  Author: Carliss LELON Canales, DO 04/10/2024 12:58 PM  For on call review [email or web address].

## 2024-04-10 NOTE — Hospital Course (Addendum)
 68 year old M with PMH of prior EtOH use in remission, DM-2, HTN, B12 deficiency, gout and peripheral polyneuropathy brought to ED from SNF with altered mental status, and admitted for acute encephalopathy.    Assessment and Plan:   Acute metabolic encephalopathy - Likely multifactorial etiology including polypharmacy, Warnicke encephalopathy, volume depletion, delirium.  Appears to be resolving.  CT/MRI brain negative.  Other lab workup unremarkable.  EtOH undetected.  Neurology signing off.  Showing improvement with high-dose IV thiamine.  Continuing p.o. thiamine.   History of alcohol use disorder - Reported last EtOH use was 3 weeks prior to hospitalization.  No noted withdrawal.   Diabetes mellitus - Insulin  sliding scale on board.   Hypomagnesemia - Supplementation initiated.  Will recheck magnesium and BMP in AM.   Right arm/wrist pain - Noted tender 11/12, near site of IV infiltration.  Mildly edematous with slight erythema.  Able to move distal digits, no tenderness in elbow or wrist.  Minimal tenderness in dorsal aspect of right forearm.  Will order ultrasound/x-ray of the wrist.  No known injury.  Will place on empiric Bactrim p.o. as well as give one-time Toradol  IV.   Folate deficiency - Supplementation on board.   Peripheral polyneuropathy - Lyrica reduced down to 100 mg twice daily.   Physical debilitation muscle weakness - PT/OT on board.   Goals of care - Recommending STR.  Patient [facility].  Awaiting auth.  Working closely with TOC.

## 2024-04-11 ENCOUNTER — Telehealth

## 2024-04-11 ENCOUNTER — Inpatient Hospital Stay (HOSPITAL_COMMUNITY)

## 2024-04-11 DIAGNOSIS — M7989 Other specified soft tissue disorders: Secondary | ICD-10-CM | POA: Diagnosis not present

## 2024-04-11 DIAGNOSIS — M25531 Pain in right wrist: Secondary | ICD-10-CM | POA: Diagnosis not present

## 2024-04-11 DIAGNOSIS — G9340 Encephalopathy, unspecified: Secondary | ICD-10-CM | POA: Diagnosis not present

## 2024-04-11 DIAGNOSIS — F1091 Alcohol use, unspecified, in remission: Secondary | ICD-10-CM | POA: Diagnosis not present

## 2024-04-11 DIAGNOSIS — E538 Deficiency of other specified B group vitamins: Secondary | ICD-10-CM | POA: Diagnosis not present

## 2024-04-11 LAB — GLUCOSE, CAPILLARY
Glucose-Capillary: 112 mg/dL — ABNORMAL HIGH (ref 70–99)
Glucose-Capillary: 148 mg/dL — ABNORMAL HIGH (ref 70–99)
Glucose-Capillary: 162 mg/dL — ABNORMAL HIGH (ref 70–99)
Glucose-Capillary: 119 mg/dL — ABNORMAL HIGH (ref 70–99)

## 2024-04-11 LAB — BASIC METABOLIC PANEL WITH GFR
Anion gap: 12 (ref 5–15)
BUN: 15 mg/dL (ref 8–23)
CO2: 23 mmol/L (ref 22–32)
Calcium: 8.9 mg/dL (ref 8.9–10.3)
Chloride: 101 mmol/L (ref 98–111)
Creatinine, Ser: 0.99 mg/dL (ref 0.61–1.24)
GFR, Estimated: >60 mL/min (ref >60)
Glucose, Bld: 119 mg/dL — ABNORMAL HIGH (ref 70–99)
Potassium: 4.4 mmol/L (ref 3.5–5.1)
Sodium: 136 mmol/L (ref 135–145)

## 2024-04-11 LAB — CBC
HCT: 34.7 % — ABNORMAL LOW (ref 39.0–52.0)
Hemoglobin: 11.4 g/dL — ABNORMAL LOW (ref 13.0–17.0)
MCH: 32.2 pg (ref 26.0–34.0)
MCHC: 32.9 g/dL (ref 30.0–36.0)
MCV: 98 fL (ref 80.0–100.0)
Platelets: 285 K/uL (ref 150–400)
RBC: 3.54 MIL/uL — ABNORMAL LOW (ref 4.22–5.81)
RDW: 16.2 % — ABNORMAL HIGH (ref 11.5–15.5)
WBC: 9.3 K/uL (ref 4.0–10.5)
nRBC: 0 % (ref 0.0–0.2)

## 2024-04-11 LAB — MAGNESIUM: Magnesium: 2 mg/dL (ref 1.7–2.4)

## 2024-04-11 LAB — PHOSPHORUS: Phosphorus: 3.9 mg/dL (ref 2.5–4.6)

## 2024-04-11 MED ORDER — TRAMADOL HCL 50 MG PO TABS
50.0000 mg | ORAL_TABLET | Freq: Four times a day (QID) | ORAL | Status: DC | PRN
  Administered 2024-04-12 – 2024-04-19 (×8): 50 mg via ORAL
  Filled 2024-04-11 (×8): qty 1

## 2024-04-11 MED ORDER — KETOROLAC TROMETHAMINE 15 MG/ML IJ SOLN: 15.0000 mg | Freq: Once | INTRAMUSCULAR | Status: AC

## 2024-04-11 MED ORDER — SULFAMETHOXAZOLE-TRIMETHOPRIM 800-160 MG PO TABS
1.0000 | ORAL_TABLET | Freq: Two times a day (BID) | ORAL | Status: AC
  Administered 2024-04-11 – 2024-04-15 (×10): 1 via ORAL
  Filled 2024-04-11 (×10): qty 1

## 2024-04-11 NOTE — TOC Initial Note (Addendum)
 Transition of Care [facility]) - Initial/Assessment Note    Patient Details  Name: Kenneth Joseph MRN: [id number] Date of Birth: January 07, 1956  [facility]) CM/SW Contact:    Sherline Clack, LCSWA Phone Number: 04/11/2024, 11:34 AM  Clinical Narrative:                  CSW submitted auth for patient to return to [facility]; transaction ID: [id number], ref ID: [id number].        Patient Goals and CMS Choice            Expected Discharge Plan and Services                                              Prior Living Arrangements/Services                       Activities of Daily Living      Permission Sought/Granted                  Emotional Assessment              Admission diagnosis:  Acute encephalopathy [G93.40] Altered mental status, unspecified altered mental status type [R41.82] Patient Active Problem List   Diagnosis Date Noted   Acute encephalopathy 04/07/2024   Altered mental status 04/07/2024   Generalized weakness 04/07/2024   Macrocytic anemia 04/07/2024   Alcohol-induced polyneuropathy 04/07/2024   Alcohol use disorder in remission 04/07/2024   Wernicke encephalopathy 04/07/2024   Colitis 03/14/2024   Alcohol abuse 03/14/2024   Elevated ferritin 02/19/2024   Low vitamin D level 02/19/2024   Abnormal level of blood mineral 02/19/2024   Current drinker of alcohol 02/19/2024   Diverticulosis of colon 02/19/2024   Gout 02/19/2024   Hardening of the aorta (main artery of the heart) 02/19/2024   Hypercholesterolemia 02/19/2024   Hypertension 02/19/2024   Hypocalcemia 02/19/2024   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   Left lower quadrant abdominal pain 02/19/2024   Loss of appetite 02/19/2024   Macrocytosis 02/19/2024   Peripheral polyneuropathy 02/19/2024   Diabetes (HCC) 02/19/2024   Type 2 diabetes mellitus with other specified complication (HCC) 02/19/2024   Type 2 diabetes  mellitus without complications (HCC) 02/19/2024   Folate deficiency 02/19/2024   PCP:  Claudene Pellet, MD Pharmacy:  No Pharmacies Listed    Social Drivers of Health (SDOH) Social History: SDOH Screenings   Food Insecurity: No Food Insecurity (04/08/2024)  Housing: Low Risk  (04/08/2024)  Transportation Needs: No Transportation Needs (04/08/2024)  Utilities: Not At Risk (04/08/2024)  Depression (PHQ2-9): Low Risk  (02/19/2024)  Social Connections: Socially Isolated (04/08/2024)  Tobacco Use: Low Risk  (03/14/2024)   SDOH Interventions:     Readmission Risk Interventions     No data to display

## 2024-04-11 NOTE — Progress Notes (Signed)
 Physical Therapy Treatment Patient Details Name: Kenneth Joseph MRN: [id number] DOB: Mar 16, 1956 Today's Date: 04/11/2024   History of Present Illness Pt is a 68 y.o. male presenting 11/9 to [facility] with AMS from [facility] rehab. MRI brain negative. Started on high dose thiamine. Recent admission in 10/15-23/2025 for colitis and acute neuropathic LE pain. PMH: DM , hiatal hernia, HTN, vitamin B 12 deficiency, alcohol use, peripheral neuropathy, gout    PT Comments  Pt received in supine and agreeable to session with encouragement. Pt requires increased assist for bed mobility this session due to pain. Noted edema in R hand with pt reporting significant pain with touch and mobility. Pt reports desire to eat breakfast, but has difficulty gripping utensils due to decreased L grip. Pt initially agreeable to standing trial with stedy and transfer to recliner, but declines once sitting EOB. Pt demonstrates quick fatigue and requests to return to supine. Pt continues to benefit from PT services to progress toward functional mobility goals.    If plan is discharge home, recommend the following: A lot of help with bathing/dressing/bathroom;A lot of help with walking and/or transfers;Assistance with cooking/housework;Assist for transportation;Help with stairs or ramp for entrance   Can travel by private vehicle     No  Equipment Recommendations  None recommended by PT    Recommendations for Other Services       Precautions / Restrictions Precautions Precautions: Fall Recall of Precautions/Restrictions: Intact Restrictions Weight Bearing Restrictions Per Provider Order: No     Mobility  Bed Mobility Overal bed mobility: Needs Assistance Bed Mobility: Supine to Sit, Sit to Supine     Supine to sit: Mod assist Sit to supine: Mod assist   General bed mobility comments: cues for sequencing and assist for BLE advancement and trunk elevation    Transfers                    General transfer comment: Pt declines standing due to BLE pain. Pt agreeable to attempt lateral scoot towards HOB (R), but requires max A  with bedpad due to pain    Ambulation/Gait                   Stairs             Wheelchair Mobility     Tilt Bed    Modified Rankin (Stroke Patients Only)       Balance Overall balance assessment: Needs assistance Sitting-balance support: Single extremity supported, No upper extremity supported, Feet supported Sitting balance-Leahy Scale: Poor Sitting balance - Comments: Pt tends to lean L onto L hand and forearm with fatigue       Standing balance comment: nt                            Communication Communication Communication: No apparent difficulties  Cognition Arousal: Alert Behavior During Therapy: WFL for tasks assessed/performed   PT - Cognitive impairments: Safety/Judgement, Problem solving, Awareness                         Following commands: Intact      Cueing Cueing Techniques: Verbal cues, Tactile cues, Other (comments)  Exercises General Exercises - Lower Extremity Long Arc Quad: AROM, Seated, Both, 5 reps    General Comments        Pertinent Vitals/Pain Pain Assessment Pain Assessment: Faces Faces Pain Scale: Hurts worst Pain Location: B  ankles and R hand Pain Descriptors / Indicators: Sore, Tightness, Tender Pain Intervention(s): Limited activity within patient's tolerance, Monitored during session, Repositioned, Patient requesting pain meds-RN notified     PT Goals (current goals can now be found in the care plan section) Acute Rehab PT Goals Patient Stated Goal: less pain so he can get around PT Goal Formulation: With patient Time For Goal Achievement: 04/23/24 Progress towards PT goals: Not progressing toward goals - comment (limited by pain)    Frequency    Min 2X/week       AM-PAC PT 6 Clicks Mobility   Outcome Measure  Help needed turning from  your back to your side while in a flat bed without using bedrails?: A Little Help needed moving from lying on your back to sitting on the side of a flat bed without using bedrails?: A Lot Help needed moving to and from a bed to a chair (including a wheelchair)?: Total Help needed standing up from a chair using your arms (e.g., wheelchair or bedside chair)?: Total Help needed to walk in hospital room?: Total Help needed climbing 3-5 steps with a railing? : Total 6 Click Score: 9    End of Session   Activity Tolerance: Patient limited by pain Patient left: in bed;with call bell/phone within reach;with bed alarm set Nurse Communication: Mobility status PT Visit Diagnosis: Unsteadiness on feet (R26.81);Muscle weakness (generalized) (M62.81);Difficulty in walking, not elsewhere classified (R26.2);Pain;Other symptoms and signs involving the nervous system (R29.898) Pain - Right/Left:  (Bilateral) Pain - part of body: Ankle and joints of foot     Time: 0926-0950 PT Time Calculation (min) (ACUTE ONLY): 24 min  Charges:    $Therapeutic Activity: 23-37 mins PT General Charges $$ ACUTE PT VISIT: 1 Visit                    Darryle George, PTA Acute Rehabilitation Services Secure Chat Preferred  [phone]    Darryle George 04/11/2024, 10:23 AM

## 2024-04-11 NOTE — TOC Progression Note (Signed)
 Transition of Care [facility]) - Progression Note    Patient Details  Name: Kenneth Joseph MRN: [id number] Date of Birth: March 18, 1956  [facility] of [facility] CM/SW Contact  Sherline Clack, [place] Phone Number: 04/11/2024, 4:35 PM  Clinical Narrative:     CSW spoke with patient's daughter, Diane, regarding patient's return to [facility]. Family prefers patient discharge to another SNF and requested CSW send patient's information to other facilities. FL2 filled out and faxed to facilities for review. [facility] updated. CSW will continue to follow as needs arise. CSW to void pending insurance auth for [facility].                     Expected Discharge Plan and Services                                               Social Drivers of Health (SDOH) Interventions SDOH Screenings   Food Insecurity: No Food Insecurity (04/08/2024)  Housing: Low Risk  (04/08/2024)  Transportation Needs: No Transportation Needs (04/08/2024)  Utilities: Not At Risk (04/08/2024)  Depression (PHQ2-9): Low Risk  (02/19/2024)  Social Connections: Socially Isolated (04/08/2024)  Tobacco Use: Low Risk  (03/14/2024)    Readmission Risk Interventions     No data to display

## 2024-04-11 NOTE — Progress Notes (Signed)
 Progress Note   Patient: Kenneth Joseph FMW:[id number] DOB: [birth year] DOA: 04/07/2024  DOS: the patient was seen and examined on 04/11/2024   Brief hospital course:  68 year old M with PMH of prior EtOH use in remission, DM-2, HTN, B12 deficiency, gout and peripheral polyneuropathy brought to ED from SNF with altered mental status, and admitted for acute encephalopathy.   Assessment and Plan:  Acute metabolic encephalopathy - Likely multifactorial etiology including polypharmacy, Warnicke encephalopathy, volume depletion, delirium.  Appears to be resolving.  CT/MRI brain negative.  Other lab workup unremarkable.  EtOH undetected.  Neurology signing off.  Showing improvement with high-dose IV thiamine.  Continuing p.o. thiamine.  History of alcohol use disorder - Reported last EtOH use was 3 weeks prior to hospitalization.  No noted withdrawal.  Diabetes mellitus - Insulin  sliding scale on board.  Hypomagnesemia - Supplementation initiated.  Will recheck magnesium and BMP in AM.  Right arm/wrist pain - Noted tender 11/12, near site of IV infiltration.  Mildly edematous with slight erythema.  Able to move distal digits, no tenderness in elbow or wrist.  Minimal tenderness in dorsal aspect of right forearm.  Will order ultrasound/x-ray of the wrist.  No known injury.  Will place on empiric Bactrim p.o. as well as give one-time Toradol  IV.  Folate deficiency - Supplementation on board.  Peripheral polyneuropathy - Lyrica reduced down to 100 mg twice daily.  Physical debilitation muscle weakness - PT/OT on board.  Goals of care - Recommending STR.  Patient [facility]  Awaiting auth.  Working closely with TOC.   Subjective: Patient resting comfortably this morning.  Still complaining of tender right forearm.  No fevers, shortness of breath, chest pain, nausea, vomiting, abdominal pain.  No problems moving fingers.  Alert and appearing well this morning.  Physical  Exam:  Vitals:   04/10/24 1630 04/11/24 0504 04/11/24 0727 04/11/24 1148  BP: 130/73 (!) 133/94 128/80 117/75  Pulse: 98 87 86 97  Resp:   19 18  Temp:  97.9 F (36.6 C) 99.8 F (37.7 C) 98.1 F (36.7 C)  TempSrc:      SpO2: 90% 92% 95% (!) 88%    GENERAL:  Alert, pleasant, disheveled HEENT:  EOMI CARDIOVASCULAR:  RRR, no murmurs appreciated RESPIRATORY:  Clear to auscultation, no wheezing, rales, or rhonchi GASTROINTESTINAL:  Soft, nontender, nondistended EXTREMITIES: Tender right wrist with mild edema and erythema dorsally NEURO: no new focal deficits appreciated SKIN:  No rashes noted PSYCH:  Appropriate mood and affect    Data Reviewed:  Imaging Studies: VAS US  UPPER EXTREMITY VENOUS DUPLEX Result Date: 04/11/2024 UPPER VENOUS STUDY  Patient Name:  Kenneth Joseph  Date of Exam:   04/11/2024 Medical Rec #: [id number]        Accession #:    [id number] Date of Birth: May 08, 1956        Patient Gender: M Patient Age:   50 years Exam Location:  [facility] Procedure:      VAS US  UPPER EXTREMITY VENOUS DUPLEX Referring Phys: TIMOTHY OPYD --------------------------------------------------------------------------------  Risk Factors: None identified. Limitations: Poor ultrasound/tissue interface and patient positioning, patient pain tolerance. Comparison Study: No prior studies. Performing Technologist: Cordella Collet RVT  Examination Guidelines: A complete evaluation includes B-mode imaging, spectral Doppler, color Doppler, and power Doppler as needed of all accessible portions of each vessel. Bilateral testing is considered an integral part of a complete examination. Limited examinations for reoccurring indications may be performed as noted.  Right Findings: +----------+------------+---------+-----------+----------+-------+ RIGHT  CompressiblePhasicitySpontaneousPropertiesSummary +----------+------------+---------+-----------+----------+-------+ IJV           Full        Yes       Yes                      +----------+------------+---------+-----------+----------+-------+ Subclavian               Yes       Yes                      +----------+------------+---------+-----------+----------+-------+ Axillary      Full       Yes       Yes                      +----------+------------+---------+-----------+----------+-------+ Brachial      Full                                          +----------+------------+---------+-----------+----------+-------+ Radial        Full                                          +----------+------------+---------+-----------+----------+-------+ Ulnar         Full                                          +----------+------------+---------+-----------+----------+-------+ Cephalic      Full                                          +----------+------------+---------+-----------+----------+-------+ Basilic       Full                                          +----------+------------+---------+-----------+----------+-------+  Left Findings: +----------+------------+---------+-----------+----------+-------+ LEFT      CompressiblePhasicitySpontaneousPropertiesSummary +----------+------------+---------+-----------+----------+-------+ Subclavian               Yes       Yes                      +----------+------------+---------+-----------+----------+-------+  Summary:  Right: No evidence of deep vein thrombosis in the upper extremity. No evidence of superficial vein thrombosis in the upper extremity.  Left: No evidence of thrombosis in the subclavian.  *See table(s) above for measurements and observations.  Diagnosing physician: Debby Robertson Electronically signed by Debby Robertson on 04/11/2024 at 12:08:23 PM.    Final    MR Brain W and Wo Contrast Result Date: 04/08/2024 EXAM: MRI BRAIN WITH AND WITHOUT CONTRAST 04/08/2024 09:02:58 AM TECHNIQUE: Multiplanar multisequence MRI of the head/brain  was performed with and without the administration of 7.5 mL of gadobutrol (GADAVIST) 1 MMOL/ML intravenous contrast. COMPARISON: Head CT 04/07/2024. CLINICAL HISTORY: 68 year old male. Weakness and confusion. FINDINGS: BRAIN AND VENTRICLES: No acute infarct. No cortical encephalomalacia or chronic cerebral blood products. Minimal to mild for age nonspecific cerebral white matter signal changes. No acute intracranial hemorrhage. No mass effect  or midline shift. No hydrocephalus. The sella is unremarkable. Normal flow voids. Following contrast the major dural venous sinuses are enhancing and appear to be patent. No abnormal intracranial enhancement. No dural thickening. ORBITS: No acute abnormality. SINUSES: Mild paranasal sinus mucosal thickening and left maxillary retention cyst. Mastoids are clear. Grossly normal visible internal auditory structures. BONES AND SOFT TISSUES: Negative for age of visible cervical spine. Normal bone marrow signal and enhancement. No acute soft tissue abnormality. LIMITATIONS/ARTIFACTS: Significant bowel gas outflow. IMPRESSION: 1. No acute intracranial abnormality. Negative for age MRI appearance of the brain. Electronically signed by: Helayne Hurst MD 04/08/2024 09:54 AM EST RP Workstation: [id number]   CT HEAD WO CONTRAST ( ) Result Date: 04/07/2024 EXAM: CT HEAD WITHOUT CONTRAST 04/07/2024 08:10:24 PM TECHNIQUE: CT of the head was performed without the administration of intravenous contrast. Automated exposure control, iterative reconstruction, and/or weight based adjustment of the mA/kV was utilized to reduce the radiation dose to as low as reasonably achievable. COMPARISON: CT head 11/22/2023. CLINICAL HISTORY: Mental status change, unknown cause. FINDINGS: BRAIN AND VENTRICLES: No acute hemorrhage. No evidence of acute infarct. No hydrocephalus. No extra-axial collection. No mass effect or midline shift. Atherosclerotic calcifications are present within the cavernous internal  carotid arteries. Patchy and confluent areas of decreased attenuation are noted throughout the deep and periventricular white matter of the cerebral hemispheres bilaterally, suggestive of chronic microvascular ischemic changes. ORBITS: Bilateral lens replacement. SINUSES: No acute abnormality. SOFT TISSUES AND SKULL: No acute soft tissue abnormality. No skull fracture. IMPRESSION: 1. No acute intracranial abnormality. Electronically signed by: Morgane Naveau MD 04/07/2024 08:15 PM EST RP Workstation: [id number]   DG Chest Port 1 View Result Date: 04/07/2024 EXAM: 1 VIEW(S) XRAY OF THE CHEST 04/07/2024 07:18:00 PM COMPARISON: Chest x-ray 03/18/2023. CLINICAL HISTORY: AMS. FINDINGS: LUNGS AND PLEURA: No focal pulmonary opacity. No pulmonary edema. No pleural effusion. No pneumothorax. HEART AND MEDIASTINUM: No acute abnormality of the cardiac and mediastinal silhouettes. BONES AND SOFT TISSUES: No acute osseous abnormality. IMPRESSION: 1. No acute proces Electronically signed by: Greig Pique MD 04/07/2024 07:25 PM EST RP Workstation: [id number]   VAS US  LOWER EXTREMITY VENOUS (DVT) Result Date: 03/18/2024  Lower Venous DVT Study Patient Name:  ZUHAIR LARICCIA  Date of Exam:   03/17/2024 Medical Rec #: [id number]        Accession #:    [id number] Date of Birth: 25-May-1956        Patient Gender: M Patient Age:   6 years Exam Location:  [facility] Procedure:      VAS US  LOWER EXTREMITY VENOUS (DVT) Referring Phys: JESSICA VANN --------------------------------------------------------------------------------  Indications: Worsening lower extremity pain. 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  Comparison Study: No prior study on file Performing Technologist: Alberta Lis RVS  Examination Guidelines: A complete evaluation includes B-mode imaging, spectral Doppler, color Doppler, and power Doppler as needed of all accessible portions of each vessel. Bilateral testing is  considered an integral part of a complete examination. Limited examinations for reoccurring indications may be performed as noted. The reflux portion of the exam is performed with the patient in reverse Trendelenburg.  +---------+---------------+---------+-----------+----------+--------------+ RIGHT    CompressibilityPhasicitySpontaneityPropertiesThrombus Aging +---------+---------------+---------+-----------+----------+--------------+ CFV      Full           Yes      Yes                                 +---------+---------------+---------+-----------+----------+--------------+  SFJ      Full                                                        +---------+---------------+---------+-----------+----------+--------------+ FV Prox  Full           Yes      Yes                                 +---------+---------------+---------+-----------+----------+--------------+ FV Mid   Full                                                        +---------+---------------+---------+-----------+----------+--------------+ FV DistalFull                                                        +---------+---------------+---------+-----------+----------+--------------+ PFV      Full                                                        +---------+---------------+---------+-----------+----------+--------------+ POP      Full           Yes      Yes                                 +---------+---------------+---------+-----------+----------+--------------+ PTV      Full                                                        +---------+---------------+---------+-----------+----------+--------------+ PERO     Full                                                        +---------+---------------+---------+-----------+----------+--------------+   +---------+---------------+---------+-----------+----------+--------------+ LEFT      CompressibilityPhasicitySpontaneityPropertiesThrombus Aging +---------+---------------+---------+-----------+----------+--------------+ CFV      Full           Yes      Yes                                 +---------+---------------+---------+-----------+----------+--------------+ SFJ      Full                                                        +---------+---------------+---------+-----------+----------+--------------+  FV Prox  Full                                                        +---------+---------------+---------+-----------+----------+--------------+ FV Mid   Full                                                        +---------+---------------+---------+-----------+----------+--------------+ FV DistalFull                                                        +---------+---------------+---------+-----------+----------+--------------+ PFV      Full                                                        +---------+---------------+---------+-----------+----------+--------------+ POP      Full           Yes      Yes                                 +---------+---------------+---------+-----------+----------+--------------+ PTV      Full                                                        +---------+---------------+---------+-----------+----------+--------------+ PERO     Full                                                        +---------+---------------+---------+-----------+----------+--------------+     Summary: BILATERAL: - No evidence of deep vein thrombosis seen in the lower extremities, bilaterally. -No evidence of popliteal cyst, bilaterally.   *See table(s) above for measurements and observations. Electronically signed by Lonni Gaskins MD on 03/18/2024 at 1:16:22 PM.    Final    DG CHEST PORT 1 VIEW Result Date: 03/17/2024 CLINICAL DATA:  Fever. EXAM: PORTABLE CHEST 1 VIEW COMPARISON:  03/13/2024 FINDINGS: Lungs are  adequately inflated and otherwise clear. Cardiomediastinal silhouette and remainder of the exam is unchanged. IMPRESSION: No active disease. Electronically Signed   By: Toribio Agreste M.D.   On: 03/17/2024 10:46   US  Abdomen Limited RUQ (LIVER/GB) Result Date: 03/14/2024 EXAM: Right Upper Quadrant Abdominal Ultrasound 03/14/2024 04:56:00 AM TECHNIQUE: Real-time ultrasonography of the right upper quadrant of the abdomen was performed. COMPARISON: CT of the abdomen and pelvis dated 03/13/2024. CLINICAL HISTORY: 6194 Acute cholecystitis 6194. Acute cholecystitis 6194. FINDINGS: LIVER: The liver is diffusely echogenic suggesting underlying steatosis. No intrahepatic biliary ductal dilatation. No evidence of  mass. There is normal hepatopetal blood flow within the portal vein. BILIARY SYSTEM: No pericholecystic fluid. The gallbladder wall measures 4 mm in thickness. No cholelithiasis. The patient is not focally tender over the gallbladder. Common bile duct is within normal limits measuring 5 mm. RIGHT KIDNEY: The right kidney is grossly unremarkable in appearances without evidence of hydronephrosis, echogenic calculi or worrisome mass lesions. OTHER: No right upper quadrant ascites. IMPRESSION: 1. No sonographic evidence of acute cholecystitis. 2. Hepatic steatosis. Electronically signed by: Evalene Coho MD 03/14/2024 05:05 AM EDT RP Workstation: [id number]   CT ABDOMEN PELVIS W CONTRAST Result Date: 03/13/2024 EXAM: CT ABDOMEN AND PELVIS WITH CONTRAST 03/13/2024 07:19:36 PM TECHNIQUE: CT of the abdomen and pelvis was performed with the administration of 75 mL of iohexol (OMNIPAQUE) 350 MG/ML injection. Multiplanar reformatted images are provided for review. Automated exposure control, iterative reconstruction, and/or weight-based adjustment of the mA/kV was utilized to reduce the radiation dose to as low as reasonably achievable. COMPARISON: CT abdomen and pelvis 03/14/2018. CLINICAL HISTORY: Diarrhea.  Hypotension; Diarrhea x several days; Hx of hep C; 75 ml omni 350 FINDINGS: LOWER CHEST: No acute abnormality. LIVER: The liver is unremarkable. GALLBLADDER AND BILE DUCTS: There is trace inflammatory stranding surrounding the gallbladder. The gallbladder is distended. No biliary ductal dilatation. SPLEEN: No acute abnormality. PANCREAS: No acute abnormality. ADRENAL GLANDS: No acute abnormality. KIDNEYS, URETERS AND BLADDER: There are punctate nonobstructing bilateral renal calculi, left greater than right. No hydronephrosis. No perinephric or periureteral stranding. Urinary bladder is unremarkable. GI AND BOWEL: Stomach demonstrates no acute abnormality. There is some mild diffuse colonic wall thickening. There is descending and sigmoid colon diverticulosis. The appendix is within normal limits. There is no bowel obstruction. PERITONEUM AND RETROPERITONEUM: No ascites. No free air. VASCULATURE: Aorta is normal in caliber. There are atherosclerotic calcifications of the aorta. LYMPH NODES: No lymphadenopathy. REPRODUCTIVE ORGANS: Prostate gland is mildly enlarged. BONES AND SOFT TISSUES: There is chronic compression deformity of the superior endplate of T11 and T12 which appears new from 2019. There are small fat-containing bilateral inguinal hernias. No focal soft tissue abnormality. IMPRESSION: 1. Mild diffuse colonic wall thickening worrisome for nonspecific colitis . 2. Findings suspicious for acute cholecystitis. 3. Descending and sigmoid colon diverticulosis without evidence of diverticulitis. 4. Punctate nonobstructing bilateral renal calculi, left greater than right. 5. Chronic compression deformities of the superior endplates of T11 and T12, new from 2019. correlate clinically. Electronically signed by: Greig Pique MD 03/13/2024 07:37 PM EDT RP Workstation: [id number]   DG Chest Portable 1 View Result Date: 03/13/2024 EXAM: 1 VIEW(S) XRAY OF THE CHEST 03/13/2024 04:50:00 PM COMPARISON: 12 slash 21  slash 23 CLINICAL HISTORY: near syncope FINDINGS: LUNGS AND PLEURA: No focal pulmonary opacity. No pulmonary edema. No pleural effusion. No pneumothorax. HEART AND MEDIASTINUM: No acute abnormality of the cardiac and mediastinal silhouettes. BONES AND SOFT TISSUES: Partially visible lower cervical spine ACDF hardware. No acute osseous abnormality. IMPRESSION: 1. No acute cardiopulmonary process . Electronically signed by: Norman Gatlin MD 03/13/2024 05:15 PM EDT RP Workstation: [id number]    There are no new results to review at this time.  Previous records (including but not limited to H&P, progress notes, nursing notes, TOC management) were reviewed in assessment of this patient.  Labs: CBC: Recent Labs  Lab 04/07/24 1951 04/08/24 0500 04/09/24 0933 04/10/24 0226 04/11/24 0455  WBC 9.4 8.9 8.1 8.3 9.3  NEUTROABS 6.8  --   --   --   --   HGB 12.3* 11.4*  11.7* 11.0* 11.4*  HCT 39.0 36.6* 36.0* 33.4* 34.7*  MCV 101.6* 102.2* 98.1 97.7 98.0  PLT 305 291 288 271 285   Basic Metabolic Panel: Recent Labs  Lab 04/07/24 1951 04/08/24 0500 04/09/24 0303 04/09/24 0933 04/10/24 0226 04/11/24 0455  NA 138 138  --  136 137 136  K 5.2* 4.6  --  4.0 4.3 4.4  CL 100 101  --  100 102 101  CO2 28 26  --  24 24 23   GLUCOSE 107* 116*  --  142* 152* 119*  BUN 11 12  --  11 13 15   CREATININE 0.97 0.92  --  0.96 1.06 0.99  CALCIUM 10.1 9.6  --  9.2 8.6* 8.9  MG  --  1.6* 1.5*  --  2.0 2.0  PHOS  --  3.5  --   --   --  3.9   Liver Function Tests: Recent Labs  Lab 04/07/24 1951 04/09/24 0933 04/10/24 0226  AST 33 29 28  ALT 17 17 17   ALKPHOS 103 84 75  BILITOT 1.1 0.8 0.9  PROT 8.6* 7.9 7.2  ALBUMIN 3.4* 2.4* 2.1*   CBG: Recent Labs  Lab 04/10/24 1207 04/10/24 1631 04/10/24 2046 04/11/24 0727 04/11/24 1147  GLUCAP 111* 179* 107* 112* 148*    Scheduled Meds:  atorvastatin  80 mg Oral Daily   enoxaparin (LOVENOX) injection  40 mg Subcutaneous Q24H   feeding supplement   237 mL Oral BID BM   folic acid  1 mg Oral Daily   insulin  aspart  0-5 Units Subcutaneous QHS   insulin  aspart  0-9 Units Subcutaneous TID WC   ketorolac   15 mg Intravenous Once   metFORMIN  500 mg Oral Q breakfast   multivitamin with minerals  1 tablet Oral Daily   pregabalin  50 mg Oral BID   sulfamethoxazole-trimethoprim  1 tablet Oral Q12H   thiamine  100 mg Oral Daily   Continuous Infusions: PRN Meds:.acetaminophen  **OR** acetaminophen , bisacodyl, LORazepam, ondansetron  **OR** ondansetron  (ZOFRAN ) IV, senna-docusate  Family Communication: None at bedside  Disposition: Status is: Inpatient Remains inpatient appropriate because: Encephalopathy     Time spent: 36 minutes  Length of inpatient stay: 4 days  Author: Carliss LELON Canales, DO 04/11/2024 12:53 PM  For on call review [email or web address].

## 2024-04-11 NOTE — Plan of Care (Signed)

## 2024-04-11 NOTE — Progress Notes (Signed)
 Right upper extremity venous duplex has been completed. Preliminary results can be found in CV Proc through chart review.   04/11/24 10:27 AM Cathlyn Collet RVT

## 2024-04-12 DIAGNOSIS — G9340 Encephalopathy, unspecified: Secondary | ICD-10-CM | POA: Diagnosis not present

## 2024-04-12 LAB — GLUCOSE, CAPILLARY
Glucose-Capillary: 106 mg/dL — ABNORMAL HIGH (ref 70–99)
Glucose-Capillary: 118 mg/dL — ABNORMAL HIGH (ref 70–99)
Glucose-Capillary: 121 mg/dL — ABNORMAL HIGH (ref 70–99)
Glucose-Capillary: 131 mg/dL — ABNORMAL HIGH (ref 70–99)

## 2024-04-12 LAB — BASIC METABOLIC PANEL WITH GFR
Anion gap: 12 (ref 5–15)
BUN: 15 mg/dL (ref 8–23)
CO2: 24 mmol/L (ref 22–32)
Calcium: 8.7 mg/dL — ABNORMAL LOW (ref 8.9–10.3)
Chloride: 100 mmol/L (ref 98–111)
Creatinine, Ser: 0.99 mg/dL (ref 0.61–1.24)
GFR, Estimated: >60 mL/min (ref >60)
Glucose, Bld: 107 mg/dL — ABNORMAL HIGH (ref 70–99)
Potassium: 4.3 mmol/L (ref 3.5–5.1)
Sodium: 136 mmol/L (ref 135–145)

## 2024-04-12 LAB — CBC
HCT: 33.3 % — ABNORMAL LOW (ref 39.0–52.0)
Hemoglobin: 10.9 g/dL — ABNORMAL LOW (ref 13.0–17.0)
MCH: 31.6 pg (ref 26.0–34.0)
MCHC: 32.7 g/dL (ref 30.0–36.0)
MCV: 96.5 fL (ref 80.0–100.0)
Platelets: 288 K/uL (ref 150–400)
RBC: 3.45 MIL/uL — ABNORMAL LOW (ref 4.22–5.81)
RDW: 16.4 % — ABNORMAL HIGH (ref 11.5–15.5)
WBC: 7.8 K/uL (ref 4.0–10.5)
nRBC: 0 % (ref 0.0–0.2)

## 2024-04-12 NOTE — Progress Notes (Signed)
 Progress Note   Patient: Kenneth Joseph FMW:[id number] DOB: [birth year] DOA: 04/07/2024  DOS: the patient was seen and examined on 04/12/2024   Brief hospital course: Patient is a 68 year old male past medical history for alcohol abuse in remission, DM-2, HTN, B12 deficiency, gout and peripheral polyneuropathy.  Patient was brought to ED from SNF with altered mental status, and admitted for acute encephalopathy.   04/12/2024: No new changes.  Patient is awaiting SNF.  Right upper extremity/wrist area remains relatively swollen.  Assessment and Plan:  Acute metabolic encephalopathy: - Likely multifactorial etiology including polypharmacy, Warnicke encephalopathy, volume depletion, delirium.  Appears to be resolving.  CT/MRI brain negative.  Other lab workup unremarkable.  EtOH undetected.  Neurology signing off.  Showing improvement with high-dose IV thiamine.  Continuing p.o. thiamine. 04/12/2024: Pursue SNF.  Encephalopathy has resolved significantly.  History of alcohol use disorder: - Reported last EtOH use was 3 weeks prior to hospitalization.  No noted withdrawal.  Diabetes mellitus: - Continue sliding scale insulin  coverage.  - Continue metformin.  Hypomagnesemia: - Last magnesium was 2.  Right arm/wrist pain: - Noted tender 11/12, near site of IV infiltration.  Mildly edematous with slight erythema.  Able to move distal digits, no tenderness in elbow or wrist.  Minimal tenderness in dorsal aspect of right forearm.  Will order ultrasound/x-ray of the wrist.  No known injury.  Will place on empiric Bactrim p.o. as well as give one-time Toradol  IV.  Folate deficiency: - Continue supplements.    Peripheral polyneuropathy: - Continue Lyrica.    Physical debilitation muscle weakness: - Awaiting SNF.  Goals of care - Recommending STR.  Patient [facility].  Awaiting auth.  Working closely with TOC.   Subjective: No new complaints.  Physical Exam:  Vitals:   04/11/24  2101 04/12/24 0116 04/12/24 0747 04/12/24 1118  BP: 135/65 123/83 115/82 111/76  Pulse: 88 88 80 84  Resp:   20 18  Temp: 99.2 F (37.3 C) 99.5 F (37.5 C) 97.7 F (36.5 C) 98.8 F (37.1 C)  TempSrc:    Oral  SpO2: 91% 93% 92% 92%    GENERAL: Not in any distress.  Awake and alert  HEENT: Patient is pale.   CARDIOVASCULAR: S1-S2. RESPIRATORY:  Clear to auscultation. GASTROINTESTINAL: Soft and nontender. EXTREMITIES: Wrist area/right upper extremity remains swollen. NEURO: Awake and alert.  Data Reviewed:  Imaging Studies: DG Wrist 2 Views Right Result Date: 04/11/2024 CLINICAL DATA:  Right wrist pain. EXAM: RIGHT WRIST - 2 VIEW COMPARISON:  None Available. FINDINGS: No acute fracture or dislocation. The bones are mildly osteopenic. No significant arthritic changes. There is mild subcutaneous edema. No radiopaque foreign object or soft tissue gas. IMPRESSION: 1. No acute fracture or dislocation. 2. Mild subcutaneous edema. Electronically Signed   By: Vanetta Chou M.D.   On: 04/11/2024 18:36   VAS US  UPPER EXTREMITY VENOUS DUPLEX Result Date: 04/11/2024 UPPER VENOUS STUDY  Patient Name:  Kenneth Joseph  Date of Exam:   04/11/2024 Medical Rec #: [id number]        Accession #:    [id number] Date of Birth: [birth year]        Patient Gender: M Patient Age:   81 years Exam Location:  [facility] Procedure:      VAS US  UPPER EXTREMITY VENOUS DUPLEX Referring Phys: TIMOTHY OPYD --------------------------------------------------------------------------------  Risk Factors: None identified. Limitations: Poor ultrasound/tissue interface and patient positioning, patient pain tolerance. Comparison Study: No prior studies. Performing Technologist: Cordella Collet RVT  Examination  Guidelines: A complete evaluation includes B-mode imaging, spectral Doppler, color Doppler, and power Doppler as needed of all accessible portions of each vessel. Bilateral testing is considered an integral part of a  complete examination. Limited examinations for reoccurring indications may be performed as noted.  Right Findings: +----------+------------+---------+-----------+----------+-------+ RIGHT     CompressiblePhasicitySpontaneousPropertiesSummary +----------+------------+---------+-----------+----------+-------+ IJV           Full       Yes       Yes                      +----------+------------+---------+-----------+----------+-------+ Subclavian               Yes       Yes                      +----------+------------+---------+-----------+----------+-------+ Axillary      Full       Yes       Yes                      +----------+------------+---------+-----------+----------+-------+ Brachial      Full                                          +----------+------------+---------+-----------+----------+-------+ Radial        Full                                          +----------+------------+---------+-----------+----------+-------+ Ulnar         Full                                          +----------+------------+---------+-----------+----------+-------+ Cephalic      Full                                          +----------+------------+---------+-----------+----------+-------+ Basilic       Full                                          +----------+------------+---------+-----------+----------+-------+  Left Findings: +----------+------------+---------+-----------+----------+-------+ LEFT      CompressiblePhasicitySpontaneousPropertiesSummary +----------+------------+---------+-----------+----------+-------+ Subclavian               Yes       Yes                      +----------+------------+---------+-----------+----------+-------+  Summary:  Right: No evidence of deep vein thrombosis in the upper extremity. No evidence of superficial vein thrombosis in the upper extremity.  Left: No evidence of thrombosis in the subclavian.  *See  table(s) above for measurements and observations.  Diagnosing physician: Debby Robertson Electronically signed by Debby Robertson on 04/11/2024 at 12:08:23 PM.    Final    MR Brain W and Wo Contrast Result Date: 04/08/2024 EXAM: MRI BRAIN WITH AND WITHOUT CONTRAST 04/08/2024 09:02:58 AM TECHNIQUE: Multiplanar multisequence MRI of the head/brain was performed with and without the administration of 7.5 mL  of gadobutrol (GADAVIST) 1 MMOL/ML intravenous contrast. COMPARISON: Head CT 04/07/2024. CLINICAL HISTORY: 68 year old male. Weakness and confusion. FINDINGS: BRAIN AND VENTRICLES: No acute infarct. No cortical encephalomalacia or chronic cerebral blood products. Minimal to mild for age nonspecific cerebral white matter signal changes. No acute intracranial hemorrhage. No mass effect or midline shift. No hydrocephalus. The sella is unremarkable. Normal flow voids. Following contrast the major dural venous sinuses are enhancing and appear to be patent. No abnormal intracranial enhancement. No dural thickening. ORBITS: No acute abnormality. SINUSES: Mild paranasal sinus mucosal thickening and left maxillary retention cyst. Mastoids are clear. Grossly normal visible internal auditory structures. BONES AND SOFT TISSUES: Negative for age of visible cervical spine. Normal bone marrow signal and enhancement. No acute soft tissue abnormality. LIMITATIONS/ARTIFACTS: Significant bowel gas outflow. IMPRESSION: 1. No acute intracranial abnormality. Negative for age MRI appearance of the brain. Electronically signed by: Helayne Hurst MD 04/08/2024 09:54 AM EST RP Workstation: [id number]   CT HEAD WO CONTRAST ( ) Result Date: 04/07/2024 EXAM: CT HEAD WITHOUT CONTRAST 04/07/2024 08:10:24 PM TECHNIQUE: CT of the head was performed without the administration of intravenous contrast. Automated exposure control, iterative reconstruction, and/or weight based adjustment of the mA/kV was utilized to reduce the radiation dose to as low  as reasonably achievable. COMPARISON: CT head 11/22/2023. CLINICAL HISTORY: Mental status change, unknown cause. FINDINGS: BRAIN AND VENTRICLES: No acute hemorrhage. No evidence of acute infarct. No hydrocephalus. No extra-axial collection. No mass effect or midline shift. Atherosclerotic calcifications are present within the cavernous internal carotid arteries. Patchy and confluent areas of decreased attenuation are noted throughout the deep and periventricular white matter of the cerebral hemispheres bilaterally, suggestive of chronic microvascular ischemic changes. ORBITS: Bilateral lens replacement. SINUSES: No acute abnormality. SOFT TISSUES AND SKULL: No acute soft tissue abnormality. No skull fracture. IMPRESSION: 1. No acute intracranial abnormality. Electronically signed by: Morgane Naveau MD 04/07/2024 08:15 PM EST RP Workstation: [id number]   DG Chest Port 1 View Result Date: 04/07/2024 EXAM: 1 VIEW(S) XRAY OF THE CHEST 04/07/2024 07:18:00 PM COMPARISON: Chest x-ray 03/18/2023. CLINICAL HISTORY: AMS. FINDINGS: LUNGS AND PLEURA: No focal pulmonary opacity. No pulmonary edema. No pleural effusion. No pneumothorax. HEART AND MEDIASTINUM: No acute abnormality of the cardiac and mediastinal silhouettes. BONES AND SOFT TISSUES: No acute osseous abnormality. IMPRESSION: 1. No acute proces Electronically signed by: Greig Pique MD 04/07/2024 07:25 PM EST RP Workstation: [id number]   VAS US  LOWER EXTREMITY VENOUS (DVT) Result Date: 03/18/2024  Lower Venous DVT Study Patient Name:  CHINO SARDO  Date of Exam:   03/17/2024 Medical Rec #: [id number]        Accession #:    [id number] Date of Birth: November 21, 1955        Patient Gender: M Patient Age:   50 years Exam [facility]  [facility] Procedure:      VAS US  LOWER EXTREMITY VENOUS (DVT) Referring Phys: JESSICA VANN --------------------------------------------------------------------------------  Indications: Worsening lower extremity pain. Chronic pain  of legs and feet, likely neuropathy from alcohol abuse.  Comparison Study: No prior study on file Performing Technologist: Alberta Lis RVS  Examination Guidelines: A complete evaluation includes B-mode imaging, spectral Doppler, color Doppler, and power Doppler as needed of all accessible portions of each vessel. Bilateral testing is considered an integral part of a complete examination. Limited examinations for reoccurring indications may be performed as noted. The reflux portion of the exam is performed with the patient in reverse Trendelenburg.  +---------+---------------+---------+-----------+----------+--------------+ RIGHT    CompressibilityPhasicitySpontaneityPropertiesThrombus Aging +---------+---------------+---------+-----------+----------+--------------+  CFV      Full           Yes      Yes                                 +---------+---------------+---------+-----------+----------+--------------+ SFJ      Full                                                        +---------+---------------+---------+-----------+----------+--------------+ FV Prox  Full           Yes      Yes                                 +---------+---------------+---------+-----------+----------+--------------+ FV Mid   Full                                                        +---------+---------------+---------+-----------+----------+--------------+ FV DistalFull                                                        +---------+---------------+---------+-----------+----------+--------------+ PFV      Full                                                        +---------+---------------+---------+-----------+----------+--------------+ POP      Full           Yes      Yes                                 +---------+---------------+---------+-----------+----------+--------------+ PTV      Full                                                         +---------+---------------+---------+-----------+----------+--------------+ PERO     Full                                                        +---------+---------------+---------+-----------+----------+--------------+   +---------+---------------+---------+-----------+----------+--------------+ LEFT     CompressibilityPhasicitySpontaneityPropertiesThrombus Aging +---------+---------------+---------+-----------+----------+--------------+ CFV      Full           Yes      Yes                                 +---------+---------------+---------+-----------+----------+--------------+  SFJ      Full                                                        +---------+---------------+---------+-----------+----------+--------------+ FV Prox  Full                                                        +---------+---------------+---------+-----------+----------+--------------+ FV Mid   Full                                                        +---------+---------------+---------+-----------+----------+--------------+ FV DistalFull                                                        +---------+---------------+---------+-----------+----------+--------------+ PFV      Full                                                        +---------+---------------+---------+-----------+----------+--------------+ POP      Full           Yes      Yes                                 +---------+---------------+---------+-----------+----------+--------------+ PTV      Full                                                        +---------+---------------+---------+-----------+----------+--------------+ PERO     Full                                                        +---------+---------------+---------+-----------+----------+--------------+     Summary: BILATERAL: - No evidence of deep vein thrombosis seen in the lower extremities, bilaterally. -No evidence of  popliteal cyst, bilaterally.   *See table(s) above for measurements and observations. Electronically signed by Lonni Gaskins MD on 03/18/2024 at 1:16:22 PM.    Final    DG CHEST PORT 1 VIEW Result Date: 03/17/2024 CLINICAL DATA:  Fever. EXAM: PORTABLE CHEST 1 VIEW COMPARISON:  03/13/2024 FINDINGS: Lungs are adequately inflated and otherwise clear. Cardiomediastinal silhouette and remainder of the exam is unchanged. IMPRESSION: No active disease. Electronically Signed   By: Toribio Agreste M.D.   On: 03/17/2024 10:46   US  Abdomen Limited  RUQ (LIVER/GB) Result Date: 03/14/2024 EXAM: Right Upper Quadrant Abdominal Ultrasound 03/14/2024 04:56:00 AM TECHNIQUE: Real-time ultrasonography of the right upper quadrant of the abdomen was performed. COMPARISON: CT of the abdomen and pelvis dated 03/13/2024. CLINICAL HISTORY: 6194 Acute cholecystitis 6194. Acute cholecystitis 6194. FINDINGS: LIVER: The liver is diffusely echogenic suggesting underlying steatosis. No intrahepatic biliary ductal dilatation. No evidence of mass. There is normal hepatopetal blood flow within the portal vein. BILIARY SYSTEM: No pericholecystic fluid. The gallbladder wall measures 4 mm in thickness. No cholelithiasis. The patient is not focally tender over the gallbladder. Common bile duct is within normal limits measuring 5 mm. RIGHT KIDNEY: The right kidney is grossly unremarkable in appearances without evidence of hydronephrosis, echogenic calculi or worrisome mass lesions. OTHER: No right upper quadrant ascites. IMPRESSION: 1. No sonographic evidence of acute cholecystitis. 2. Hepatic steatosis. Electronically signed by: Evalene Coho MD 03/14/2024 05:05 AM EDT RP Workstation: [id number]   CT ABDOMEN PELVIS W CONTRAST Result Date: 03/13/2024 EXAM: CT ABDOMEN AND PELVIS WITH CONTRAST 03/13/2024 07:19:36 PM TECHNIQUE: CT of the abdomen and pelvis was performed with the administration of 75 mL of iohexol (OMNIPAQUE) 350 MG/ML  injection. Multiplanar reformatted images are provided for review. Automated exposure control, iterative reconstruction, and/or weight-based adjustment of the mA/kV was utilized to reduce the radiation dose to as low as reasonably achievable. COMPARISON: CT abdomen and pelvis 03/14/2018. CLINICAL HISTORY: Diarrhea. Hypotension; Diarrhea x several days; Hx of hep C; 75 ml omni 350 FINDINGS: LOWER CHEST: No acute abnormality. LIVER: The liver is unremarkable. GALLBLADDER AND BILE DUCTS: There is trace inflammatory stranding surrounding the gallbladder. The gallbladder is distended. No biliary ductal dilatation. SPLEEN: No acute abnormality. PANCREAS: No acute abnormality. ADRENAL GLANDS: No acute abnormality. KIDNEYS, URETERS AND BLADDER: There are punctate nonobstructing bilateral renal calculi, left greater than right. No hydronephrosis. No perinephric or periureteral stranding. Urinary bladder is unremarkable. GI AND BOWEL: Stomach demonstrates no acute abnormality. There is some mild diffuse colonic wall thickening. There is descending and sigmoid colon diverticulosis. The appendix is within normal limits. There is no bowel obstruction. PERITONEUM AND RETROPERITONEUM: No ascites. No free air. VASCULATURE: Aorta is normal in caliber. There are atherosclerotic calcifications of the aorta. LYMPH NODES: No lymphadenopathy. REPRODUCTIVE ORGANS: Prostate gland is mildly enlarged. BONES AND SOFT TISSUES: There is chronic compression deformity of the superior endplate of T11 and T12 which appears new from 2019. There are small fat-containing bilateral inguinal hernias. No focal soft tissue abnormality. IMPRESSION: 1. Mild diffuse colonic wall thickening worrisome for nonspecific colitis . 2. Findings suspicious for acute cholecystitis. 3. Descending and sigmoid colon diverticulosis without evidence of diverticulitis. 4. Punctate nonobstructing bilateral renal calculi, left greater than right. 5. Chronic compression  deformities of the superior endplates of T11 and T12, new from 2019. correlate clinically. Electronically signed by: Greig Pique MD 03/13/2024 07:37 PM EDT RP Workstation: [id number]   DG Chest Portable 1 View Result Date: 03/13/2024 EXAM: 1 VIEW(S) XRAY OF THE CHEST 03/13/2024 04:50:00 PM COMPARISON: 12 slash 21 slash 23 CLINICAL HISTORY: near syncope FINDINGS: LUNGS AND PLEURA: No focal pulmonary opacity. No pulmonary edema. No pleural effusion. No pneumothorax. HEART AND MEDIASTINUM: No acute abnormality of the cardiac and mediastinal silhouettes. BONES AND SOFT TISSUES: Partially visible lower cervical spine ACDF hardware. No acute osseous abnormality. IMPRESSION: 1. No acute cardiopulmonary process . Electronically signed by: Norman Gatlin MD 03/13/2024 05:15 PM EDT RP Workstation: [id number]    There are no new results to review at this time.  Previous  records (including but not limited to H&P, progress notes, nursing notes, TOC management) were reviewed in assessment of this patient.  Labs: CBC: Recent Labs  Lab 04/07/24 1951 04/08/24 0500 04/09/24 0933 04/10/24 0226 04/11/24 0455 04/12/24 0455  WBC 9.4 8.9 8.1 8.3 9.3 7.8  NEUTROABS 6.8  --   --   --   --   --   HGB 12.3* 11.4* 11.7* 11.0* 11.4* 10.9*  HCT 39.0 36.6* 36.0* 33.4* 34.7* 33.3*  MCV 101.6* 102.2* 98.1 97.7 98.0 96.5  PLT 305 291 288 271 285 288   Basic Metabolic Panel: Recent Labs  Lab 04/08/24 0500 04/09/24 0303 04/09/24 0933 04/10/24 0226 04/11/24 0455 04/12/24 0455  NA 138  --  136 137 136 136  K 4.6  --  4.0 4.3 4.4 4.3  CL 101  --  100 102 101 100  CO2 26  --  24 24 23 24   GLUCOSE 116*  --  142* 152* 119* 107*  BUN 12  --  11 13 15 15   CREATININE 0.92  --  0.96 1.06 0.99 0.99  CALCIUM 9.6  --  9.2 8.6* 8.9 8.7*  MG 1.6* 1.5*  --  2.0 2.0  --   PHOS 3.5  --   --   --  3.9  --    Liver Function Tests: Recent Labs  Lab 04/07/24 1951 04/09/24 0933 04/10/24 0226  AST 33 29 28  ALT 17 17 17    ALKPHOS 103 84 75  BILITOT 1.1 0.8 0.9  PROT 8.6* 7.9 7.2  ALBUMIN 3.4* 2.4* 2.1*   CBG: Recent Labs  Lab 04/11/24 0727 04/11/24 1147 04/11/24 1648 04/11/24 2111 04/12/24 0747  GLUCAP 112* 148* 162* 119* 106*    Scheduled Meds:  atorvastatin  80 mg Oral Daily   enoxaparin (LOVENOX) injection  40 mg Subcutaneous Q24H   feeding supplement  237 mL Oral BID BM   folic acid  1 mg Oral Daily   insulin  aspart  0-5 Units Subcutaneous QHS   insulin  aspart  0-9 Units Subcutaneous TID WC   ketorolac   15 mg Intravenous Once   metFORMIN  500 mg Oral Q breakfast   multivitamin with minerals  1 tablet Oral Daily   pregabalin  50 mg Oral BID   sulfamethoxazole-trimethoprim  1 tablet Oral Q12H   thiamine  100 mg Oral Daily   Continuous Infusions: PRN Meds:.acetaminophen  **OR** acetaminophen , bisacodyl, LORazepam, ondansetron  **OR** ondansetron  (ZOFRAN ) IV, senna-docusate, traMADol  Family Communication: None at bedside  Disposition: Status is: Inpatient Remains inpatient appropriate because: Encephalopathy     Time spent: 35 minutes  Length of inpatient stay: 5 days  Author: Leatrice LILLETTE Chapel, MD 04/12/2024 2:57 PM  For on call review [email or web address].

## 2024-04-12 NOTE — Plan of Care (Signed)
  Problem: Clinical Measurements: Goal: Ability to maintain clinical measurements within normal limits will improve Outcome: Progressing   Problem: Nutrition: Goal: Adequate nutrition will be maintained Outcome: Progressing   Problem: Coping: Goal: Level of anxiety will decrease Outcome: Progressing   Problem: Elimination: Goal: Will not experience complications related to bowel motility Outcome: Progressing   Problem: Pain Managment: Goal: General experience of comfort will improve and/or be controlled Outcome: Progressing   Problem: Safety: Goal: Ability to remain free from injury will improve Outcome: Progressing   Problem: Skin Integrity: Goal: Risk for impaired skin integrity will decrease Outcome: Progressing

## 2024-04-12 NOTE — Progress Notes (Signed)
 Occupational Therapy Treatment Patient Details Name: Kenneth Joseph MRN: [id number] DOB: 04-16-1956 Today's Date: 04/12/2024   History of present illness Pt is a 68 y.o. male presenting 11/9 to [facility] with AMS from [facility] [place] rehab. MRI brain negative. Started on high dose thiamine. Recent admission in 10/15-23/2025 for colitis and acute neuropathic LE pain. PMH: DM , hiatal hernia, HTN, vitamin B 12 deficiency, alcohol use, peripheral neuropathy, gout   OT comments  Patient received in supine and agreeable to OT treatment.  Patient RUE hand appeared edematous and patient stating painful to touch.  Patient was assisted to EOB with use of bed pad to avoid RUE use with mod assist. Patient able to perform 2 stands from EOB into [place] with use of bed pad with hips and patient using LUE to pull up. After 2nd stand patient asking to return to supine. Patient stating decreased grip strength at LUE and was provided squeeze ball to increase grip strength.  Patient will benefit from continued inpatient follow up therapy, <3 hours/day.  Acute OT to continue to follow to address established goals to facilitate DC to next venue of care.         If plan is discharge home, recommend the following:  Assistance with cooking/housework;Assist for transportation;Direct supervision/assist for medications management;Direct supervision/assist for financial management;Help with stairs or ramp for entrance;A lot of help with walking and/or transfers;A lot of help with bathing/dressing/bathroom   Equipment Recommendations  BSC/3in1;Wheelchair (measurements OT);Wheelchair cushion (measurements OT)    Recommendations for Other Services      Precautions / Restrictions Precautions Precautions: Fall Recall of Precautions/Restrictions: Intact Restrictions Weight Bearing Restrictions Per Provider Order: No       Mobility Bed Mobility Overal bed mobility: Needs Assistance Bed Mobility: Supine to Sit, Sit to  Supine     Supine to sit: Mod assist Sit to supine: Mod assist   General bed mobility comments: use of bed pad to prevent using RUE    Transfers Overall transfer level: Needs assistance Equipment used: Ambulation equipment used Transfers: Sit to/from Stand Sit to Stand: Max assist           General transfer comment: 2 sit to stands performed from EOB into stedy with use of LUE to pull self up and therapist assisting hips with bed pad Transfer via Lift Equipment: Stedy   Balance Overall balance assessment: Needs assistance Sitting-balance support: Single extremity supported, No upper extremity supported, Feet supported Sitting balance-Leahy Scale: Poor Sitting balance - Comments: min assist due to left lateral leaning Postural control: Left lateral lean Standing balance support: Single extremity supported, During functional activity Standing balance-Leahy Scale: Poor Standing balance comment: reliant on Stedy and therapist for support                           ADL either performed or assessed with clinical judgement   ADL Overall ADL's : Needs assistance/impaired Eating/Feeding: Set up;Sitting Eating/Feeding Details (indicate cue type and reason): using left hand Grooming: Set up;Sitting Grooming Details (indicate cue type and reason): on EOB with left hand                               General ADL Comments: limited by right hand and BLE ankle pain    Extremity/Trunk Assessment              Vision       Perception  Praxis     Communication Communication Communication: No apparent difficulties   Cognition Arousal: Alert Behavior During Therapy: WFL for tasks assessed/performed Cognition: Cognition impaired, No family/caregiver present to determine baseline     Awareness: Online awareness impaired Memory impairment (select all impairments): Short-term memory Attention impairment (select first level of impairment): Selective  attention Executive functioning impairment (select all impairments): Reasoning, Problem solving OT - Cognition Comments: pleasant throughout, limited by pain                 Following commands: Intact        Cueing   Cueing Techniques: Verbal cues, Tactile cues, Other (comments)  Exercises      Shoulder Instructions       General Comments RUE hand edematous and painful to touch    Pertinent Vitals/ Pain       Pain Assessment Pain Assessment: Faces Faces Pain Scale: Hurts worst Pain Location: right hand > B ankles Pain Descriptors / Indicators: Sore, Tightness, Tender Pain Intervention(s): Limited activity within patient's tolerance, Monitored during session, Repositioned  Home Living                                          Prior Functioning/Environment              Frequency  Min 2X/week        Progress Toward Goals  OT Goals(current goals can now be found in the care plan section)  Progress towards OT goals: Progressing toward goals  Acute Rehab OT Goals Patient Stated Goal: less pain OT Goal Formulation: With patient Time For Goal Achievement: 04/23/24 Potential to Achieve Goals: Good ADL Goals Pt Will Perform Grooming: with contact guard assist;standing Pt Will Perform Lower Body Bathing: with min assist;sitting/lateral leans;sit to/from stand (with adaptive equipment as needed) Pt Will Perform Upper Body Dressing: with modified independence;sitting (including clothing fasteners; with adaptive equipment as needed) Pt Will Perform Lower Body Dressing: with min assist;sit to/from stand Pt Will Transfer to Toilet: with min assist;stand pivot transfer Pt Will Perform Toileting - Clothing Manipulation and hygiene: with min assist;sit to/from stand Additional ADL Goal #1: pt will follow 3 step commands to optimize independence in ADL and IADL  Plan      Co-evaluation                 AM-PAC OT 6 Clicks Daily Activity      Outcome Measure   Help from another person eating meals?: A Little Help from another person taking care of personal grooming?: A Little Help from another person toileting, which includes using toliet, bedpan, or urinal?: A Lot Help from another person bathing (including washing, rinsing, drying)?: A Lot Help from another person to put on and taking off regular upper body clothing?: A Lot Help from another person to put on and taking off regular lower body clothing?: A Lot 6 Click Score: 14    End of Session Equipment Utilized During Treatment: Gait belt;Other (comment) Laurent)  OT Visit Diagnosis: Unsteadiness on feet (R26.81);Other abnormalities of gait and mobility (R26.89);Muscle weakness (generalized) (M62.81);History of falling (Z91.81);Ataxia, unspecified (R27.0);Other symptoms and signs involving cognitive function;Pain Pain - Right/Left: Right Pain - part of body: Hand   Activity Tolerance Patient limited by pain   Patient Left in bed;with call bell/phone within reach;with bed alarm set   Nurse Communication Mobility status  Time: 9166-9142 OT Time Calculation (min): 24 min  Charges: OT General Charges $OT Visit: 1 Visit OT Treatments $Self Care/Home Management : 8-22 mins $Therapeutic Activity: 8-22 mins  Dick Laine, OTA Acute Rehabilitation Services  Office [phone]   Jeb LITTIE Laine 04/12/2024, 1:28 PM

## 2024-04-12 NOTE — TOC Progression Note (Signed)
 Transition of Care [facility]) - Progression Note    Patient Details  Name: Kenneth Joseph MRN: [id number] Date of Birth: [birth year]  Transition of Care [facility] CM/SW Contact  Sherline Clack, [place] Phone Number: 04/12/2024, 4:28 PM  Clinical Narrative:     CSW spoke with Glenys at [facility] regarding a bed for patient. Tanya requested to lay eyes on the patient and will wait for [facility] facility nurse to conduct a clinical review on patient. Glenys will call CSW back with facility's response. CSW will continue to follow.    Expected Discharge: Skilled Nursing Facility                 Expected Discharge Plan and Services                                               Social Drivers of Health (SDOH) Interventions SDOH Screenings   Food Insecurity: No Food Insecurity (04/08/2024)  Housing: Low Risk  (04/08/2024)  Transportation Needs: No Transportation Needs (04/08/2024)  Utilities: Not At Risk (04/08/2024)  Depression (PHQ2-9): Low Risk  (02/19/2024)  Social Connections: Socially Isolated (04/08/2024)  Tobacco Use: Low Risk  (03/14/2024)    Readmission Risk Interventions     No data to display

## 2024-04-12 NOTE — Plan of Care (Signed)

## 2024-04-12 NOTE — Progress Notes (Signed)
 Notified Dr. Rosario of [facility] called and wanted an update from MD phone number [phone] and free to speak 12-1 pm and after 1630. No orders received. Kenneth Joseph

## 2024-04-13 DIAGNOSIS — G9340 Encephalopathy, unspecified: Secondary | ICD-10-CM | POA: Diagnosis not present

## 2024-04-13 LAB — GLUCOSE, CAPILLARY
Glucose-Capillary: 109 mg/dL — ABNORMAL HIGH (ref 70–99)
Glucose-Capillary: 114 mg/dL — ABNORMAL HIGH (ref 70–99)
Glucose-Capillary: 128 mg/dL — ABNORMAL HIGH (ref 70–99)
Glucose-Capillary: 110 mg/dL — ABNORMAL HIGH (ref 70–99)

## 2024-04-13 NOTE — Progress Notes (Signed)
 Progress Note   Patient: Kenneth Joseph FMW:[id number] DOB: 1955/12/18 DOA: 04/07/2024  DOS: the patient was seen and examined on 04/13/2024   Brief hospital course: Patient is a 68 year old male past medical history for alcohol abuse in remission, DM-2, HTN, B12 deficiency, gout and peripheral polyneuropathy.  Patient was brought to ED from SNF with altered mental status, and admitted for acute encephalopathy.   04/13/2024: No new changes.  Patient is awaiting SNF.  Right upper extremity/wrist area remains relatively swollen.  Check uric acid level.  Patient has history of gout.  Assessment and Plan:  Acute metabolic encephalopathy: - Likely multifactorial etiology including polypharmacy, Warnicke encephalopathy, volume depletion, delirium.  Appears to be resolving.  CT/MRI brain negative.  Other lab workup unremarkable.  EtOH undetected.  Neurology signing off.  Showing improvement with high-dose IV thiamine.  Continuing p.o. thiamine. 04/13/2024: Pursue SNF.  Encephalopathy has resolved significantly.  History of alcohol use disorder: - Reported last EtOH use was 3 weeks prior to hospitalization.  No noted withdrawal.  Diabetes mellitus: - Continue sliding scale insulin  coverage.  - Continue metformin.  Hypomagnesemia: - Last magnesium was 2.  Right arm/wrist pain: - Noted tender 11/12, near site of IV infiltration.  Mildly edematous with slight erythema.  Able to move distal digits, no tenderness in elbow or wrist.  Minimal tenderness in dorsal aspect of right forearm.  Will order ultrasound/x-ray of the wrist.  No known injury.  Will place on empiric Bactrim p.o. as well as give one-time Toradol  IV. 04/13/2024: Patient has history of gout.  Check uric acid level.  Further management will depend on above.  Folate deficiency: - Continue supplements.    Peripheral polyneuropathy: - Continue Lyrica.    Physical debilitation muscle weakness: - Awaiting SNF.  Goals of care -  Recommending STR.  Patient [facility].  Awaiting auth.  Working closely with TOC.   Subjective: No new complaints.  Physical Exam:  Vitals:   04/12/24 2014 04/12/24 2333 04/13/24 0413 04/13/24 0738  BP: 116/75 124/81 113/78 113/72  Pulse: 87 85 76 79  Resp: 18 18 18 18   Temp: 98.4 F (36.9 C) 98.2 F (36.8 C) 97.9 F (36.6 C) 98 F (36.7 C)  TempSrc: Oral Oral Oral   SpO2: 91% 93% 97% 95%    GENERAL: Not in any distress.  Awake and alert  HEENT: Patient is pale.   CARDIOVASCULAR: S1-S2. RESPIRATORY:  Clear to auscultation. GASTROINTESTINAL: Soft and nontender. EXTREMITIES: Wrist area/right upper extremity remains swollen. NEURO: Awake and alert.  Data Reviewed:  Imaging Studies: DG Wrist 2 Views Right Result Date: 04/11/2024 CLINICAL DATA:  Right wrist pain. EXAM: RIGHT WRIST - 2 VIEW COMPARISON:  None Available. FINDINGS: No acute fracture or dislocation. The bones are mildly osteopenic. No significant arthritic changes. There is mild subcutaneous edema. No radiopaque foreign object or soft tissue gas. IMPRESSION: 1. No acute fracture or dislocation. 2. Mild subcutaneous edema. Electronically Signed   By: Vanetta Chou M.D.   On: 04/11/2024 18:36   VAS US  UPPER EXTREMITY VENOUS DUPLEX Result Date: 04/11/2024 UPPER VENOUS STUDY  Patient Name:  Kenneth Joseph  Date of Exam:   04/11/2024 Medical Rec #: [id number]        Accession #:    [id number] Date of Birth: January 02, 1956        Patient Gender: M Patient Age:   89 years Exam Location:  [facility] Procedure:      VAS US  UPPER EXTREMITY VENOUS DUPLEX Referring Phys: EVALENE  OPYD --------------------------------------------------------------------------------  Risk Factors: None identified. Limitations: Poor ultrasound/tissue interface and patient positioning, patient pain tolerance. Comparison Study: No prior studies. Performing Technologist: Cordella Collet RVT  Examination Guidelines: A complete evaluation includes  B-mode imaging, spectral Doppler, color Doppler, and power Doppler as needed of all accessible portions of each vessel. Bilateral testing is considered an integral part of a complete examination. Limited examinations for reoccurring indications may be performed as noted.  Right Findings: +----------+------------+---------+-----------+----------+-------+ RIGHT     CompressiblePhasicitySpontaneousPropertiesSummary +----------+------------+---------+-----------+----------+-------+ IJV           Full       Yes       Yes                      +----------+------------+---------+-----------+----------+-------+ Subclavian               Yes       Yes                      +----------+------------+---------+-----------+----------+-------+ Axillary      Full       Yes       Yes                      +----------+------------+---------+-----------+----------+-------+ Brachial      Full                                          +----------+------------+---------+-----------+----------+-------+ Radial        Full                                          +----------+------------+---------+-----------+----------+-------+ Ulnar         Full                                          +----------+------------+---------+-----------+----------+-------+ Cephalic      Full                                          +----------+------------+---------+-----------+----------+-------+ Basilic       Full                                          +----------+------------+---------+-----------+----------+-------+  Left Findings: +----------+------------+---------+-----------+----------+-------+ LEFT      CompressiblePhasicitySpontaneousPropertiesSummary +----------+------------+---------+-----------+----------+-------+ Subclavian               Yes       Yes                      +----------+------------+---------+-----------+----------+-------+  Summary:  Right: No evidence of  deep vein thrombosis in the upper extremity. No evidence of superficial vein thrombosis in the upper extremity.  Left: No evidence of thrombosis in the subclavian.  *See table(s) above for measurements and observations.  Diagnosing physician: Debby Robertson Electronically signed by Debby Robertson on 04/11/2024 at 12:08:23 PM.    Final    MR Brain W and Wo Contrast Result  Date: 04/08/2024 EXAM: MRI BRAIN WITH AND WITHOUT CONTRAST 04/08/2024 09:02:58 AM TECHNIQUE: Multiplanar multisequence MRI of the head/brain was performed with and without the administration of 7.5 mL of gadobutrol (GADAVIST) 1 MMOL/ML intravenous contrast. COMPARISON: Head CT 04/07/2024. CLINICAL HISTORY: 68 year old male. Weakness and confusion. FINDINGS: BRAIN AND VENTRICLES: No acute infarct. No cortical encephalomalacia or chronic cerebral blood products. Minimal to mild for age nonspecific cerebral white matter signal changes. No acute intracranial hemorrhage. No mass effect or midline shift. No hydrocephalus. The sella is unremarkable. Normal flow voids. Following contrast the major dural venous sinuses are enhancing and appear to be patent. No abnormal intracranial enhancement. No dural thickening. ORBITS: No acute abnormality. SINUSES: Mild paranasal sinus mucosal thickening and left maxillary retention cyst. Mastoids are clear. Grossly normal visible internal auditory structures. BONES AND SOFT TISSUES: Negative for age of visible cervical spine. Normal bone marrow signal and enhancement. No acute soft tissue abnormality. LIMITATIONS/ARTIFACTS: Significant bowel gas outflow. IMPRESSION: 1. No acute intracranial abnormality. Negative for age MRI appearance of the brain. Electronically signed by: Helayne Hurst MD 04/08/2024 09:54 AM EST RP Workstation: [id number]   CT HEAD WO CONTRAST ( ) Result Date: 04/07/2024 EXAM: CT HEAD WITHOUT CONTRAST 04/07/2024 08:10:24 PM TECHNIQUE: CT of the head was performed without the administration of  intravenous contrast. Automated exposure control, iterative reconstruction, and/or weight based adjustment of the mA/kV was utilized to reduce the radiation dose to as low as reasonably achievable. COMPARISON: CT head 11/22/2023. CLINICAL HISTORY: Mental status change, unknown cause. FINDINGS: BRAIN AND VENTRICLES: No acute hemorrhage. No evidence of acute infarct. No hydrocephalus. No extra-axial collection. No mass effect or midline shift. Atherosclerotic calcifications are present within the cavernous internal carotid arteries. Patchy and confluent areas of decreased attenuation are noted throughout the deep and periventricular white matter of the cerebral hemispheres bilaterally, suggestive of chronic microvascular ischemic changes. ORBITS: Bilateral lens replacement. SINUSES: No acute abnormality. SOFT TISSUES AND SKULL: No acute soft tissue abnormality. No skull fracture. IMPRESSION: 1. No acute intracranial abnormality. Electronically signed by: Morgane Naveau MD 04/07/2024 08:15 PM EST RP Workstation: [id number]   DG Chest Port 1 View Result Date: 04/07/2024 EXAM: 1 VIEW(S) XRAY OF THE CHEST 04/07/2024 07:18:00 PM COMPARISON: Chest x-ray 03/18/2023. CLINICAL HISTORY: AMS. FINDINGS: LUNGS AND PLEURA: No focal pulmonary opacity. No pulmonary edema. No pleural effusion. No pneumothorax. HEART AND MEDIASTINUM: No acute abnormality of the cardiac and mediastinal silhouettes. BONES AND SOFT TISSUES: No acute osseous abnormality. IMPRESSION: 1. No acute proces Electronically signed by: Greig Pique MD 04/07/2024 07:25 PM EST RP Workstation: [id number]   VAS US  LOWER EXTREMITY VENOUS (DVT) Result Date: 03/18/2024  Lower Venous DVT Study Patient Name:  MIRACLE CRIADO  Date of Exam:   03/17/2024 Medical Rec #: [id number]        Accession #:    [id number] Date of Birth: Apr 29, 1956        Patient Gender: M Patient Age:   60 years Exam Location:  [facility] Procedure:      VAS US  LOWER EXTREMITY VENOUS  (DVT) Referring Phys: JESSICA VANN --------------------------------------------------------------------------------  Indications: Worsening lower extremity pain. 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  Comparison Study: No prior study on file Performing Technologist: Alberta Lis RVS  Examination Guidelines: A complete evaluation includes B-mode imaging, spectral Doppler, color Doppler, and power Doppler as needed of all accessible portions of each vessel. Bilateral testing is considered an integral part of a complete examination. Limited examinations for reoccurring  indications may be performed as noted. The reflux portion of the exam is performed with the patient in reverse Trendelenburg.  +---------+---------------+---------+-----------+----------+--------------+ RIGHT    CompressibilityPhasicitySpontaneityPropertiesThrombus Aging +---------+---------------+---------+-----------+----------+--------------+ CFV      Full           Yes      Yes                                 +---------+---------------+---------+-----------+----------+--------------+ SFJ      Full                                                        +---------+---------------+---------+-----------+----------+--------------+ FV Prox  Full           Yes      Yes                                 +---------+---------------+---------+-----------+----------+--------------+ FV Mid   Full                                                        +---------+---------------+---------+-----------+----------+--------------+ FV DistalFull                                                        +---------+---------------+---------+-----------+----------+--------------+ PFV      Full                                                        +---------+---------------+---------+-----------+----------+--------------+ POP      Full           Yes      Yes                                  +---------+---------------+---------+-----------+----------+--------------+ PTV      Full                                                        +---------+---------------+---------+-----------+----------+--------------+ PERO     Full                                                        +---------+---------------+---------+-----------+----------+--------------+   +---------+---------------+---------+-----------+----------+--------------+ LEFT     CompressibilityPhasicitySpontaneityPropertiesThrombus Aging +---------+---------------+---------+-----------+----------+--------------+ CFV      Full           Yes  Yes                                 +---------+---------------+---------+-----------+----------+--------------+ SFJ      Full                                                        +---------+---------------+---------+-----------+----------+--------------+ FV Prox  Full                                                        +---------+---------------+---------+-----------+----------+--------------+ FV Mid   Full                                                        +---------+---------------+---------+-----------+----------+--------------+ FV DistalFull                                                        +---------+---------------+---------+-----------+----------+--------------+ PFV      Full                                                        +---------+---------------+---------+-----------+----------+--------------+ POP      Full           Yes      Yes                                 +---------+---------------+---------+-----------+----------+--------------+ PTV      Full                                                        +---------+---------------+---------+-----------+----------+--------------+ PERO     Full                                                         +---------+---------------+---------+-----------+----------+--------------+     Summary: BILATERAL: - No evidence of deep vein thrombosis seen in the lower extremities, bilaterally. -No evidence of popliteal cyst, bilaterally.   *See table(s) above for measurements and observations. Electronically signed by Lonni Gaskins MD on 03/18/2024 at 1:16:22 PM.    Final    DG CHEST PORT 1 VIEW Result Date: 03/17/2024 CLINICAL DATA:  Fever. EXAM: PORTABLE CHEST 1 VIEW COMPARISON:  03/13/2024 FINDINGS: Lungs are adequately inflated and  otherwise clear. Cardiomediastinal silhouette and remainder of the exam is unchanged. IMPRESSION: No active disease. Electronically Signed   By: Toribio Agreste M.D.   On: 03/17/2024 10:46    There are no new results to review at this time.  Previous records (including but not limited to H&P, progress notes, nursing notes, TOC management) were reviewed in assessment of this patient.  Labs: CBC: Recent Labs  Lab 04/07/24 1951 04/08/24 0500 04/09/24 0933 04/10/24 0226 04/11/24 0455 04/12/24 0455  WBC 9.4 8.9 8.1 8.3 9.3 7.8  NEUTROABS 6.8  --   --   --   --   --   HGB 12.3* 11.4* 11.7* 11.0* 11.4* 10.9*  HCT 39.0 36.6* 36.0* 33.4* 34.7* 33.3*  MCV 101.6* 102.2* 98.1 97.7 98.0 96.5  PLT 305 291 288 271 285 288   Basic Metabolic Panel: Recent Labs  Lab 04/08/24 0500 04/09/24 0303 04/09/24 0933 04/10/24 0226 04/11/24 0455 04/12/24 0455  NA 138  --  136 137 136 136  K 4.6  --  4.0 4.3 4.4 4.3  CL 101  --  100 102 101 100  CO2 26  --  24 24 23 24   GLUCOSE 116*  --  142* 152* 119* 107*  BUN 12  --  11 13 15 15   CREATININE 0.92  --  0.96 1.06 0.99 0.99  CALCIUM 9.6  --  9.2 8.6* 8.9 8.7*  MG 1.6* 1.5*  --  2.0 2.0  --   PHOS 3.5  --   --   --  3.9  --    Liver Function Tests: Recent Labs  Lab 04/07/24 1951 04/09/24 0933 04/10/24 0226  AST 33 29 28  ALT 17 17 17   ALKPHOS 103 84 75  BILITOT 1.1 0.8 0.9  PROT 8.6* 7.9 7.2  ALBUMIN 3.4* 2.4* 2.1*    CBG: Recent Labs  Lab 04/12/24 0747 04/12/24 1502 04/12/24 1636 04/12/24 2017 04/13/24 0801  GLUCAP 106* 118* 121* 131* 109*    Scheduled Meds:  atorvastatin  80 mg Oral Daily   enoxaparin (LOVENOX) injection  40 mg Subcutaneous Q24H   feeding supplement  237 mL Oral BID BM   folic acid  1 mg Oral Daily   insulin  aspart  0-5 Units Subcutaneous QHS   insulin  aspart  0-9 Units Subcutaneous TID WC   ketorolac   15 mg Intravenous Once   metFORMIN  500 mg Oral Q breakfast   multivitamin with minerals  1 tablet Oral Daily   pregabalin  50 mg Oral BID   sulfamethoxazole-trimethoprim  1 tablet Oral Q12H   thiamine  100 mg Oral Daily   Continuous Infusions: PRN Meds:.acetaminophen  **OR** acetaminophen , bisacodyl, LORazepam, ondansetron  **OR** ondansetron  (ZOFRAN ) IV, senna-docusate, traMADol  Family Communication: None at bedside  Disposition: Status is: Inpatient Remains inpatient appropriate because: Encephalopathy     Time spent: 35 minutes  Length of inpatient stay: 6 days  Author: Leatrice LILLETTE Chapel, MD 04/13/2024 11:16 AM  For on call review [email or web address].

## 2024-04-14 DIAGNOSIS — G9340 Encephalopathy, unspecified: Secondary | ICD-10-CM | POA: Diagnosis not present

## 2024-04-14 LAB — GLUCOSE, CAPILLARY
Glucose-Capillary: 100 mg/dL — ABNORMAL HIGH (ref 70–99)
Glucose-Capillary: 147 mg/dL — ABNORMAL HIGH (ref 70–99)
Glucose-Capillary: 98 mg/dL (ref 70–99)
Glucose-Capillary: 166 mg/dL — ABNORMAL HIGH (ref 70–99)

## 2024-04-14 LAB — URIC ACID: Uric Acid, Serum: 5.8 mg/dL (ref 3.7–8.6)

## 2024-04-14 NOTE — Plan of Care (Signed)
  Problem: Education: Goal: Ability to describe self-care measures that may prevent or decrease complications (Diabetes Survival Skills Education) will improve 04/14/2024 0655 by Sebastian Columbia, RN Outcome: Progressing 04/14/2024 0654 by Sebastian Columbia, RN Outcome: Progressing   Problem: Coping: Goal: Ability to adjust to condition or change in health will improve 04/14/2024 0655 by Sebastian Columbia, RN Outcome: Progressing 04/14/2024 0654 by Sebastian Columbia, RN Outcome: Progressing   Problem: Fluid Volume: Goal: Ability to maintain a balanced intake and output will improve 04/14/2024 0655 by Sebastian Columbia, RN Outcome: Progressing 04/14/2024 0654 by Sebastian Columbia, RN Outcome: Progressing   Problem: Health Behavior/Discharge Planning: Goal: Ability to identify and utilize available resources and services will improve 04/14/2024 0655 by Sebastian Columbia, RN Outcome: Progressing 04/14/2024 0654 by Sebastian Columbia, RN Outcome: Progressing

## 2024-04-14 NOTE — Plan of Care (Signed)

## 2024-04-14 NOTE — Progress Notes (Signed)
 Progress Note   Patient: Kenneth Joseph FMW:[id number] DOB: July 08, 1955 DOA: 04/07/2024  DOS: the patient was seen and examined on 04/14/2024   Brief hospital course: Patient is a 68 year old male past medical history for alcohol abuse in remission, DM-2, HTN, B12 deficiency, gout and peripheral polyneuropathy.  Patient was brought to ED from SNF with altered mental status, and admitted for acute encephalopathy.   04/13/2024: No new changes.  Patient is awaiting SNF.  Right upper extremity/wrist area remains relatively swollen.  Check uric acid level.  Patient has history of gout.  Assessment and Plan:  Acute metabolic encephalopathy: - Likely multifactorial etiology including polypharmacy, Warnicke encephalopathy, volume depletion, delirium.  Appears to be resolving.  CT/MRI brain negative.  Other lab workup unremarkable.  EtOH undetected.  Neurology signing off.  Showing improvement with high-dose IV thiamine.  Continuing p.o. thiamine. 04/14/2024: Pursue SNF.  Encephalopathy has resolved significantly.  History of alcohol use disorder: - Reported last EtOH use was 3 weeks prior to hospitalization.  No noted withdrawal.  Diabetes mellitus: - Continue sliding scale insulin  coverage.  - Continue metformin.  Hypomagnesemia: - Last magnesium was 2.  Right arm/wrist pain: - Noted tender 11/12, near site of IV infiltration.  Mildly edematous with slight erythema.  Able to move distal digits, no tenderness in elbow or wrist.  Minimal tenderness in dorsal aspect of right forearm.  Will order ultrasound/x-ray of the wrist.  No known injury.  Will place on empiric Bactrim p.o. as well as give one-time Toradol  IV. 04/13/2024: Patient has history of gout.  Check uric acid level.  Further management will depend on above.  Folate deficiency: - Continue supplements.    Peripheral polyneuropathy: - Continue Lyrica.    Physical debilitation muscle weakness: - Awaiting SNF.  Goals of care -  Recommending STR.  Patient [facility].  Awaiting auth.  Working closely with TOC.   Subjective: No new complaints.  Physical Exam:  Vitals:   04/14/24 0452 04/14/24 0814 04/14/24 1128 04/14/24 1637  BP: 116/71 120/80 (!) 121/95 127/79  Pulse: 72 71 81 81  Resp: 16 16    Temp: 97.8 F (36.6 C) 98.1 F (36.7 C) (!) 97.5 F (36.4 C) 97.8 F (36.6 C)  TempSrc: Oral Oral    SpO2: 90% (!) 88% 93% 92%    GENERAL: Not in any distress.  Awake and alert  HEENT: Patient is pale.   CARDIOVASCULAR: S1-S2. RESPIRATORY:  Clear to auscultation. GASTROINTESTINAL: Soft and nontender. EXTREMITIES: Wrist area/right upper extremity remains swollen. NEURO: Awake and alert.  Data Reviewed:  Imaging Studies: DG Wrist 2 Views Right Result Date: 04/11/2024 CLINICAL DATA:  Right wrist pain. EXAM: RIGHT WRIST - 2 VIEW COMPARISON:  None Available. FINDINGS: No acute fracture or dislocation. The bones are mildly osteopenic. No significant arthritic changes. There is mild subcutaneous edema. No radiopaque foreign object or soft tissue gas. IMPRESSION: 1. No acute fracture or dislocation. 2. Mild subcutaneous edema. Electronically Signed   By: Vanetta Chou M.D.   On: 04/11/2024 18:36   VAS US  UPPER EXTREMITY VENOUS DUPLEX Result Date: 04/11/2024 UPPER VENOUS STUDY  Patient Name:  Kenneth Joseph  Date of Exam:   04/11/2024 Medical Rec #: [id number]        Accession #:    [id number] Date of Birth: 12-28-1955        Patient Gender: M Patient Age:   68 years Exam Location:  [facility] Procedure:      VAS US  UPPER EXTREMITY VENOUS DUPLEX  Referring Phys: TIMOTHY OPYD --------------------------------------------------------------------------------  Risk Factors: None identified. Limitations: Poor ultrasound/tissue interface and patient positioning, patient pain tolerance. Comparison Study: No prior studies. Performing Technologist: Cordella Collet RVT  Examination Guidelines: A complete evaluation  includes B-mode imaging, spectral Doppler, color Doppler, and power Doppler as needed of all accessible portions of each vessel. Bilateral testing is considered an integral part of a complete examination. Limited examinations for reoccurring indications may be performed as noted.  Right Findings: +----------+------------+---------+-----------+----------+-------+ RIGHT     CompressiblePhasicitySpontaneousPropertiesSummary +----------+------------+---------+-----------+----------+-------+ IJV           Full       Yes       Yes                      +----------+------------+---------+-----------+----------+-------+ Subclavian               Yes       Yes                      +----------+------------+---------+-----------+----------+-------+ Axillary      Full       Yes       Yes                      +----------+------------+---------+-----------+----------+-------+ Brachial      Full                                          +----------+------------+---------+-----------+----------+-------+ Radial        Full                                          +----------+------------+---------+-----------+----------+-------+ Ulnar         Full                                          +----------+------------+---------+-----------+----------+-------+ Cephalic      Full                                          +----------+------------+---------+-----------+----------+-------+ Basilic       Full                                          +----------+------------+---------+-----------+----------+-------+  Left Findings: +----------+------------+---------+-----------+----------+-------+ LEFT      CompressiblePhasicitySpontaneousPropertiesSummary +----------+------------+---------+-----------+----------+-------+ Subclavian               Yes       Yes                      +----------+------------+---------+-----------+----------+-------+  Summary:  Right: No  evidence of deep vein thrombosis in the upper extremity. No evidence of superficial vein thrombosis in the upper extremity.  Left: No evidence of thrombosis in the subclavian.  *See table(s) above for measurements and observations.  Diagnosing physician: Debby Robertson Electronically signed by Debby Robertson on 04/11/2024 at 12:08:23 PM.    Final    MR Brain W and  Wo Contrast Result Date: 04/08/2024 EXAM: MRI BRAIN WITH AND WITHOUT CONTRAST 04/08/2024 09:02:58 AM TECHNIQUE: Multiplanar multisequence MRI of the head/brain was performed with and without the administration of 7.5 mL of gadobutrol (GADAVIST) 1 MMOL/ML intravenous contrast. COMPARISON: Head CT 04/07/2024. CLINICAL HISTORY: 68 year old male. Weakness and confusion. FINDINGS: BRAIN AND VENTRICLES: No acute infarct. No cortical encephalomalacia or chronic cerebral blood products. Minimal to mild for age nonspecific cerebral white matter signal changes. No acute intracranial hemorrhage. No mass effect or midline shift. No hydrocephalus. The sella is unremarkable. Normal flow voids. Following contrast the major dural venous sinuses are enhancing and appear to be patent. No abnormal intracranial enhancement. No dural thickening. ORBITS: No acute abnormality. SINUSES: Mild paranasal sinus mucosal thickening and left maxillary retention cyst. Mastoids are clear. Grossly normal visible internal auditory structures. BONES AND SOFT TISSUES: Negative for age of visible cervical spine. Normal bone marrow signal and enhancement. No acute soft tissue abnormality. LIMITATIONS/ARTIFACTS: Significant bowel gas outflow. IMPRESSION: 1. No acute intracranial abnormality. Negative for age MRI appearance of the brain. Electronically signed by: Helayne Hurst MD 04/08/2024 09:54 AM EST RP Workstation: [id number]   CT HEAD WO CONTRAST ( ) Result Date: 04/07/2024 EXAM: CT HEAD WITHOUT CONTRAST 04/07/2024 08:10:24 PM TECHNIQUE: CT of the head was performed without the  administration of intravenous contrast. Automated exposure control, iterative reconstruction, and/or weight based adjustment of the mA/kV was utilized to reduce the radiation dose to as low as reasonably achievable. COMPARISON: CT head 11/22/2023. CLINICAL HISTORY: Mental status change, unknown cause. FINDINGS: BRAIN AND VENTRICLES: No acute hemorrhage. No evidence of acute infarct. No hydrocephalus. No extra-axial collection. No mass effect or midline shift. Atherosclerotic calcifications are present within the cavernous internal carotid arteries. Patchy and confluent areas of decreased attenuation are noted throughout the deep and periventricular white matter of the cerebral hemispheres bilaterally, suggestive of chronic microvascular ischemic changes. ORBITS: Bilateral lens replacement. SINUSES: No acute abnormality. SOFT TISSUES AND SKULL: No acute soft tissue abnormality. No skull fracture. IMPRESSION: 1. No acute intracranial abnormality. Electronically signed by: Morgane Naveau MD 04/07/2024 08:15 PM EST RP Workstation: [id number]   DG Chest Port 1 View Result Date: 04/07/2024 EXAM: 1 VIEW(S) XRAY OF THE CHEST 04/07/2024 07:18:00 PM COMPARISON: Chest x-ray 03/18/2023. CLINICAL HISTORY: AMS. FINDINGS: LUNGS AND PLEURA: No focal pulmonary opacity. No pulmonary edema. No pleural effusion. No pneumothorax. HEART AND MEDIASTINUM: No acute abnormality of the cardiac and mediastinal silhouettes. BONES AND SOFT TISSUES: No acute osseous abnormality. IMPRESSION: 1. No acute proces Electronically signed by: Greig Pique MD 04/07/2024 07:25 PM EST RP Workstation: [id number]   VAS US  LOWER EXTREMITY VENOUS (DVT) Result Date: 03/18/2024  Lower Venous DVT Study Patient Name:  KIRUBEL AJA  Date of Exam:   03/17/2024 Medical Rec #: [id number]        Accession #:    [id number] Date of Birth: 11/01/1955        Patient Gender: M Patient Age:   68 years Exam Location:  [facility] Procedure:      VAS US  LOWER  EXTREMITY VENOUS (DVT) Referring Phys: JESSICA VANN --------------------------------------------------------------------------------  Indications: Worsening lower extremity pain. 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  Comparison Study: No prior study on file Performing Technologist: Alberta Lis RVS  Examination Guidelines: A complete evaluation includes B-mode imaging, spectral Doppler, color Doppler, and power Doppler as needed of all accessible portions of each vessel. Bilateral testing is considered an integral part of a complete examination. Limited  examinations for reoccurring indications may be performed as noted. The reflux portion of the exam is performed with the patient in reverse Trendelenburg.  +---------+---------------+---------+-----------+----------+--------------+ RIGHT    CompressibilityPhasicitySpontaneityPropertiesThrombus Aging +---------+---------------+---------+-----------+----------+--------------+ CFV      Full           Yes      Yes                                 +---------+---------------+---------+-----------+----------+--------------+ SFJ      Full                                                        +---------+---------------+---------+-----------+----------+--------------+ FV Prox  Full           Yes      Yes                                 +---------+---------------+---------+-----------+----------+--------------+ FV Mid   Full                                                        +---------+---------------+---------+-----------+----------+--------------+ FV DistalFull                                                        +---------+---------------+---------+-----------+----------+--------------+ PFV      Full                                                        +---------+---------------+---------+-----------+----------+--------------+ POP      Full           Yes      Yes                                  +---------+---------------+---------+-----------+----------+--------------+ PTV      Full                                                        +---------+---------------+---------+-----------+----------+--------------+ PERO     Full                                                        +---------+---------------+---------+-----------+----------+--------------+   +---------+---------------+---------+-----------+----------+--------------+ LEFT     CompressibilityPhasicitySpontaneityPropertiesThrombus Aging +---------+---------------+---------+-----------+----------+--------------+ CFV      Full           Yes  Yes                                 +---------+---------------+---------+-----------+----------+--------------+ SFJ      Full                                                        +---------+---------------+---------+-----------+----------+--------------+ FV Prox  Full                                                        +---------+---------------+---------+-----------+----------+--------------+ FV Mid   Full                                                        +---------+---------------+---------+-----------+----------+--------------+ FV DistalFull                                                        +---------+---------------+---------+-----------+----------+--------------+ PFV      Full                                                        +---------+---------------+---------+-----------+----------+--------------+ POP      Full           Yes      Yes                                 +---------+---------------+---------+-----------+----------+--------------+ PTV      Full                                                        +---------+---------------+---------+-----------+----------+--------------+ PERO     Full                                                         +---------+---------------+---------+-----------+----------+--------------+     Summary: BILATERAL: - No evidence of deep vein thrombosis seen in the lower extremities, bilaterally. -No evidence of popliteal cyst, bilaterally.   *See table(s) above for measurements and observations. Electronically signed by Lonni Gaskins MD on 03/18/2024 at 1:16:22 PM.    Final    DG CHEST PORT 1 VIEW Result Date: 03/17/2024 CLINICAL DATA:  Fever. EXAM: PORTABLE CHEST 1 VIEW COMPARISON:  03/13/2024 FINDINGS: Lungs are adequately inflated and  otherwise clear. Cardiomediastinal silhouette and remainder of the exam is unchanged. IMPRESSION: No active disease. Electronically Signed   By: Toribio Agreste M.D.   On: 03/17/2024 10:46    There are no new results to review at this time.  Previous records (including but not limited to H&P, progress notes, nursing notes, TOC management) were reviewed in assessment of this patient.  Labs: CBC: Recent Labs  Lab 04/07/24 1951 04/08/24 0500 04/09/24 0933 04/10/24 0226 04/11/24 0455 04/12/24 0455  WBC 9.4 8.9 8.1 8.3 9.3 7.8  NEUTROABS 6.8  --   --   --   --   --   HGB 12.3* 11.4* 11.7* 11.0* 11.4* 10.9*  HCT 39.0 36.6* 36.0* 33.4* 34.7* 33.3*  MCV 101.6* 102.2* 98.1 97.7 98.0 96.5  PLT 305 291 288 271 285 288   Basic Metabolic Panel: Recent Labs  Lab 04/08/24 0500 04/09/24 0303 04/09/24 0933 04/10/24 0226 04/11/24 0455 04/12/24 0455  NA 138  --  136 137 136 136  K 4.6  --  4.0 4.3 4.4 4.3  CL 101  --  100 102 101 100  CO2 26  --  24 24 23 24   GLUCOSE 116*  --  142* 152* 119* 107*  BUN 12  --  11 13 15 15   CREATININE 0.92  --  0.96 1.06 0.99 0.99  CALCIUM 9.6  --  9.2 8.6* 8.9 8.7*  MG 1.6* 1.5*  --  2.0 2.0  --   PHOS 3.5  --   --   --  3.9  --    Liver Function Tests: Recent Labs  Lab 04/07/24 1951 04/09/24 0933 04/10/24 0226  AST 33 29 28  ALT 17 17 17   ALKPHOS 103 84 75  BILITOT 1.1 0.8 0.9  PROT 8.6* 7.9 7.2  ALBUMIN 3.4* 2.4* 2.1*    CBG: Recent Labs  Lab 04/13/24 1612 04/13/24 2036 04/14/24 0816 04/14/24 1127 04/14/24 1636  GLUCAP 128* 110* 100* 147* 98    Scheduled Meds:  atorvastatin  80 mg Oral Daily   enoxaparin (LOVENOX) injection  40 mg Subcutaneous Q24H   feeding supplement  237 mL Oral BID BM   folic acid  1 mg Oral Daily   insulin  aspart  0-5 Units Subcutaneous QHS   insulin  aspart  0-9 Units Subcutaneous TID WC   ketorolac   15 mg Intravenous Once   metFORMIN  500 mg Oral Q breakfast   multivitamin with minerals  1 tablet Oral Daily   pregabalin  50 mg Oral BID   sulfamethoxazole-trimethoprim  1 tablet Oral Q12H   thiamine  100 mg Oral Daily   Continuous Infusions: PRN Meds:.acetaminophen  **OR** acetaminophen , bisacodyl, LORazepam, ondansetron  **OR** ondansetron  (ZOFRAN ) IV, senna-docusate, traMADol  Family Communication: None at bedside  Disposition: Status is: Inpatient Remains inpatient appropriate because: Encephalopathy     Time spent: 35 minutes  Length of inpatient stay: 7 days  Author: Leatrice LILLETTE Chapel, MD 04/14/2024 6:36 PM  For on call review [email or web address].

## 2024-04-14 NOTE — Plan of Care (Signed)

## 2024-04-14 NOTE — Plan of Care (Signed)
  Problem: Fluid Volume: Goal: Ability to maintain a balanced intake and output will improve Outcome: Progressing   Problem: Health Behavior/Discharge Planning: Goal: Ability to manage health-related needs will improve Outcome: Progressing   Problem: Metabolic: Goal: Ability to maintain appropriate glucose levels will improve Outcome: Progressing   Problem: Education: Goal: Ability to describe self-care measures that may prevent or decrease complications (Diabetes Survival Skills Education) will improve Outcome: Not Progressing Goal: Individualized Educational Video(s) Outcome: Not Progressing   Problem: Coping: Goal: Ability to adjust to condition or change in health will improve Outcome: Not Progressing

## 2024-04-15 DIAGNOSIS — G9340 Encephalopathy, unspecified: Secondary | ICD-10-CM | POA: Diagnosis not present

## 2024-04-15 LAB — GLUCOSE, CAPILLARY
Glucose-Capillary: 131 mg/dL — ABNORMAL HIGH (ref 70–99)
Glucose-Capillary: 106 mg/dL — ABNORMAL HIGH (ref 70–99)
Glucose-Capillary: 104 mg/dL — ABNORMAL HIGH (ref 70–99)

## 2024-04-15 NOTE — Progress Notes (Signed)
 Progress Note   Patient: Kenneth Joseph FMW:[id number] DOB: Jan 26, 1956 DOA: 04/07/2024  DOS: the patient was seen and examined on 04/15/2024   Brief hospital course: Patient is a 68 year old male past medical history for alcohol abuse in remission, DM-2, HTN, B12 deficiency, gout and peripheral polyneuropathy.  Patient was brought to ED from SNF with altered mental status, and admitted for acute encephalopathy.  Encephalopathy has resolved significantly.  04/15/2024: No new changes.  Patient is awaiting SNF.  Right upper extremity/wrist area remains relatively swollen.  Normal uric acid level.    Assessment and Plan:  Acute metabolic encephalopathy: - Likely multifactorial etiology including polypharmacy, Warnicke encephalopathy, volume depletion, delirium.  Appears to be resolving.  CT/MRI brain negative.  Other lab workup unremarkable.  EtOH undetected.  Neurology signing off.  Showing improvement with high-dose IV thiamine.  Continuing p.o. thiamine. 04/15/2024: Pursue SNF.  Encephalopathy has resolved significantly.  History of alcohol use disorder: - Reported last EtOH use was 3 weeks prior to hospitalization.  No noted withdrawal.  Diabetes mellitus: - Continue sliding scale insulin  coverage.  - Continue metformin.  Hypomagnesemia: - Last magnesium was 2.  Right arm/wrist pain: - Noted tender 11/12, near site of IV infiltration.  Mildly edematous with slight erythema.  Able to move distal digits, no tenderness in elbow or wrist.  Minimal tenderness in dorsal aspect of right forearm.  Will order ultrasound/x-ray of the wrist.  No known injury.  Will place on empiric Bactrim p.o. as well as give one-time Toradol  IV. 04/13/2024: Patient has history of gout.  Check uric acid level.  Further management will depend on above.  Folate deficiency: - Continue supplements.    Peripheral polyneuropathy: - Continue Lyrica.    Physical debilitation muscle weakness: - Awaiting  SNF.  Goals of care - Recommending STR.  Patient [facility].  Awaiting auth.  Working closely with TOC.   Subjective: No new complaints.  Physical Exam:  Vitals:   04/14/24 1930 04/15/24 0020 04/15/24 0453 04/15/24 0830  BP: 117/79 112/76 114/77 115/74  Pulse: 88 81  73  Resp: 18 16 18 20   Temp:  98.1 F (36.7 C) 97.9 F (36.6 C) 97.8 F (36.6 C)  TempSrc:  Oral Oral   SpO2: 96% 94% 97% 97%    GENERAL: Not in any distress.  Awake and alert  HEENT: Patient is pale.   CARDIOVASCULAR: S1-S2. RESPIRATORY:  Clear to auscultation. GASTROINTESTINAL: Soft and nontender. EXTREMITIES: Wrist area/right upper extremity remains swollen. NEURO: Awake and alert.  Data Reviewed:  Imaging Studies: DG Wrist 2 Views Right Result Date: 04/11/2024 CLINICAL DATA:  Right wrist pain. EXAM: RIGHT WRIST - 2 VIEW COMPARISON:  None Available. FINDINGS: No acute fracture or dislocation. The bones are mildly osteopenic. No significant arthritic changes. There is mild subcutaneous edema. No radiopaque foreign object or soft tissue gas. IMPRESSION: 1. No acute fracture or dislocation. 2. Mild subcutaneous edema. Electronically Signed   By: Vanetta Chou M.D.   On: 04/11/2024 18:36   VAS US  UPPER EXTREMITY VENOUS DUPLEX Result Date: 04/11/2024 UPPER VENOUS STUDY  Patient Name:  Kenneth Joseph  Date of Exam:   04/11/2024 Medical Rec #: [id number]        Accession #:    [id number] Date of Birth: 02-25-1956        Patient Gender: M Patient Age:   52 years Exam Location:  [facility] Procedure:      VAS US  UPPER EXTREMITY VENOUS DUPLEX Referring Phys: TIMOTHY OPYD --------------------------------------------------------------------------------  Risk Factors: None identified. Limitations: Poor ultrasound/tissue interface and patient positioning, patient pain tolerance. Comparison Study: No prior studies. Performing Technologist: Cordella Collet RVT  Examination Guidelines: A complete evaluation  includes B-mode imaging, spectral Doppler, color Doppler, and power Doppler as needed of all accessible portions of each vessel. Bilateral testing is considered an integral part of a complete examination. Limited examinations for reoccurring indications may be performed as noted.  Right Findings: +----------+------------+---------+-----------+----------+-------+ RIGHT     CompressiblePhasicitySpontaneousPropertiesSummary +----------+------------+---------+-----------+----------+-------+ IJV           Full       Yes       Yes                      +----------+------------+---------+-----------+----------+-------+ Subclavian               Yes       Yes                      +----------+------------+---------+-----------+----------+-------+ Axillary      Full       Yes       Yes                      +----------+------------+---------+-----------+----------+-------+ Brachial      Full                                          +----------+------------+---------+-----------+----------+-------+ Radial        Full                                          +----------+------------+---------+-----------+----------+-------+ Ulnar         Full                                          +----------+------------+---------+-----------+----------+-------+ Cephalic      Full                                          +----------+------------+---------+-----------+----------+-------+ Basilic       Full                                          +----------+------------+---------+-----------+----------+-------+  Left Findings: +----------+------------+---------+-----------+----------+-------+ LEFT      CompressiblePhasicitySpontaneousPropertiesSummary +----------+------------+---------+-----------+----------+-------+ Subclavian               Yes       Yes                      +----------+------------+---------+-----------+----------+-------+  Summary:  Right: No  evidence of deep vein thrombosis in the upper extremity. No evidence of superficial vein thrombosis in the upper extremity.  Left: No evidence of thrombosis in the subclavian.  *See table(s) above for measurements and observations.  Diagnosing physician: Debby Robertson Electronically signed by Debby Robertson on 04/11/2024 at 12:08:23 PM.    Final    MR Brain W and Wo Contrast Result Date: 04/08/2024 EXAM:  MRI BRAIN WITH AND WITHOUT CONTRAST 04/08/2024 09:02:58 AM TECHNIQUE: Multiplanar multisequence MRI of the head/brain was performed with and without the administration of 7.5 mL of gadobutrol (GADAVIST) 1 MMOL/ML intravenous contrast. COMPARISON: Head CT 04/07/2024. CLINICAL HISTORY: 68 year old male. Weakness and confusion. FINDINGS: BRAIN AND VENTRICLES: No acute infarct. No cortical encephalomalacia or chronic cerebral blood products. Minimal to mild for age nonspecific cerebral white matter signal changes. No acute intracranial hemorrhage. No mass effect or midline shift. No hydrocephalus. The sella is unremarkable. Normal flow voids. Following contrast the major dural venous sinuses are enhancing and appear to be patent. No abnormal intracranial enhancement. No dural thickening. ORBITS: No acute abnormality. SINUSES: Mild paranasal sinus mucosal thickening and left maxillary retention cyst. Mastoids are clear. Grossly normal visible internal auditory structures. BONES AND SOFT TISSUES: Negative for age of visible cervical spine. Normal bone marrow signal and enhancement. No acute soft tissue abnormality. LIMITATIONS/ARTIFACTS: Significant bowel gas outflow. IMPRESSION: 1. No acute intracranial abnormality. Negative for age MRI appearance of the brain. Electronically signed by: Helayne Hurst MD 04/08/2024 09:54 AM EST RP Workstation: [id number]   CT HEAD WO CONTRAST ( ) Result Date: 04/07/2024 EXAM: CT HEAD WITHOUT CONTRAST 04/07/2024 08:10:24 PM TECHNIQUE: CT of the head was performed without the  administration of intravenous contrast. Automated exposure control, iterative reconstruction, and/or weight based adjustment of the mA/kV was utilized to reduce the radiation dose to as low as reasonably achievable. COMPARISON: CT head 11/22/2023. CLINICAL HISTORY: Mental status change, unknown cause. FINDINGS: BRAIN AND VENTRICLES: No acute hemorrhage. No evidence of acute infarct. No hydrocephalus. No extra-axial collection. No mass effect or midline shift. Atherosclerotic calcifications are present within the cavernous internal carotid arteries. Patchy and confluent areas of decreased attenuation are noted throughout the deep and periventricular white matter of the cerebral hemispheres bilaterally, suggestive of chronic microvascular ischemic changes. ORBITS: Bilateral lens replacement. SINUSES: No acute abnormality. SOFT TISSUES AND SKULL: No acute soft tissue abnormality. No skull fracture. IMPRESSION: 1. No acute intracranial abnormality. Electronically signed by: Morgane Naveau MD 04/07/2024 08:15 PM EST RP Workstation: [id number]   DG Chest Port 1 View Result Date: 04/07/2024 EXAM: 1 VIEW(S) XRAY OF THE CHEST 04/07/2024 07:18:00 PM COMPARISON: Chest x-ray 03/18/2023. CLINICAL HISTORY: AMS. FINDINGS: LUNGS AND PLEURA: No focal pulmonary opacity. No pulmonary edema. No pleural effusion. No pneumothorax. HEART AND MEDIASTINUM: No acute abnormality of the cardiac and mediastinal silhouettes. BONES AND SOFT TISSUES: No acute osseous abnormality. IMPRESSION: 1. No acute proces Electronically signed by: Greig Pique MD 04/07/2024 07:25 PM EST RP Workstation: [id number]   VAS US  LOWER EXTREMITY VENOUS (DVT) Result Date: 03/18/2024  Lower Venous DVT Study Patient Name:  KODAH MARET  Date of Exam:   03/17/2024 Medical Rec #: [id number]        Accession #:    [id number] Date of Birth: 1956/01/31        Patient Gender: M Patient Age:   89 years Exam Location:  [facility] Procedure:      VAS US  LOWER  EXTREMITY VENOUS (DVT) Referring Phys: JESSICA VANN --------------------------------------------------------------------------------  Indications: Worsening lower extremity pain. 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  Comparison Study: No prior study on file Performing Technologist: Alberta Lis RVS  Examination Guidelines: A complete evaluation includes B-mode imaging, spectral Doppler, color Doppler, and power Doppler as needed of all accessible portions of each vessel. Bilateral testing is considered an integral part of a complete examination. Limited examinations for reoccurring indications may be  performed as noted. The reflux portion of the exam is performed with the patient in reverse Trendelenburg.  +---------+---------------+---------+-----------+----------+--------------+ RIGHT    CompressibilityPhasicitySpontaneityPropertiesThrombus Aging +---------+---------------+---------+-----------+----------+--------------+ CFV      Full           Yes      Yes                                 +---------+---------------+---------+-----------+----------+--------------+ SFJ      Full                                                        +---------+---------------+---------+-----------+----------+--------------+ FV Prox  Full           Yes      Yes                                 +---------+---------------+---------+-----------+----------+--------------+ FV Mid   Full                                                        +---------+---------------+---------+-----------+----------+--------------+ FV DistalFull                                                        +---------+---------------+---------+-----------+----------+--------------+ PFV      Full                                                        +---------+---------------+---------+-----------+----------+--------------+ POP      Full           Yes      Yes                                  +---------+---------------+---------+-----------+----------+--------------+ PTV      Full                                                        +---------+---------------+---------+-----------+----------+--------------+ PERO     Full                                                        +---------+---------------+---------+-----------+----------+--------------+   +---------+---------------+---------+-----------+----------+--------------+ LEFT     CompressibilityPhasicitySpontaneityPropertiesThrombus Aging +---------+---------------+---------+-----------+----------+--------------+ CFV      Full           Yes      Yes                                 +---------+---------------+---------+-----------+----------+--------------+  SFJ      Full                                                        +---------+---------------+---------+-----------+----------+--------------+ FV Prox  Full                                                        +---------+---------------+---------+-----------+----------+--------------+ FV Mid   Full                                                        +---------+---------------+---------+-----------+----------+--------------+ FV DistalFull                                                        +---------+---------------+---------+-----------+----------+--------------+ PFV      Full                                                        +---------+---------------+---------+-----------+----------+--------------+ POP      Full           Yes      Yes                                 +---------+---------------+---------+-----------+----------+--------------+ PTV      Full                                                        +---------+---------------+---------+-----------+----------+--------------+ PERO     Full                                                         +---------+---------------+---------+-----------+----------+--------------+     Summary: BILATERAL: - No evidence of deep vein thrombosis seen in the lower extremities, bilaterally. -No evidence of popliteal cyst, bilaterally.   *See table(s) above for measurements and observations. Electronically signed by Lonni Gaskins MD on 03/18/2024 at 1:16:22 PM.    Final    DG CHEST PORT 1 VIEW Result Date: 03/17/2024 CLINICAL DATA:  Fever. EXAM: PORTABLE CHEST 1 VIEW COMPARISON:  03/13/2024 FINDINGS: Lungs are adequately inflated and otherwise clear. Cardiomediastinal silhouette and remainder of the exam is unchanged. IMPRESSION: No active disease. Electronically Signed   By: Toribio Agreste M.D.   On: 03/17/2024 10:46    There are  no new results to review at this time.  Previous records (including but not limited to H&P, progress notes, nursing notes, TOC management) were reviewed in assessment of this patient.  Labs: CBC: Recent Labs  Lab 04/09/24 0933 04/10/24 0226 04/11/24 0455 04/12/24 0455  WBC 8.1 8.3 9.3 7.8  HGB 11.7* 11.0* 11.4* 10.9*  HCT 36.0* 33.4* 34.7* 33.3*  MCV 98.1 97.7 98.0 96.5  PLT 288 271 285 288   Basic Metabolic Panel: Recent Labs  Lab 04/09/24 0303 04/09/24 0933 04/10/24 0226 04/11/24 0455 04/12/24 0455  NA  --  136 137 136 136  K  --  4.0 4.3 4.4 4.3  CL  --  100 102 101 100  CO2  --  24 24 23 24   GLUCOSE  --  142* 152* 119* 107*  BUN  --  11 13 15 15   CREATININE  --  0.96 1.06 0.99 0.99  CALCIUM  --  9.2 8.6* 8.9 8.7*  MG 1.5*  --  2.0 2.0  --   PHOS  --   --   --  3.9  --    Liver Function Tests: Recent Labs  Lab 04/09/24 0933 04/10/24 0226  AST 29 28  ALT 17 17  ALKPHOS 84 75  BILITOT 0.8 0.9  PROT 7.9 7.2  ALBUMIN 2.4* 2.1*   CBG: Recent Labs  Lab 04/14/24 0816 04/14/24 1127 04/14/24 1636 04/14/24 1942 04/15/24 0925  GLUCAP 100* 147* 98 166* 131*    Scheduled Meds:  atorvastatin  80 mg Oral Daily   enoxaparin (LOVENOX)  injection  40 mg Subcutaneous Q24H   feeding supplement  237 mL Oral BID BM   folic acid  1 mg Oral Daily   insulin  aspart  0-5 Units Subcutaneous QHS   insulin  aspart  0-9 Units Subcutaneous TID WC   ketorolac   15 mg Intravenous Once   metFORMIN  500 mg Oral Q breakfast   multivitamin with minerals  1 tablet Oral Daily   pregabalin  50 mg Oral BID   sulfamethoxazole-trimethoprim  1 tablet Oral Q12H   thiamine  100 mg Oral Daily   Continuous Infusions: PRN Meds:.acetaminophen  **OR** acetaminophen , bisacodyl, LORazepam, ondansetron  **OR** ondansetron  (ZOFRAN ) IV, senna-docusate, traMADol  Family Communication: None at bedside  Disposition: Status is: Inpatient Remains inpatient appropriate because: Encephalopathy     Time spent: 35 minutes  Length of inpatient stay: 8 days  Author: Leatrice LILLETTE Chapel, MD 04/15/2024 12:02 PM  For on call review [email or web address].

## 2024-04-15 NOTE — Progress Notes (Signed)
 Mobility Specialist: Progress Note   04/15/24 1500  Mobility  Activity Pivoted/transferred from chair to bed  Level of Assistance Moderate assist, patient does 50-74%  Assistive Device Stedy  Activity Response Tolerated well  Mobility Referral Yes  Mobility visit 1 Mobility  Mobility Specialist Start Time (ACUTE ONLY) 1350  Mobility Specialist Stop Time (ACUTE ONLY) 1405  Mobility Specialist Time Calculation (min) (ACUTE ONLY) 15 min    Pt received in chair, requesting assistance back to bed. Heavy modA for STS from chair, light modA for STS from stedy. MinA for sit>supine. C/o RUE and LE pain. Left in bed with all needs met, call bell in reach. Bed alarm on.   Kenneth Joseph Mobility Specialist Please contact via SecureChat or Rehab office at [phone]

## 2024-04-15 NOTE — Progress Notes (Signed)
 Physical Therapy Treatment Patient Details Name: Kenneth Joseph MRN: [id number] DOB: 1955-12-18 Today's Date: 04/15/2024   History of Present Illness Pt is a 68 y.o. male presenting 11/9 to [facility] with AMS from [facility] rehab. MRI brain negative. Started on high dose thiamine. Recent admission in 10/15-23/2025 for colitis and acute neuropathic LE pain. PMH: DM , hiatal hernia, HTN, vitamin B 12 deficiency, alcohol use, peripheral neuropathy, gout    PT Comments  Pt admitted with above diagnosis. Pt stood to [employer] x 4 with pt able to incr time in standing 1st and second attempt but last 2 attempts reports fatigue and didn't want to stand again after 4 attempts. Pt moving better overall and tolerated a little more today as far as standing attempts.  Appears to be using right UE a little more today with noted continued edema.  Pt currently with functional limitations due to the deficits listed below (see PT Problem List). Pt will benefit from acute skilled PT to increase their independence and safety with mobility to allow discharge.       If plan is discharge home, recommend the following: A lot of help with bathing/dressing/bathroom;A lot of help with walking and/or transfers;Assistance with cooking/housework;Assist for transportation;Help with stairs or ramp for entrance   Can travel by private vehicle     No  Equipment Recommendations  None recommended by PT    Recommendations for Other Services       Precautions / Restrictions Precautions Precautions: Fall Recall of Precautions/Restrictions: Intact Restrictions Weight Bearing Restrictions Per Provider Order: No     Mobility  Bed Mobility Overal bed mobility: Needs Assistance Bed Mobility: Supine to Sit, Sit to Supine     Supine to sit: Mod assist     General bed mobility comments: help with use of bed pad to prevent using RUE    Transfers Overall transfer level: Needs assistance Equipment used: Ambulation  equipment used Transfers: Sit to/from Stand Sit to Stand: Mod assist, +2 safety/equipment, From elevated surface, Via lift equipment           General transfer comment: 4 sit to stands performed from EOB into stedy with use of bil UE to pull self up and therapist assisting hips with bed pad.  Pt was able to grip with both hands placed on Stedy and pulling more with left UE.  Continued edema right hand noted. Longest pt could stand was up to 40 seconds. Transfer via Lift Equipment: Stedy  Ambulation/Gait                   Stairs             Wheelchair Mobility     Tilt Bed    Modified Rankin (Stroke Patients Only)       Balance Overall balance assessment: Needs assistance Sitting-balance support: No upper extremity supported, Feet supported, Bilateral upper extremity supported, Single extremity supported Sitting balance-Leahy Scale: Poor Sitting balance - Comments: min assist due to left lateral leaning Postural control: Left lateral lean Standing balance support: During functional activity, Bilateral upper extremity supported, Reliant on assistive device for balance Standing balance-Leahy Scale: Poor Standing balance comment: reliant on Stedy and therapist for support                            Communication Communication Communication: No apparent difficulties  Cognition Arousal: Alert Behavior During Therapy: WFL for tasks assessed/performed   PT - Cognitive impairments:  Safety/Judgement, Problem solving, Awareness                       PT - Cognition Comments: pt A&Ox4 Following commands: Intact      Cueing Cueing Techniques: Verbal cues, Tactile cues, Other (comments)  Exercises General Exercises - Lower Extremity Long Arc Quad: AROM, Seated, Both, 10 reps Hip Flexion/Marching: AROM, Both, 10 reps, Seated    General Comments General comments (skin integrity, edema, etc.): right hand edema      Pertinent Vitals/Pain  Pain Assessment Pain Assessment: Faces Faces Pain Scale: Hurts even more Breathing: normal Negative Vocalization: none Facial Expression: smiling or inexpressive Body Language: relaxed Consolability: no need to console PAINAD Score: 0 Pain Location: right hand > B ankles Pain Descriptors / Indicators: Sore, Tightness, Tender Pain Intervention(s): Limited activity within patient's tolerance, Monitored during session, Repositioned    Home Living                          Prior Function            PT Goals (current goals can now be found in the care plan section) Acute Rehab PT Goals Patient Stated Goal: less pain so he can get around Progress towards PT goals: Progressing toward goals    Frequency    Min 2X/week      PT Plan      Co-evaluation              AM-PAC PT 6 Clicks Mobility   Outcome Measure  Help needed turning from your back to your side while in a flat bed without using bedrails?: A Little Help needed moving from lying on your back to sitting on the side of a flat bed without using bedrails?: A Lot Help needed moving to and from a bed to a chair (including a wheelchair)?: Total Help needed standing up from a chair using your arms (e.g., wheelchair or bedside chair)?: Total Help needed to walk in hospital room?: Total Help needed climbing 3-5 steps with a railing? : Total 6 Click Score: 9    End of Session Equipment Utilized During Treatment: Gait belt Activity Tolerance: Patient limited by pain;Patient limited by fatigue Patient left: with call bell/phone within reach;in chair;with chair alarm set Nurse Communication: Mobility status;Need for lift equipment PT Visit Diagnosis: Unsteadiness on feet (R26.81);Muscle weakness (generalized) (M62.81);Difficulty in walking, not elsewhere classified (R26.2);Pain;Other symptoms and signs involving the nervous system (R29.898) Pain - Right/Left:  (Bilateral) Pain - part of body: Ankle and  joints of foot     Time: 8968-8956 PT Time Calculation (min) (ACUTE ONLY): 12 min  Charges:    $Therapeutic Activity: 8-22 mins PT General Charges $$ ACUTE PT VISIT: 1 Visit                     Jazariah Teall M,PT Acute Rehab Services [phone]    Stephane JULIANNA Bevel 04/15/2024, 1:44 PM

## 2024-04-16 DIAGNOSIS — G9340 Encephalopathy, unspecified: Secondary | ICD-10-CM | POA: Diagnosis not present

## 2024-04-16 LAB — GLUCOSE, CAPILLARY
Glucose-Capillary: 102 mg/dL — ABNORMAL HIGH (ref 70–99)
Glucose-Capillary: 124 mg/dL — ABNORMAL HIGH (ref 70–99)
Glucose-Capillary: 89 mg/dL (ref 70–99)
Glucose-Capillary: 129 mg/dL — ABNORMAL HIGH (ref 70–99)

## 2024-04-16 NOTE — Plan of Care (Signed)

## 2024-04-16 NOTE — Progress Notes (Signed)
 Occupational Therapy Treatment Patient Details Name: Kenneth Joseph MRN: [id number] DOB: [birth year] Today's Date: 04/16/2024   History of present illness Pt is a 68 y.o. male presenting 11/9 to [facility] with AMS from [facility] rehab. MRI brain negative. Started on high dose thiamine. Recent admission in 10/15-23/2025 for colitis and acute neuropathic LE pain. PMH: DM , hiatal hernia, HTN, vitamin B 12 deficiency, alcohol use, peripheral neuropathy, gout   OT comments  Patient demonstrating good gains with OT treatment. Patient able to perform transfer with RW with mod assist for sit to stand due to unable to use RUE to assist.  Patient states he continues to have difficulty feeding self with RUE. Red foam provided to assist with grip and patient was able to manage utensils.  Patient remained in recliner with sister present.  Patient will benefit from continued inpatient follow up therapy, <3 hours/day. Acute OT to continue to follow to address established goals to facilitate DC to next venue of care.        If plan is discharge home, recommend the following:  Assistance with cooking/housework;Assist for transportation;Direct supervision/assist for medications management;Direct supervision/assist for financial management;Help with stairs or ramp for entrance;A lot of help with walking and/or transfers;A lot of help with bathing/dressing/bathroom   Equipment Recommendations  BSC/3in1;Wheelchair (measurements OT);Wheelchair cushion (measurements OT)    Recommendations for Other Services      Precautions / Restrictions Precautions Precautions: Fall Recall of Precautions/Restrictions: Intact Restrictions Weight Bearing Restrictions Per Provider Order: No       Mobility Bed Mobility Overal bed mobility: Needs Assistance Bed Mobility: Supine to Sit     Supine to sit: Mod assist     General bed mobility comments: increased time and able to use RUE to assist     Transfers Overall transfer level: Needs assistance Equipment used: Rolling walker (2 wheels) Transfers: Sit to/from Stand, Bed to chair/wheelchair/BSC Sit to Stand: Mod assist     Step pivot transfers: Mod assist     General transfer comment: assistance with balance and walker management     Balance Overall balance assessment: Needs assistance Sitting-balance support: No upper extremity supported, Feet supported, Bilateral upper extremity supported, Single extremity supported Sitting balance-Leahy Scale: Poor Sitting balance - Comments: CGA   Standing balance support: During functional activity, Bilateral upper extremity supported, Reliant on assistive device for balance Standing balance-Leahy Scale: Poor Standing balance comment: reliant on BUE support                           ADL either performed or assessed with clinical judgement   ADL Overall ADL's : Needs assistance/impaired Eating/Feeding: Set up;With adaptive utensils;Sitting Eating/Feeding Details (indicate cue type and reason): able to use Right hand with built up grip Grooming: Set up;Sitting           Upper Body Dressing : Contact guard assist;Sitting Upper Body Dressing Details (indicate cue type and reason): gown Lower Body Dressing: Maximal assistance Lower Body Dressing Details (indicate cue type and reason): socks                    Extremity/Trunk Assessment Upper Extremity Assessment RUE Sensation: history of peripheral neuropathy RUE Coordination: decreased fine motor;decreased gross motor            Vision       Perception     Praxis     Communication Communication Communication: No apparent difficulties   Cognition Arousal: Alert  Behavior During Therapy: WFL for tasks assessed/performed Cognition: Cognition impaired, No family/caregiver present to determine baseline       Memory impairment (select all impairments): Short-term memory   Executive  functioning impairment (select all impairments): Reasoning, Problem solving OT - Cognition Comments: cognition improving                 Following commands: Intact        Cueing   Cueing Techniques: Verbal cues, Tactile cues, Other (comments)  Exercises      Shoulder Instructions       General Comments      Pertinent Vitals/ Pain       Pain Assessment Pain Assessment: Faces Faces Pain Scale: Hurts little more Pain Location: right hand > B ankles Pain Descriptors / Indicators: Sore, Tightness, Tender Pain Intervention(s): Limited activity within patient's tolerance, Monitored during session, Repositioned  Home Living                                          Prior Functioning/Environment              Frequency  Min 2X/week        Progress Toward Goals  OT Goals(current goals can now be found in the care plan section)  Progress towards OT goals: Progressing toward goals  Acute Rehab OT Goals Patient Stated Goal: to have less pain and domore OT Goal Formulation: With patient Time For Goal Achievement: 04/23/24 Potential to Achieve Goals: Good ADL Goals Pt Will Perform Grooming: with contact guard assist;standing Pt Will Perform Lower Body Bathing: with min assist;sitting/lateral leans;sit to/from stand (with adaptive equipment as needed) Pt Will Perform Upper Body Dressing: with modified independence;sitting (including clothing fasteners; with adaptive equipment as needed) Pt Will Perform Lower Body Dressing: with min assist;sit to/from stand Pt Will Transfer to Toilet: with min assist;stand pivot transfer Pt Will Perform Toileting - Clothing Manipulation and hygiene: with min assist;sit to/from stand Additional ADL Goal #1: pt will follow 3 step commands to optimize independence in ADL and IADL  Plan      Co-evaluation                 AM-PAC OT 6 Clicks Daily Activity     Outcome Measure   Help from another person  eating meals?: A Little Help from another person taking care of personal grooming?: A Little Help from another person toileting, which includes using toliet, bedpan, or urinal?: A Lot Help from another person bathing (including washing, rinsing, drying)?: A Lot Help from another person to put on and taking off regular upper body clothing?: A Lot Help from another person to put on and taking off regular lower body clothing?: A Lot 6 Click Score: 14    End of Session Equipment Utilized During Treatment: Gait belt;Rolling walker (2 wheels)  OT Visit Diagnosis: Unsteadiness on feet (R26.81);Other abnormalities of gait and mobility (R26.89);Muscle weakness (generalized) (M62.81);History of falling (Z91.81);Ataxia, unspecified (R27.0);Other symptoms and signs involving cognitive function;Pain Pain - Right/Left: Right Pain - part of body: Hand   Activity Tolerance Patient tolerated treatment well   Patient Left in chair;with call bell/phone within reach;with family/visitor present;with chair alarm set   Nurse Communication Mobility status        Time: 8957-8889 OT Time Calculation (min): 28 min  Charges: OT General Charges $OT Visit: 1 Visit OT Treatments $Self Care/Home Management :  8-22 mins $Therapeutic Activity: 8-22 mins  Dick Laine, OTA Acute Rehabilitation Services  Office [phone]   Jeb LITTIE Laine 04/16/2024, 1:15 PM

## 2024-04-16 NOTE — Progress Notes (Signed)
 Mobility Specialist: Progress Note   04/16/24 1500  Mobility  Activity Pivoted/transferred from chair to bed  Level of Assistance Moderate assist, patient does 50-74%  Assistive Device Front wheel walker  Activity Response Tolerated well  Mobility Referral Yes  Mobility visit 1 Mobility  Mobility Specialist Start Time (ACUTE ONLY) 1215  Mobility Specialist Stop Time (ACUTE ONLY) 1229  Mobility Specialist Time Calculation (min) (ACUTE ONLY) 14 min    Pt received in chair, requesting assistance back to bed. Heavy modA for STS and stand pivot back to bed. MinA for sit>supine. Left in bed with all needs met, call bell in reach. Sister present.   Kenneth Joseph Mobility Specialist Please contact via SecureChat or Rehab office at [phone]

## 2024-04-16 NOTE — Progress Notes (Signed)
 PROGRESS NOTE    Kenneth Joseph  FMW:[id number]  DOB: 07-04-1955  DOA: 04/07/2024 PCP: Claudene Pellet, MD Outpatient Specialists:   Hospital course:  Patient is a 68 year old male past medical history for alcohol abuse in remission, DM-2, HTN, B12 deficiency, gout and peripheral polyneuropathy.  Patient was brought to ED from SNF with altered mental status, and admitted for acute encephalopathy.  Encephalopathy has resolved significantly.   Subjective:  Patient states he is doing well has no complaints, is waiting to be discharged when a bed is available.  Denies confusion.  Notes his right wrist pain is improving, is now able to close his hand some but not yet make a fist.   Objective: Vitals:   04/16/24 0516 04/16/24 0816 04/16/24 1118 04/16/24 1601  BP: 116/75 111/69 109/76 119/77  Pulse: 64 68 84 84  Resp:  18 18   Temp: 97.9 F (36.6 C) 97.9 F (36.6 C) 97.6 F (36.4 C) 98.2 F (36.8 C)  TempSrc:    Oral  SpO2: 90% 92% 94% 91%    Intake/Output Summary (Last 24 hours) at 04/16/2024 1744 Last data filed at 04/16/2024 1105 Gross per 24 hour  Intake 476 ml  Output 500 ml  Net -24 ml   There were no vitals filed for this visit.   Exam:  General: Friendly gentleman lying in bed in NAD Eyes: sclera anicteric, conjuctiva mild injection bilaterally CVS: S1-S2, regular  Respiratory:  decreased air entry bilaterally secondary to decreased inspiratory effort, rales at bases  GI: NABS, soft, NT  LE: Warm and well-perfused R wrist with mild swelling over MCP and dorsum of hand some stiffness and decreased strength secondary to pain Neuro: A/O x 3,  grossly nonfocal.  Psych: patient is logical and coherent, judgement and insight appear normal, mood and affect appropriate to situation.  Data Reviewed:  Basic Metabolic Panel: Recent Labs  Lab 04/10/24 0226 04/11/24 0455 04/12/24 0455  NA 137 136 136  K 4.3 4.4 4.3  CL 102 101 100  CO2 24 23 24   GLUCOSE 152*  119* 107*  BUN 13 15 15   CREATININE 1.06 0.99 0.99  CALCIUM 8.6* 8.9 8.7*  MG 2.0 2.0  --   PHOS  --  3.9  --     CBC: Recent Labs  Lab 04/10/24 0226 04/11/24 0455 04/12/24 0455  WBC 8.3 9.3 7.8  HGB 11.0* 11.4* 10.9*  HCT 33.4* 34.7* 33.3*  MCV 97.7 98.0 96.5  PLT 271 285 288     Scheduled Meds:  atorvastatin  80 mg Oral Daily   enoxaparin (LOVENOX) injection  40 mg Subcutaneous Q24H   feeding supplement  237 mL Oral BID BM   folic acid  1 mg Oral Daily   insulin  aspart  0-5 Units Subcutaneous QHS   insulin  aspart  0-9 Units Subcutaneous TID WC   metFORMIN  500 mg Oral Q breakfast   multivitamin with minerals  1 tablet Oral Daily   pregabalin  50 mg Oral BID   thiamine  100 mg Oral Daily   Continuous Infusions:   Assessment & Plan:   Right wrist pain onset 11/12 History of gout Attributed to infiltration of IV site Uric acid was WNL at 5.8 on 11/16 X-ray is without any bony abnormalities Patient was treated with Bactrim and Toradol  x 1 Pain and function are improving slowly  Acute metabolic encephalopathy--resolved Thought to be secondary to polypharmacy, Warnicke's encephalopathy volume depletion and delirium CT brain and MRI are negative Improvement  with high-dose thiamine  History of alcohol use Continue thiamine and folate  DM 2 Blood sugars under excellent control on present regimen  Peripheral neuropathy Continue Lyrica  Physical debilitation Awaiting placement at SNF    DVT prophylaxis: Lovenox Code Status: Full Family Communication: None today     Studies: No results found.  Principal Problem:   Acute encephalopathy Active Problems:   Folate deficiency   Altered mental status   Generalized weakness   Macrocytic anemia   Alcohol-induced polyneuropathy   Alcohol use disorder in remission   Wernicke encephalopathy     Kenneth Joseph, [facility]  If 7PM-7AM, please contact  night-coverage [email or web address]   LOS: 9 days

## 2024-04-17 LAB — GLUCOSE, CAPILLARY
Glucose-Capillary: 129 mg/dL — ABNORMAL HIGH (ref 70–99)
Glucose-Capillary: 135 mg/dL — ABNORMAL HIGH (ref 70–99)
Glucose-Capillary: 146 mg/dL — ABNORMAL HIGH (ref 70–99)
Glucose-Capillary: 131 mg/dL — ABNORMAL HIGH (ref 70–99)

## 2024-04-17 NOTE — Progress Notes (Signed)
 PROGRESS NOTE    Kenneth Joseph  FMW:[id number] DOB: 12-Nov-1955 DOA: 04/07/2024 PCP: Claudene Pellet, MD    Brief Narrative:  Patient is a 68 year old male with PMHx of alcohol abuse intermission, T2DM, HTN, B12 deficiency, gout, peripheral neuropathy who presented to the ED from SNF with altered mental status, admitted for acute encephalopathy.    Assessment and Plan:  # Acute metabolic encephalopathy - resolved - Likely multifactorial etiology including play forms, Warnicke's encephalopathy, volume dependent, delirium. - CT/MRI brain negative. - Overall workup has been unremarkable including negative alcohol levels - Neurology evaluated patient, no exact etiology identified, thought possibly delirium related, recommended continuation of thiamine and folate supplementation - Continue supplementation of folic acid, thiamine, and multivitamin  # Right wrist pain # History of gout - Right wrist pain initially reported on 11/12 - RUE US  negative for DVTs or SVTs - Thought to be secondary to IV site infiltration - Uric acid was within normal limits at 5.8 on 11/16 - Completed 5 days of Bactrim - X-ray without any bony abnormalities - Pain improving, though prohibiting complete fist formation on right - Working with OT  # History of alcohol abuse - Continue thiamine, folate, and MV supplementation  # T2DM - Continue metformin and SSI  # Neuropathy - Continue lyrica 50 mg BID   DVT prophylaxis: enoxaparin (LOVENOX) injection 40 mg Start: 04/08/24 1000   Code Status:   Code Status: Full Code  Family Communication: None at bedside  Disposition Plan: Pending insurance authorization for [employer] and Rehab PT - Follow Up Recommendations: Skilled nursing-short term rehab (<3 hours/day) (from [facility]) OT - Follow Up Recommendations: Skilled nursing-short term rehab (<3 hours/day)  DME Needs: PT equipment: None recommended by PT    Level of care:  Telemetry  Consultants:  Neurology  Procedures:  None  Antimicrobials: None    Subjective: Patient examined at bedside.  Overall feels doing well.  Still has mild pain in his right wrist limiting his range of motion and forming a full fist.  Otherwise has no complaints.  Objective: Vitals:   04/17/24 0347 04/17/24 0741 04/17/24 1240 04/17/24 1616  BP: 106/70 127/79 114/81 111/72  Pulse: 71 74 80 74  Resp: 18 20 20 20   Temp: 98.6 F (37 C) (!) 97.5 F (36.4 C) 98 F (36.7 C) 98.2 F (36.8 C)  TempSrc: Oral     SpO2: 96% 97% 96% 94%    Intake/Output Summary (Last 24 hours) at 04/17/2024 1819 Last data filed at 04/17/2024 0850 Gross per 24 hour  Intake 118 ml  Output 500 ml  Net -382 ml   There were no vitals filed for this visit.  Examination:  General exam: Appears calm and comfortable  Respiratory system: No increased WOB. CTAB. Cardiovascular system: +S1/S2, RRR. No JVD or murmurs. No pedal edema. Gastrointestinal system: Soft, NTND. No masses felt. Normal bowel sounds. Central nervous system: Alert and oriented. No focal neurological deficits. Extremities: Right wrist with mild swelling over MCP (though patient reports significantly improved from prior), ROM in forming fist limited due to pain Skin: No rashes, lesions or ulcers Psychiatry: Judgement and insight appear normal. Mood & affect appropriate.     Data Reviewed: I have personally reviewed following labs and imaging studies  CBC: Recent Labs  Lab 04/11/24 0455 04/12/24 0455  WBC 9.3 7.8  HGB 11.4* 10.9*  HCT 34.7* 33.3*  MCV 98.0 96.5  PLT 285 288   Basic Metabolic Panel: Recent Labs  Lab 04/11/24 0455  04/12/24 0455  NA 136 136  K 4.4 4.3  CL 101 100  CO2 23 24  GLUCOSE 119* 107*  BUN 15 15  CREATININE 0.99 0.99  CALCIUM  8.9 8.7*  MG 2.0  --   PHOS 3.9  --    GFR: CrCl cannot be calculated (Unknown ideal weight.). Liver Function Tests: No results for input(s): AST, ALT,  ALKPHOS, BILITOT, PROT, ALBUMIN in the last 168 hours. No results for input(s): LIPASE, AMYLASE in the last 168 hours. No results for input(s): AMMONIA in the last 168 hours. Coagulation Profile: No results for input(s): INR, PROTIME in the last 168 hours. Cardiac Enzymes: No results for input(s): CKTOTAL, CKMB, CKMBINDEX, TROPONINI in the last 168 hours. BNP (last 3 results) No results for input(s): PROBNP in the last 8760 hours. HbA1C: No results for input(s): HGBA1C in the last 72 hours. CBG: Recent Labs  Lab 04/16/24 1634 04/16/24 2046 04/17/24 0741 04/17/24 1235 04/17/24 1617  GLUCAP 89 129* 129* 135* 146*   Lipid Profile: No results for input(s): CHOL, HDL, LDLCALC, TRIG, CHOLHDL, LDLDIRECT in the last 72 hours. Thyroid  Function Tests: No results for input(s): TSH, T4TOTAL, FREET4, T3FREE, THYROIDAB in the last 72 hours. Anemia Panel: No results for input(s): VITAMINB12, FOLATE, FERRITIN, TIBC, IRON, RETICCTPCT in the last 72 hours. Sepsis Labs: No results for input(s): PROCALCITON, LATICACIDVEN in the last 168 hours.  No results found for this or any previous visit (from the past 240 hours).   Radiology Studies: No results found.  Scheduled Meds:  atorvastatin   80 mg Oral Daily   enoxaparin  (LOVENOX ) injection  40 mg Subcutaneous Q24H   feeding supplement  237 mL Oral BID BM   folic acid   1 mg Oral Daily   insulin  aspart  0-5 Units Subcutaneous QHS   insulin  aspart  0-9 Units Subcutaneous TID WC   metFORMIN   500 mg Oral Q breakfast   multivitamin with minerals  1 tablet Oral Daily   pregabalin   50 mg Oral BID   thiamine   100 mg Oral Daily   Continuous Infusions:   LOS:  LOS: 10 days   Time Spent: 35 minutes  Unresulted Labs (From admission, onward)    None        Adar Rase Al-Sultani, MD [facility]  If 7PM-7AM, please contact night-coverage  04/17/2024, 6:19 PM

## 2024-04-17 NOTE — Progress Notes (Signed)
 Mobility Specialist: Progress Note   04/17/24 1600  Mobility  Activity Ambulated with assistance  Level of Assistance Minimal assist, patient does 75% or more  Assistive Device Front wheel walker  Distance Ambulated (ft) 3 ft (x3)  Activity Response Tolerated well  Mobility Referral Yes  Mobility visit 1 Mobility  Mobility Specialist Start Time (ACUTE ONLY) 1053  Mobility Specialist Stop Time (ACUTE ONLY) 1103  Mobility Specialist Time Calculation (min) (ACUTE ONLY) 10 min    Pt received in bed, agreeable to mobility session. SV for bed mobility. ModA for STS. Able to progress to ambulation this session with minA. Completed 3 bouts with a seated break in between each; walked 3' forward and backward. Mod cues for step length and RW proximity, gait improved with each bout. Afterwards agreed to sit in the chair for an hour. Left in chair with all needs met, call bell in reach. Chair alarm on.   Ileana Lute Mobility Specialist Please contact via SecureChat or Rehab office at [phone]

## 2024-04-17 NOTE — TOC Progression Note (Signed)
 Transition of Care [facility]) - Progression Note    Patient Details  Name: Kenneth Joseph MRN: [id number] Date of Birth: 01/05/1956  [facility] of [facility]) CM/SW Contact  Sherline Clack, [place] Phone Number: 04/17/2024, 4:32 PM  Clinical Narrative:     Shara submitted for [facility] Rehab, auth ID: [id number].  Expected Discharge: Skilled Nursing Facility Barriers to Discharge: Insurance Authorization                      Expected Discharge Plan and Services                                               Social Drivers of Health (SDOH) Interventions SDOH Screenings   Food Insecurity: No Food Insecurity (04/08/2024)  Housing: Low Risk  (04/08/2024)  Transportation Needs: No Transportation Needs (04/08/2024)  Utilities: Not At Risk (04/08/2024)  Depression (PHQ2-9): Low Risk  (02/19/2024)  Social Connections: Socially Isolated (04/08/2024)  Tobacco Use: Low Risk  (03/14/2024)    Readmission Risk Interventions     No data to display

## 2024-04-17 NOTE — Plan of Care (Signed)

## 2024-04-17 NOTE — Plan of Care (Signed)

## 2024-04-17 NOTE — Progress Notes (Signed)
 Mobility Specialist: Progress Note   04/17/24 1611  Mobility  Activity Pivoted/transferred from chair to bed  Level of Assistance Minimal assist, patient does 75% or more  Assistive Device Front wheel walker  Activity Response Tolerated well  Mobility Referral Yes  Mobility visit 1 Mobility  Mobility Specialist Start Time (ACUTE ONLY) 1153  Mobility Specialist Stop Time (ACUTE ONLY) 1158  Mobility Specialist Time Calculation (min) (ACUTE ONLY) 5 min    Pt received in chair requesting assistance back to bed. ModA for STS, minA for stand pivot to bed. SV for sit>supine. Left in bed with all needs met, call bell in reach. Bed alarm on.   Ileana Lute Mobility Specialist Please contact via SecureChat or Rehab office at [phone]

## 2024-04-18 LAB — GLUCOSE, CAPILLARY
Glucose-Capillary: 143 mg/dL — ABNORMAL HIGH (ref 70–99)
Glucose-Capillary: 124 mg/dL — ABNORMAL HIGH (ref 70–99)
Glucose-Capillary: 148 mg/dL — ABNORMAL HIGH (ref 70–99)
Glucose-Capillary: 132 mg/dL — ABNORMAL HIGH (ref 70–99)

## 2024-04-18 NOTE — Progress Notes (Signed)
 PROGRESS NOTE    Kenneth Joseph  FMW:[id number] DOB: 08/03/1955 DOA: 04/07/2024 PCP: Claudene Pellet, MD    Brief Narrative:  Patient is a 68 year old male with PMHx of alcohol abuse intermission, T2DM, HTN, B12 deficiency, gout, peripheral neuropathy who presented to the ED from SNF with altered mental status, admitted for acute encephalopathy.    Assessment and Plan:  # Acute metabolic encephalopathy - resolved - Likely multifactorial etiology including play forms, Warnicke's encephalopathy, volume dependent, delirium. - CT/MRI brain negative. - Overall workup has been unremarkable including negative alcohol levels - Neurology evaluated patient, no exact etiology identified, thought possibly delirium related, recommended continuation of thiamine and folate supplementation - Continue supplementation of folic acid, thiamine, and multivitamin  # Right wrist pain # History of gout - Right wrist pain initially reported on 11/12 - RUE US  negative for DVTs or SVTs - Thought to be secondary to IV site infiltration - Uric acid was within normal limits at 5.8 on 11/16 - Completed 5 days of Bactrim - X-ray without any bony abnormalities - Pain improving, though prohibiting complete fist formation on right - Working with OT  # History of alcohol abuse - Continue thiamine, folate, and MV supplementation  # T2DM - Continue metformin and SSI  # Neuropathy - Continue lyrica 50 mg BID   DVT prophylaxis: enoxaparin (LOVENOX) injection 40 mg Start: 04/08/24 1000   Code Status:   Code Status: Full Code  Family Communication: None at bedside  Disposition Plan: Received insurance authorization for [facility] and Rehab this afternoon. Patient to be discharged tomorrow.  PT - Follow Up Recommendations: Skilled nursing-short term rehab (<3 hours/day) (from [facility]) OT - Follow Up Recommendations: Skilled nursing-short term rehab (<3 hours/day)  DME Needs: PT equipment: None  recommended by PT    Level of care: Telemetry  Consultants:  Neurology  Procedures:  None  Antimicrobials: None    Subjective: Patient examined at bedside.  Doing well, continues to have R > L wrist/hand pain, with the right radiating up the forearm and prohibiting fist formation. No other changes or new complaints.  Objective: Vitals:   04/18/24 0110 04/18/24 0636 04/18/24 0637 04/18/24 0753  BP: 115/72 108/79 108/79 123/80  Pulse: 76 72 71 78  Resp:    17  Temp: 98 F (36.7 C) 98.5 F (36.9 C) 98.5 F (36.9 C) 97.7 F (36.5 C)  TempSrc:      SpO2: 93% 98% 98% 91%    Intake/Output Summary (Last 24 hours) at 04/18/2024 1023 Last data filed at 04/18/2024 1009 Gross per 24 hour  Intake 236 ml  Output --  Net 236 ml   There were no vitals filed for this visit.  Examination:  General exam: Appears calm and comfortable  Respiratory system: No increased WOB. CTAB. Cardiovascular system: +S1/S2, RRR. No JVD or murmurs. No pedal edema. Gastrointestinal system: Soft, NTND. No masses felt. Normal bowel sounds. Central nervous system: Alert and oriented. No focal neurological deficits. Extremities: Right wrist with mild swelling over MCP (though patient reports significantly improved from prior), ROM in forming fist limited due to pain. Able to form fist on the left.  Skin: No rashes, lesions or ulcers Psychiatry: Judgement and insight appear normal. Mood & affect appropriate.     Data Reviewed: I have personally reviewed following labs and imaging studies  CBC: Recent Labs  Lab 04/12/24 0455  WBC 7.8  HGB 10.9*  HCT 33.3*  MCV 96.5  PLT 288   Basic Metabolic Panel:  Recent Labs  Lab 04/12/24 0455  NA 136  K 4.3  CL 100  CO2 24  GLUCOSE 107*  BUN 15  CREATININE 0.99  CALCIUM 8.7*   GFR: CrCl cannot be calculated (Unknown ideal weight.). Liver Function Tests: No results for input(s): AST, ALT, ALKPHOS, BILITOT, PROT, ALBUMIN in the  last 168 hours. No results for input(s): LIPASE, AMYLASE in the last 168 hours. No results for input(s): AMMONIA in the last 168 hours. Coagulation Profile: No results for input(s): INR, PROTIME in the last 168 hours. Cardiac Enzymes: No results for input(s): CKTOTAL, CKMB, CKMBINDEX, TROPONINI in the last 168 hours. BNP (last 3 results) No results for input(s): PROBNP in the last 8760 hours. HbA1C: No results for input(s): HGBA1C in the last 72 hours. CBG: Recent Labs  Lab 04/17/24 0741 04/17/24 1235 04/17/24 1617 04/17/24 2106 04/18/24 0754  GLUCAP 129* 135* 146* 131* 143*   Lipid Profile: No results for input(s): CHOL, HDL, LDLCALC, TRIG, CHOLHDL, LDLDIRECT in the last 72 hours. Thyroid  Function Tests: No results for input(s): TSH, T4TOTAL, FREET4, T3FREE, THYROIDAB in the last 72 hours. Anemia Panel: No results for input(s): VITAMINB12, FOLATE, FERRITIN, TIBC, IRON, RETICCTPCT in the last 72 hours. Sepsis Labs: No results for input(s): PROCALCITON, LATICACIDVEN in the last 168 hours.  No results found for this or any previous visit (from the past 240 hours).   Radiology Studies: No results found.  Scheduled Meds:  atorvastatin  80 mg Oral Daily   enoxaparin (LOVENOX) injection  40 mg Subcutaneous Q24H   feeding supplement  237 mL Oral BID BM   folic acid  1 mg Oral Daily   insulin  aspart  0-5 Units Subcutaneous QHS   insulin  aspart  0-9 Units Subcutaneous TID WC   metFORMIN  500 mg Oral Q breakfast   multivitamin with minerals  1 tablet Oral Daily   pregabalin  50 mg Oral BID   thiamine  100 mg Oral Daily   Continuous Infusions:   LOS:  LOS: 11 days   Time Spent: 35 minutes  Unresulted Labs (From admission, onward)    None        Latessa Tillis Al-Sultani, MD [facility]  If 7PM-7AM, please contact night-coverage  04/18/2024, 10:23 AM

## 2024-04-18 NOTE — NC FL2 (Signed)
 [place]  MEDICAID [id number] LEVEL OF CARE FORM     IDENTIFICATION  Patient Name: Kenneth Joseph Birthdate: 1955/06/16 Sex: male Admission Date (Current Location): 04/07/2024  [facility] and [place] Number:  Producer, Television/film/video and Address:  The . [facility], 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   Alcohol use disorder in remission 04/07/2024   Wernicke encephalopathy 04/07/2024   Colitis 03/14/2024   Alcohol abuse 03/14/2024   Elevated ferritin 02/19/2024   Low vitamin D level 02/19/2024   Abnormal level of blood mineral 02/19/2024   Current drinker of alcohol 02/19/2024   Diverticulosis of colon 02/19/2024   Gout 02/19/2024   Hardening of the aorta (main artery of the heart) 02/19/2024   Hypercholesterolemia 02/19/2024   Hypertension 02/19/2024   Hypocalcemia 02/19/2024   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   Left lower quadrant abdominal pain 02/19/2024   Loss of appetite 02/19/2024   Macrocytosis 02/19/2024   Peripheral polyneuropathy 02/19/2024   Diabetes (HCC) 02/19/2024   Type 2 diabetes mellitus with other specified complication (HCC) 02/19/2024   Type 2 diabetes mellitus without complications (HCC) 02/19/2024   Folate deficiency 02/19/2024    Orientation RESPIRATION BLADDER Height & Weight     Self, Time, Situation  Normal Incontinent  Weight:   Height:     BEHAVIORAL SYMPTOMS/MOOD NEUROLOGICAL BOWEL NUTRITION STATUS      Continent Diet (Diet Carb Modified)  AMBULATORY STATUS COMMUNICATION OF NEEDS Skin   Limited Assist Verbally Normal                       Personal Care Assistance Level of Assistance  Bathing, Feeding, Dressing Bathing Assistance: Limited assistance Feeding assistance: Independent Dressing Assistance: Limited assistance     Functional Limitations Info  Sight, Hearing, Speech Sight Info: Adequate Hearing Info: Adequate Speech Info: Adequate    SPECIAL CARE FACTORS FREQUENCY  PT (By licensed PT), OT (By licensed OT)     PT Frequency: 5x/week OT Frequency: 5x/week            Contractures Contractures Info: Not present    Additional Factors Info  Code Status, Allergies Code Status Info: Full Allergies Info: Colchicine           Current Medications (04/18/2024):  This is the current hospital active medication list Current Facility-Administered Medications  Medication Dose Route Frequency Provider Last Rate Last Admin   acetaminophen  (TYLENOL ) tablet 650 mg  650 mg Oral Q6H PRN Amponsah, Prosper M, MD   650 mg at 04/17/24 1724   Or   acetaminophen  (TYLENOL ) suppository 650 mg  650 mg Rectal Q6H PRN Amponsah, Prosper M, MD       atorvastatin (LIPITOR) tablet 80 mg  80 mg Oral Daily Amponsah, Prosper M, MD   80 mg at 04/18/24 0828   bisacodyl (DULCOLAX) EC tablet 5 mg  5 mg Oral Daily PRN Amponsah, Prosper  M, MD       enoxaparin (LOVENOX) injection 40 mg  40 mg Subcutaneous Q24H Amponsah, Prosper M, MD   40 mg at 04/18/24 9171   feeding supplement (ENSURE PLUS HIGH PROTEIN) liquid 237 mL  237 mL Oral BID BM Amponsah, Prosper M, MD   237 mL at 04/18/24 9170   folic acid (FOLVITE) tablet 1 mg  1 mg Oral Daily Amponsah, Prosper M, MD   1 mg at 04/18/24 9171   insulin  aspart (novoLOG ) injection 0-5 Units  0-5 Units Subcutaneous QHS Amponsah, Prosper M, MD       insulin  aspart  (novoLOG ) injection 0-9 Units  0-9 Units Subcutaneous TID WC Amponsah, Prosper M, MD   1 Units at 04/18/24 0804   LORazepam (ATIVAN) injection 1 mg  1 mg Intravenous Once PRN Yao, David Hsienta, MD       metFORMIN (GLUCOPHAGE) tablet 500 mg  500 mg Oral Q breakfast Amponsah, Prosper M, MD   500 mg at 04/18/24 9171   multivitamin with minerals tablet 1 tablet  1 tablet Oral Daily Lou Claretta HERO, MD   1 tablet at 04/18/24 9171   ondansetron  (ZOFRAN ) tablet 4 mg  4 mg Oral Q6H PRN Amponsah, Prosper M, MD       Or   ondansetron  (ZOFRAN ) injection 4 mg  4 mg Intravenous Q6H PRN Amponsah, Prosper M, MD       pregabalin (LYRICA) capsule 50 mg  50 mg Oral BID Gonfa, Taye T, MD   50 mg at 04/18/24 9171   senna-docusate (Senokot-S) tablet 1 tablet  1 tablet Oral QHS PRN Amponsah, Prosper M, MD       thiamine (VITAMIN B1) tablet 100 mg  100 mg Oral Daily Arlon Honey W, DO   100 mg at 04/18/24 9171   traMADol (ULTRAM) tablet 50 mg  50 mg Oral Q6H PRN Arlon Honey ORN, DO   50 mg at 04/17/24 1725     Discharge Medications: Please see discharge summary for a list of discharge medications.  Relevant Imaging Results:  Relevant Lab Results:   Additional Information Pt's SSN: [id number]  Sherline Clack, [place]

## 2024-04-18 NOTE — Progress Notes (Signed)
 Physical Therapy Treatment Patient Details Name: Kenneth Joseph MRN: [id number] DOB: [birth year] Today's Date: 04/18/2024   History of Present Illness Pt is a 68 y.o. male presenting 11/9 to [facility] with AMS from [facility] rehab. MRI brain negative. Started on high dose thiamine. Recent admission in 10/15-23/2025 for colitis and acute neuropathic LE pain. PMH: DM , hiatal hernia, HTN, vitamin B 12 deficiency, alcohol use, peripheral neuropathy, gout    PT Comments  The pt is making good functional progress, now transferring to stand from the elevated EOB with only CGA-minA when using the stedy. The pt was also able to stand in the stedy for up to ~1.5 min durations. Thus, focused session on progressing towards gait training with initially having pt march in place in the stedy. Then after x2 bouts of this the pt progressed to transferring to stand with a RW and stepping anterior <> posterior repeatedly in front of the EOB. He needed min-modA to transfer to stand when using the RW and minA for balance to take steps at EOB. He is at high risk for falls, demonstrating deficits in lower extremity strength, power, and endurance along with deficits in balance. Will continue to follow acutely.    If plan is discharge home, recommend the following: A lot of help with bathing/dressing/bathroom;A lot of help with walking and/or transfers;Assistance with cooking/housework;Assist for transportation;Help with stairs or ramp for entrance   Can travel by private vehicle     No  Equipment Recommendations  None recommended by PT (defer to next venue of care)    Recommendations for Other Services       Precautions / Restrictions Precautions Precautions: Fall Recall of Precautions/Restrictions: Intact Restrictions Weight Bearing Restrictions Per Provider Order: No     Mobility  Bed Mobility Overal bed mobility: Needs Assistance Bed Mobility: Supine to Sit, Sit to Supine     Supine to sit:  Contact guard, HOB elevated, Used rails Sit to supine: Contact guard assist, HOB elevated, Used rails   General bed mobility comments: Extra time, but pt able to transition supine <> sit L EOB with HOB elevated with only CGA for safety    Transfers Overall transfer level: Needs assistance Equipment used: Ambulation equipment used, Rolling walker (2 wheels) Transfers: Sit to/from Stand Sit to Stand: Mod assist, Min assist, From elevated surface, Contact guard assist           General transfer comment: Pt stood 2x from elevated EOB to stedy with minA the first rep and CGA the second rep. Pt stood x2 more times from elevated EOB to RW with min-modA to power up, extend hips, and gain balance, cuing pt to push up with L hand on bed. Transfer via Lift Equipment: Stedy  Ambulation/Gait Ambulation/Gait assistance: Editor, Commissioning (Feet): 5 Feet (x2 bouts of ~5 ft each bout) Assistive device: Rolling walker (2 wheels) Gait Pattern/deviations: Decreased step length - right, Decreased step length - left, Shuffle, Trunk flexed, Step-to pattern, Decreased stride length Gait velocity: reduced Gait velocity interpretation: <1.31 ft/sec, indicative of household ambulator   General Gait Details: Began with pre-gait training, marching in stedy x2 bouts for ~15-30 sec each bout and CGA-minA for balance. Then progressed to using the RW and taking x1 step anterior <> posterior bil x5-6 reps bil x2 bouts. Pt took slow, shuffling steps, demonstrating some mild imbalance, needing minA for balance and safety   Optometrist  Tilt Bed    Modified Rankin (Stroke Patients Only)       Balance Overall balance assessment: Needs assistance Sitting-balance support: No upper extremity supported, Feet supported Sitting balance-Leahy Scale: Fair Sitting balance - Comments: CGA sitting statically EOB   Standing balance support: During functional activity,  Bilateral upper extremity supported, Reliant on assistive device for balance Standing balance-Leahy Scale: Poor Standing balance comment: reliant on UE support and CGA when standing statically in stedy, minA with stepping in stedy, minA when standing statically or stepping with RW                            Communication Communication Communication: No apparent difficulties  Cognition Arousal: Alert Behavior During Therapy: WFL for tasks assessed/performed   PT - Cognitive impairments: Safety/Judgement, Problem solving, Awareness                       PT - Cognition Comments: Needs cues to sequence steps during pre-gait training Following commands: Intact      Cueing Cueing Techniques: Verbal cues, Tactile cues  Exercises      General Comments        Pertinent Vitals/Pain Pain Assessment Pain Assessment: Faces Faces Pain Scale: Hurts even more Pain Location: right hand, L ankle Pain Descriptors / Indicators: Sore, Discomfort, Grimacing Pain Intervention(s): Monitored during session, Limited activity within patient's tolerance, Repositioned    Home Living                          Prior Function            PT Goals (current goals can now be found in the care plan section) Acute Rehab PT Goals Patient Stated Goal: less pain so he can get around PT Goal Formulation: With patient Time For Goal Achievement: 04/23/24 Potential to Achieve Goals: Fair Progress towards PT goals: Progressing toward goals    Frequency    Min 2X/week      PT Plan      Co-evaluation              AM-PAC PT 6 Clicks Mobility   Outcome Measure  Help needed turning from your back to your side while in a flat bed without using bedrails?: A Little Help needed moving from lying on your back to sitting on the side of a flat bed without using bedrails?: A Little Help needed moving to and from a bed to a chair (including a wheelchair)?: A Lot Help  needed standing up from a chair using your arms (e.g., wheelchair or bedside chair)?: A Lot Help needed to walk in hospital room?: Total Help needed climbing 3-5 steps with a railing? : Total 6 Click Score: 12    End of Session Equipment Utilized During Treatment: Gait belt Activity Tolerance: Patient tolerated treatment well Patient left: with call bell/phone within reach;in bed;with bed alarm set   PT Visit Diagnosis: Unsteadiness on feet (R26.81);Muscle weakness (generalized) (M62.81);Difficulty in walking, not elsewhere classified (R26.2);Pain;Other symptoms and signs involving the nervous system (R29.898) Pain - Right/Left:  (Bilateral) Pain - part of body: Ankle and joints of foot     Time: 8392-8378 PT Time Calculation (min) (ACUTE ONLY): 14 min  Charges:    $Gait Training: 8-22 mins PT General Charges $$ ACUTE PT VISIT: 1 Visit  Theo Ferretti, PT, DPT Acute Rehabilitation Services  Office: [phone]    Theo CHRISTELLA Ferretti 04/18/2024, 5:36 PM

## 2024-04-18 NOTE — Plan of Care (Signed)
  Problem: Education: Goal: Ability to describe self-care measures that may prevent or decrease complications (Diabetes Survival Skills Education) will improve Outcome: Progressing Goal: Individualized Educational Video(s) Outcome: Progressing   Problem: Coping: Goal: Ability to adjust to condition or change in health will improve Outcome: Progressing   Problem: Fluid Volume: Goal: Ability to maintain a balanced intake and output will improve Outcome: Progressing   Problem: Health Behavior/Discharge Planning: Goal: Ability to identify and utilize available resources and services will improve Outcome: Progressing Goal: Ability to manage health-related needs will improve Outcome: Progressing   Problem: Metabolic: Goal: Ability to maintain appropriate glucose levels will improve Outcome: Progressing   Problem: Nutritional: Goal: Maintenance of adequate nutrition will improve Outcome: Progressing Goal: Progress toward achieving an optimal weight will improve Outcome: Progressing   Problem: Skin Integrity: Goal: Risk for impaired skin integrity will decrease Outcome: Progressing   Problem: Tissue Perfusion: Goal: Adequacy of tissue perfusion will improve Outcome: Progressing   Problem: Education: Goal: Knowledge of General Education information will improve Description: Including pain rating scale, medication(s)/side effects and non-pharmacologic comfort measures Outcome: Progressing   Problem: Health Behavior/Discharge Planning: Goal: Ability to manage health-related needs will improve Outcome: Progressing   Problem: Clinical Measurements: Goal: Ability to maintain clinical measurements within normal limits will improve Outcome: Progressing Goal: Will remain free from infection Outcome: Progressing Goal: Diagnostic test results will improve Outcome: Progressing Goal: Respiratory complications will improve Outcome: Progressing Goal: Cardiovascular complication will  be avoided Outcome: Progressing   Problem: Activity: Goal: Risk for activity intolerance will decrease Outcome: Progressing   Problem: Nutrition: Goal: Adequate nutrition will be maintained Outcome: Progressing   Problem: Coping: Goal: Level of anxiety will decrease Outcome: Progressing   Problem: Elimination: Goal: Will not experience complications related to bowel motility Outcome: Progressing Goal: Will not experience complications related to urinary retention Outcome: Progressing   Problem: Pain Managment: Goal: General experience of comfort will improve and/or be controlled Outcome: Progressing   Problem: Skin Integrity: Goal: Risk for impaired skin integrity will decrease Outcome: Progressing

## 2024-04-18 NOTE — Plan of Care (Signed)

## 2024-04-18 NOTE — TOC Progression Note (Signed)
 Transition of Care [facility]) - Progression Note    Patient Details  Name: Kenneth Joseph MRN: [id number] Date of Birth: 1955-12-25  Transition of Care [facility]) CM/SW Contact  Sherline Clack, [place] Phone Number: 04/18/2024, 1:52 PM  Clinical Narrative:     Patient's auth for [employer] was approved 11/20-11/26. Auth ID: [id number].   Expected Discharge Plan: Skilled Nursing Facility Barriers to Discharge: Insurance Authorization               Expected Discharge Plan and Services       Living arrangements for the past 2 months: Single Family Home                                       Social Drivers of Health (SDOH) Interventions SDOH Screenings   Food Insecurity: No Food Insecurity (04/08/2024)  Housing: Low Risk  (04/08/2024)  Transportation Needs: No Transportation Needs (04/08/2024)  Utilities: Not At Risk (04/08/2024)  Depression (PHQ2-9): Low Risk  (02/19/2024)  Social Connections: Socially Isolated (04/08/2024)  Tobacco Use: Low Risk  (03/14/2024)    Readmission Risk Interventions     No data to display

## 2024-04-19 DIAGNOSIS — E538 Deficiency of other specified B group vitamins: Secondary | ICD-10-CM | POA: Diagnosis not present

## 2024-04-19 DIAGNOSIS — R404 Transient alteration of awareness: Secondary | ICD-10-CM | POA: Diagnosis not present

## 2024-04-19 DIAGNOSIS — G629 Polyneuropathy, unspecified: Secondary | ICD-10-CM | POA: Diagnosis not present

## 2024-04-19 DIAGNOSIS — Z7401 Bed confinement status: Secondary | ICD-10-CM | POA: Diagnosis not present

## 2024-04-19 DIAGNOSIS — I700 Atherosclerosis of aorta: Secondary | ICD-10-CM | POA: Diagnosis not present

## 2024-04-19 DIAGNOSIS — E119 Type 2 diabetes mellitus without complications: Secondary | ICD-10-CM | POA: Diagnosis not present

## 2024-04-19 DIAGNOSIS — D539 Nutritional anemia, unspecified: Secondary | ICD-10-CM | POA: Diagnosis not present

## 2024-04-19 DIAGNOSIS — R2689 Other abnormalities of gait and mobility: Secondary | ICD-10-CM | POA: Diagnosis not present

## 2024-04-19 DIAGNOSIS — E512 Wernicke's encephalopathy: Secondary | ICD-10-CM | POA: Diagnosis not present

## 2024-04-19 DIAGNOSIS — F1091 Alcohol use, unspecified, in remission: Secondary | ICD-10-CM | POA: Diagnosis not present

## 2024-04-19 DIAGNOSIS — I10 Essential (primary) hypertension: Secondary | ICD-10-CM | POA: Diagnosis not present

## 2024-04-19 DIAGNOSIS — E785 Hyperlipidemia, unspecified: Secondary | ICD-10-CM | POA: Diagnosis not present

## 2024-04-19 DIAGNOSIS — K529 Noninfective gastroenteritis and colitis, unspecified: Secondary | ICD-10-CM | POA: Diagnosis not present

## 2024-04-19 DIAGNOSIS — G9349 Other encephalopathy: Secondary | ICD-10-CM | POA: Diagnosis not present

## 2024-04-19 DIAGNOSIS — R41841 Cognitive communication deficit: Secondary | ICD-10-CM | POA: Diagnosis not present

## 2024-04-19 DIAGNOSIS — G9340 Encephalopathy, unspecified: Secondary | ICD-10-CM | POA: Diagnosis not present

## 2024-04-19 DIAGNOSIS — M109 Gout, unspecified: Secondary | ICD-10-CM | POA: Diagnosis not present

## 2024-04-19 DIAGNOSIS — K5730 Diverticulosis of large intestine without perforation or abscess without bleeding: Secondary | ICD-10-CM | POA: Diagnosis not present

## 2024-04-19 DIAGNOSIS — E7800 Pure hypercholesterolemia, unspecified: Secondary | ICD-10-CM | POA: Diagnosis not present

## 2024-04-19 DIAGNOSIS — M6281 Muscle weakness (generalized): Secondary | ICD-10-CM | POA: Diagnosis not present

## 2024-04-19 DIAGNOSIS — G622 Polyneuropathy due to other toxic agents: Secondary | ICD-10-CM | POA: Diagnosis not present

## 2024-04-19 LAB — GLUCOSE, CAPILLARY
Glucose-Capillary: 117 mg/dL — ABNORMAL HIGH (ref 70–99)
Glucose-Capillary: 173 mg/dL — ABNORMAL HIGH (ref 70–99)
Glucose-Capillary: 96 mg/dL (ref 70–99)

## 2024-04-19 MED ORDER — SENNOSIDES-DOCUSATE SODIUM 8.6-50 MG PO TABS: 1.0000 | ORAL_TABLET | Freq: Every evening | ORAL | Status: DC | PRN

## 2024-04-19 MED ORDER — BISACODYL 5 MG PO TBEC: 5.0000 mg | DELAYED_RELEASE_TABLET | Freq: Every day | ORAL | Status: AC | PRN

## 2024-04-19 MED ORDER — TRAMADOL HCL 50 MG PO TABS: 50.0000 mg | ORAL_TABLET | Freq: Four times a day (QID) | ORAL | 0 refills | Status: AC | PRN

## 2024-04-19 NOTE — Plan of Care (Signed)

## 2024-04-19 NOTE — TOC Progression Note (Signed)
 Transition of Care [facility]) - Progression Note    Patient Details  Name: Kenneth Joseph MRN: [id number] Date of Birth: 1956/02/18  Transition [facility] Care [facility]) CM/SW Contact  Sherline Clack, [place] Phone Number: 04/19/2024, 4:11 PM  Clinical Narrative:     Patient will DC to: [facility]  Anticipated DC date: 04/19/24  Family notified: sister and daughter Transport by: [facility]   Per MD patient ready for DC to [place]. RN to call report prior to discharge [phone], [phone]). RN, patient, patient's family, and facility notified of DC. Discharge Summary and FL2 sent to facility. DC packet on chart. Ambulance transport requested for patient.   CSW will sign off for now as social work intervention is no longer needed. Please consult us  again if new needs arise.    Expected Discharge Plan: Skilled Nursing Facility Barriers to Discharge: Barriers Resolved               Expected Discharge Plan and Services       Living arrangements for the past 2 months: Single Family Home Expected Discharge Date: 04/19/24                                     Social Drivers of Health (SDOH) Interventions SDOH Screenings   Food Insecurity: No Food Insecurity (04/08/2024)  Housing: Low Risk  (04/08/2024)  Transportation Needs: No Transportation Needs (04/08/2024)  Utilities: Not At Risk (04/08/2024)  Depression (PHQ2-9): Low Risk  (02/19/2024)  Social Connections: Socially Isolated (04/08/2024)  Tobacco Use: Low Risk  (03/14/2024)    Readmission Risk Interventions     No data to display

## 2024-04-19 NOTE — Discharge Summary (Signed)
 Physician Discharge Summary   Patient: Kenneth Joseph MRN: [id number] DOB: 1955-08-18  Admit date:     04/07/2024  Discharge date: 04/19/24  Discharge Physician: Duffy Al-Sultani   PCP: Claudene Pellet, MD   Recommendations at discharge:   Follow up with PCP for routine post hospital discharge follow up in 2 weeks or after discharge from rehab Follow up with neurology (already established) for continued evaluation and management as below within 4 weeks of discharge  Discharge Diagnoses: Principal Problem:   Acute encephalopathy Active Problems:   Folate deficiency   Altered mental status   Generalized weakness   Macrocytic anemia   Alcohol-induced polyneuropathy   Alcohol use disorder in remission   Wernicke encephalopathy   Hospital Course:  Patient is a 68 year old male with PMHx of alcohol abuse in remission, T2DM, HTN, B12 deficiency, gout, peripheral neuropathy who presented to the ED from SNF with altered mental status, admitted for acute encephalopathy.  The patient was recently admitted to [facility] for colitis and discharged to SNF for rehabilitation.  On admission, collateral obtained from his daughter and sister yielded that the patient was initially doing well but developed progressive cognitive decline over a weeks time prior to presentation with worsening comprehension, difficulty holding conversations, and inability to remember simple information. Over the same period he developed urinary incontinence, decreased ambulation after initial improvement, and poor oral intake.  On the day of presentation, his mental status worsened significantly.  In the ED he was oriented to name and DOB but disoriented to time and situation.  He also endorsed baseline neuropathic lower extremity pain and chills without fever, nausea, vomiting, abdominal pain, dyspnea, or dysuria.  Vitals: T90 9.3 F, HR 90-100, SBP 1 20-1 30s.  Labs notable for potassium 5.2, otherwise normal CBC aside for mild  anemia (Hgb 12.3).  Normal ammonia.  Normal renal and liver function.  Normal ethanol level.  UA negative for infection.  EKG showed sinus tachycardia.  CXR was unremarkable.  CT head showed no acute intracranial normality.  Neurology was consulted for acute metabolic encephalopathy.  He received 1 L NS and was started on IV thiamine .   Assessment and Plan:   # Acute metabolic encephalopathy - resolved - Likely multifactorial etiology including play forms, Wernicke's encephalopathy, volume depletion, delirium. - CT/MRI brain negative - Overall workup has been unremarkable including negative alcohol levels - Neurology evaluated patient, no exact etiology identified, thought possibly delirium related, recommended continuation of thiamine  and folate supplementation - Continue supplementation of folic acid , thiamine , and multivitamin   # Right wrist pain # History of gout - Right wrist pain initially reported on 11/12 - RUE US  negative for DVTs or SVTs - Thought to be secondary to IV site infiltration - Uric acid was within normal limits at 5.8 on 11/16 - Completed 5 days of Bactrim  - X-ray without any bony abnormalities - Pain improving, though prohibiting complete fist formation on right - Suspect neuropathic etiology as well given his history as well as concomitant pain sensation in left wrist, though not as pronounced. Would recommend follow up with his outpatient neurology provider within 4 weeks or sooner if symptoms do not improve or worsen. Could consider NCS and EMG.   # History of alcohol abuse - Continue thiamine , folate, and MV supplementation   # T2DM - Maintained on metformin  and SSI while hospitalized - Resumed home [place] on discharge   # Neuropathy - Continue lyrica  50 mg BID     Pain control - [place]   Controlled Substance Reporting System database was reviewed. and patient was instructed, not to drive, operate heavy machinery, perform activities at heights,  swimming or participation in water activities or provide baby-sitting services while on Pain, Sleep and Anxiety Medications; until their outpatient Physician has advised to do so again. Also recommended to not to take more than prescribed Pain, Sleep and Anxiety Medications.   Consultants: Neurology Procedures performed: None  Disposition: Skilled nursing facility Diet recommendation:  Diet Orders (From admission, onward)     Start     Ordered   04/07/24 2324  Diet Carb Modified Fluid consistency: Thin; Room service appropriate? Yes  Diet effective now       Question Answer Comment  Diet-HS Snack? Nothing   Calorie Level Medium 1600-2000   Fluid consistency: Thin   Room service appropriate? Yes      04/07/24 2324            DISCHARGE MEDICATION: Allergies as of 04/19/2024       Reactions   Colchicine Diarrhea, Nausea And Vomiting        Medication List     STOP taking these medications    HYDROcodone -acetaminophen  5-325 MG tablet Commonly known as: NORCO/VICODIN       TAKE these medications    atorvastatin  80 MG tablet Commonly known as: LIPITOR Take 80 mg by mouth in the morning.   bisacodyl  5 MG EC tablet Commonly known as: DULCOLAX Take 1 tablet (5 mg total) by mouth daily as needed for moderate constipation.   folic acid  1 MG tablet Commonly known as: FOLVITE  Take 1 tablet (1 mg total) by mouth daily.   glipiZIDE-metformin  5-500 MG tablet Commonly known as: METAGLIP Take 1 tablet by mouth in the morning. Take one tablet by mouth in the morning.   Glucerna Liqd Take 237 mLs by mouth daily.   multivitamin with minerals Tabs tablet Take 1 tablet by mouth daily.   OXYGEN Inhale 4 L into the lungs continuous.   polyethylene glycol 17 g packet Commonly known as: MIRALAX  / GLYCOLAX  Take 17 g by mouth daily as needed for moderate constipation.   pregabalin  150 MG capsule Commonly known as: LYRICA  Take 1 capsule (150 mg total) by mouth 2 (two)  times daily.   senna-docusate 8.6-50 MG tablet Commonly known as: Senokot-S Take 1 tablet by mouth at bedtime as needed for mild constipation.   thiamine  100 MG tablet Commonly known as: Vitamin B-1 Take 1 tablet (100 mg total) by mouth daily.   traMADol  50 MG tablet Commonly known as: ULTRAM  Take 1 tablet (50 mg total) by mouth every 6 (six) hours as needed for up to 3 days for severe pain (pain score 7-10).        Follow-up Information     Claudene Pellet, MD. Schedule an appointment as soon as possible for a visit in 2 week(s).   Specialty: Family Medicine Contact information: [phone] [address] [place] [address] [phone]         Tobie Tonita POUR, DO. Schedule an appointment as soon as possible for a visit in 4 week(s).   Specialty: Neurology Contact information: [address] [place] [address] [phone]                Discharge Exam: Blood pressure 117/74, pulse 82, temperature 97.6 F (36.4 C), resp. rate 17, SpO2 97%.  General exam: Appears calm and comfortable  Respiratory system: No increased WOB. CTAB. Cardiovascular system: +S1/S2,  RRR. No JVD or murmurs. No pedal edema. Gastrointestinal system: Soft, NTND. No masses felt. Normal bowel sounds. Central nervous system: Alert and oriented. No focal neurological deficits. Extremities: Right wrist with mild swelling over MCP (though patient reports significantly improved from prior), ROM in forming fist limited due to pain. Able to form fist on the left.  Skin: No rashes, lesions or ulcers Psychiatry: Judgement and insight appear normal. Mood & affect appropriate.   Condition at discharge: good  The results of significant diagnostics from this hospitalization (including imaging, microbiology, ancillary and laboratory) are listed below for reference.   Imaging Studies: DG Wrist 2 Views Right Result Date: 04/11/2024 CLINICAL DATA:  Right wrist pain. EXAM:  RIGHT WRIST - 2 VIEW COMPARISON:  None Available. FINDINGS: No acute fracture or dislocation. The bones are mildly osteopenic. No significant arthritic changes. There is mild subcutaneous edema. No radiopaque foreign object or soft tissue gas. IMPRESSION: 1. No acute fracture or dislocation. 2. Mild subcutaneous edema. Electronically Signed   By: Vanetta Chou M.D.   On: 04/11/2024 18:36   VAS US  UPPER EXTREMITY VENOUS DUPLEX Result Date: 04/11/2024 UPPER VENOUS STUDY  Patient Name:  JAYLAND NULL  Date of Exam:   04/11/2024 Medical Rec #: [id number]        Accession #:    [id number] Date of Birth: 1955-07-03        Patient Gender: M Patient Age:   22 years Exam Location:  [facility] Procedure:      VAS US  UPPER EXTREMITY VENOUS DUPLEX Referring Phys: TIMOTHY OPYD --------------------------------------------------------------------------------  Risk Factors: None identified. Limitations: Poor ultrasound/tissue interface and patient positioning, patient pain tolerance. Comparison Study: No prior studies. Performing Technologist: Cordella Collet RVT  Examination Guidelines: A complete evaluation includes B-mode imaging, spectral Doppler, color Doppler, and power Doppler as needed of all accessible portions of each vessel. Bilateral testing is considered an integral part of a complete examination. Limited examinations for reoccurring indications may be performed as noted.  Right Findings: +----------+------------+---------+-----------+----------+-------+ RIGHT     CompressiblePhasicitySpontaneousPropertiesSummary +----------+------------+---------+-----------+----------+-------+ IJV           Full       Yes       Yes                      +----------+------------+---------+-----------+----------+-------+ Subclavian               Yes       Yes                      +----------+------------+---------+-----------+----------+-------+ Axillary      Full       Yes       Yes                       +----------+------------+---------+-----------+----------+-------+ Brachial      Full                                          +----------+------------+---------+-----------+----------+-------+ Radial        Full                                          +----------+------------+---------+-----------+----------+-------+ Ulnar         Full                                          +----------+------------+---------+-----------+----------+-------+  Cephalic      Full                                          +----------+------------+---------+-----------+----------+-------+ Basilic       Full                                          +----------+------------+---------+-----------+----------+-------+  Left Findings: +----------+------------+---------+-----------+----------+-------+ LEFT      CompressiblePhasicitySpontaneousPropertiesSummary +----------+------------+---------+-----------+----------+-------+ Subclavian               Yes       Yes                      +----------+------------+---------+-----------+----------+-------+  Summary:  Right: No evidence of deep vein thrombosis in the upper extremity. No evidence of superficial vein thrombosis in the upper extremity.  Left: No evidence of thrombosis in the subclavian.  *See table(s) above for measurements and observations.  Diagnosing physician: Debby Robertson Electronically signed by Debby Robertson on 04/11/2024 at 12:08:23 PM.    Final    MR Brain W and Wo Contrast Result Date: 04/08/2024 EXAM: MRI BRAIN WITH AND WITHOUT CONTRAST 04/08/2024 09:02:58 AM TECHNIQUE: Multiplanar multisequence MRI of the head/brain was performed with and without the administration of 7.5 mL of gadobutrol  (GADAVIST ) 1 MMOL/ML intravenous contrast. COMPARISON: Head CT 04/07/2024. CLINICAL HISTORY: 68 year old male. Weakness and confusion. FINDINGS: BRAIN AND VENTRICLES: No acute infarct. No cortical encephalomalacia or chronic  cerebral blood products. Minimal to mild for age nonspecific cerebral white matter signal changes. No acute intracranial hemorrhage. No mass effect or midline shift. No hydrocephalus. The sella is unremarkable. Normal flow voids. Following contrast the major dural venous sinuses are enhancing and appear to be patent. No abnormal intracranial enhancement. No dural thickening. ORBITS: No acute abnormality. SINUSES: Mild paranasal sinus mucosal thickening and left maxillary retention cyst. Mastoids are clear. Grossly normal visible internal auditory structures. BONES AND SOFT TISSUES: Negative for age of visible cervical spine. Normal bone marrow signal and enhancement. No acute soft tissue abnormality. LIMITATIONS/ARTIFACTS: Significant bowel gas outflow. IMPRESSION: 1. No acute intracranial abnormality. Negative for age MRI appearance of the brain. Electronically signed by: Helayne Hurst MD 04/08/2024 09:54 AM EST RP Workstation: [id number]   CT HEAD WO CONTRAST ( ) Result Date: 04/07/2024 EXAM: CT HEAD WITHOUT CONTRAST 04/07/2024 08:10:24 PM TECHNIQUE: CT of the head was performed without the administration of intravenous contrast. Automated exposure control, iterative reconstruction, and/or weight based adjustment of the mA/kV was utilized to reduce the radiation dose to as low as reasonably achievable. COMPARISON: CT head 11/22/2023. CLINICAL HISTORY: Mental status change, unknown cause. FINDINGS: BRAIN AND VENTRICLES: No acute hemorrhage. No evidence of acute infarct. No hydrocephalus. No extra-axial collection. No mass effect or midline shift. Atherosclerotic calcifications are present within the cavernous internal carotid arteries. Patchy and confluent areas of decreased attenuation are noted throughout the deep and periventricular white matter of the cerebral hemispheres bilaterally, suggestive of chronic microvascular ischemic changes. ORBITS: Bilateral lens replacement. SINUSES: No acute abnormality. SOFT  TISSUES AND SKULL: No acute soft tissue abnormality. No skull fracture. IMPRESSION: 1. No acute intracranial abnormality. Electronically signed by: Morgane Naveau MD 04/07/2024 08:15 PM EST RP Workstation: [id number]   DG Chest Port 1 View Result Date: 04/07/2024 EXAM: 1 VIEW(S)  XRAY OF THE CHEST 04/07/2024 07:18:00 PM COMPARISON: Chest x-ray 03/18/2023. CLINICAL HISTORY: AMS. FINDINGS: LUNGS AND PLEURA: No focal pulmonary opacity. No pulmonary edema. No pleural effusion. No pneumothorax. HEART AND MEDIASTINUM: No acute abnormality of the cardiac and mediastinal silhouettes. BONES AND SOFT TISSUES: No acute osseous abnormality. IMPRESSION: 1. No acute proces Electronically signed by: Greig Pique MD 04/07/2024 07:25 PM EST RP Workstation: [id number]    Microbiology: Results for orders placed or performed during the hospital encounter of 10/14/21  Surgical pcr screen     Status: None   Collection Time: 10/14/21 10:52 AM   Specimen: Nasal Mucosa; Nasal Swab  Result Value Ref Range Status   MRSA, PCR NEGATIVE NEGATIVE Final   Staphylococcus aureus NEGATIVE NEGATIVE Final    Comment: (NOTE) The Xpert SA Assay (FDA approved for NASAL specimens in patients 35 years of age and older), is one component of a comprehensive surveillance program. It is not intended to diagnose infection nor to guide or monitor treatment. Performed at [facility], [address] [place], [place] [address]     Labs: CBC: No results for input(s): WBC, NEUTROABS, HGB, HCT, MCV, PLT in the last 168 hours. Basic Metabolic Panel: No results for input(s): NA, K, CL, CO2, GLUCOSE, BUN, CREATININE, CALCIUM , MG, PHOS in the last 168 hours. Liver Function Tests: No results for input(s): AST, ALT, ALKPHOS, BILITOT, PROT, ALBUMIN in the last 168 hours. CBG: Recent Labs  Lab 04/18/24 1115 04/18/24 1624 04/18/24 2229 04/19/24 0740 04/19/24 1207  GLUCAP 124* 148* 132*  117* 173*    Discharge time spent: 35 minutes  Signed: Duffy Larch, MD [facility] 04/19/2024

## 2024-04-19 NOTE — Progress Notes (Signed)
 Occupational Therapy Treatment Patient Details Name: Kenneth Joseph MRN: [id number] DOB: Oct 01, 1955 Today's Date: 04/19/2024   History of present illness Pt is a 68 y.o. male presenting 11/9 to [facility] with AMS from [facility] rehab. MRI brain negative. Started on high dose thiamine . Recent admission in 10/15-23/2025 for colitis and acute neuropathic LE pain. PMH: DM , hiatal hernia, HTN, vitamin B 12 deficiency, alcohol use, peripheral neuropathy, gout   OT comments  Pt making excellent progress towards OT goals. Pt reported desire to attempt BM; located [facility] for pt use and pt able to demo [facility] transfers using RW with Min-Mod A though unsuccessful BM. Pt able to progress hallway mobility using RW with no more than Min A and one seated rest break. Patient will benefit from continued inpatient follow up therapy, <3 hours/day at DC.      If plan is discharge home, recommend the following:  Assistance with cooking/housework;Assist for transportation;Direct supervision/assist for medications management;Direct supervision/assist for financial management;Help with stairs or ramp for entrance;A little help with walking and/or transfers;A little help with bathing/dressing/bathroom   Equipment Recommendations  BSC/3in1;Wheelchair (measurements OT);Wheelchair cushion (measurements OT)    Recommendations for Other Services      Precautions / Restrictions Precautions Precautions: Fall Recall of Precautions/Restrictions: Intact Restrictions Weight Bearing Restrictions Per Provider Order: No       Mobility Bed Mobility Overal bed mobility: Needs Assistance Bed Mobility: Supine to Sit, Sit to Supine     Supine to sit: Supervision, HOB elevated, Used rails Sit to supine: Supervision        Transfers Overall transfer level: Needs assistance Equipment used: Rolling walker (2 wheels) Transfers: Sit to/from Stand, Bed to chair/wheelchair/BSC Sit to Stand: Mod assist     Step pivot  transfers: Min assist     General transfer comment: Mod  A to stand from elevated bedside, from [facility] but once up, able to pivot with Min A at most using RW     Balance Overall balance assessment: Needs assistance Sitting-balance support: No upper extremity supported, Feet supported Sitting balance-Leahy Scale: Fair     Standing balance support: During functional activity, Bilateral upper extremity supported, Reliant on assistive device for balance Standing balance-Leahy Scale: Poor                             ADL either performed or assessed with clinical judgement   ADL Overall ADL's : Needs assistance/impaired                     Lower Body Dressing: Set up;Sitting/lateral leans Lower Body Dressing Details (indicate cue type and reason): Setup to don B socks sitting EOB Toilet Transfer: Moderate assistance;Minimal assistance;Stand-pivot;BSC/3in1;Rolling walker (2 wheels) Toilet Transfer Details (indicate cue type and reason): Mod A to stand but once up, able to step to/from [facility] using RW with Min A         Functional mobility during ADLs: Minimal assistance;Rolling walker (2 wheels) General ADL Comments: Able to progress mobility in hallway using RW with one seated rest break    Extremity/Trunk Assessment Upper Extremity Assessment Upper Extremity Assessment: Right hand dominant;RUE deficits/detail;LUE deficits/detail RUE Deficits / Details: generalized weakness; impaired sensation 2/2 hx of neuropathy; decreased fine motor coordination RUE Sensation: history of peripheral neuropathy RUE Coordination: decreased fine motor;decreased gross motor LUE Deficits / Details: generalized weakness; impaired sensation 2/2 hx of neuropathy; decreased fine motor coordination LUE Sensation: history of peripheral neuropathy  LUE Coordination: decreased fine motor   Lower Extremity Assessment Lower Extremity Assessment: Defer to PT evaluation        Vision   Vision  Assessment?: No apparent visual deficits;Wears glasses for reading   Perception     Praxis     Communication Communication Communication: No apparent difficulties   Cognition Arousal: Alert Behavior During Therapy: WFL for tasks assessed/performed Cognition: Cognition impaired, No family/caregiver present to determine baseline     Awareness: Online awareness impaired   Attention impairment (select first level of impairment): Selective attention Executive functioning impairment (select all impairments): Reasoning OT - Cognition Comments: cognition improving                 Following commands: Intact        Cueing   Cueing Techniques: Verbal cues, Tactile cues  Exercises      Shoulder Instructions       General Comments      Pertinent Vitals/ Pain       Pain Assessment Pain Assessment: No/denies pain  Home Living                                          Prior Functioning/Environment              Frequency  Min 2X/week        Progress Toward Goals  OT Goals(current goals can now be found in the care plan section)  Progress towards OT goals: Progressing toward goals  Acute Rehab OT Goals Patient Stated Goal: graduate from rehab OT Goal Formulation: With patient Time For Goal Achievement: 04/23/24 Potential to Achieve Goals: Good ADL Goals Pt Will Perform Grooming: with contact guard assist;standing Pt Will Perform Lower Body Bathing: with min assist;sitting/lateral leans;sit to/from stand Pt Will Perform Upper Body Dressing: with modified independence;sitting Pt Will Perform Lower Body Dressing: with min assist;sit to/from stand Pt Will Transfer to Toilet: with min assist;stand pivot transfer Pt Will Perform Toileting - Clothing Manipulation and hygiene: with min assist;sit to/from stand Additional ADL Goal #1: pt will follow 3 step commands to optimize independence in ADL and IADL  Plan      Co-evaluation                  AM-PAC OT 6 Clicks Daily Activity     Outcome Measure   Help from another person eating meals?: A Little Help from another person taking care of personal grooming?: A Little Help from another person toileting, which includes using toliet, bedpan, or urinal?: A Lot Help from another person bathing (including washing, rinsing, drying)?: A Lot Help from another person to put on and taking off regular upper body clothing?: A Little Help from another person to put on and taking off regular lower body clothing?: A Lot 6 Click Score: 15    End of Session Equipment Utilized During Treatment: Gait belt;Rolling walker (2 wheels)  OT Visit Diagnosis: Unsteadiness on feet (R26.81);Other abnormalities of gait and mobility (R26.89);Muscle weakness (generalized) (M62.81);History of falling (Z91.81);Ataxia, unspecified (R27.0);Other symptoms and signs involving cognitive function;Pain   Activity Tolerance Patient tolerated treatment well   Patient Left in bed;with call bell/phone within reach;with bed alarm set   Nurse Communication          Time: 9045-8974 OT Time Calculation (min): 31 min  Charges: OT General Charges $OT Visit: 1 Visit OT Treatments $Self Care/Home  Management : 8-22 mins $Therapeutic Activity: 8-22 mins  Mliss NOVAK, OTR/L Acute Rehab Services Office: [phone]   Mliss Fish 04/19/2024, 10:33 AM

## 2024-04-19 NOTE — TOC Progression Note (Signed)
 Transition of Care [facility]) - Progression Note    Patient Details  Name: Kenneth Joseph MRN: [id number] Date of Birth: [birth year]  Transition of Care [facility]) CM/SW Contact  Sherline Clack, [place] Phone Number: 04/19/2024, 9:09 AM  Clinical Narrative:     CSW spoke with [facility]. [facility] bed available for patient today, provider made aware. Myrene informed CSW patient's sister/Awilda already signed admission paperwork for patient on 11/20. Patient's anticipated discharge date is today, 11/21. Patient's family requests he is transported via nonemergency ambulance. CSW will continue to follow as needs arise in the hospital.   Expected Discharge Plan: Skilled Nursing Facility Barriers to Discharge: Insurance Authorization               Expected Discharge Plan and Services       Living arrangements for the past 2 months: Single Family Home                                       Social Drivers of Health (SDOH) Interventions SDOH Screenings   Food Insecurity: No Food Insecurity (04/08/2024)  Housing: Low Risk  (04/08/2024)  Transportation Needs: No Transportation Needs (04/08/2024)  Utilities: Not At Risk (04/08/2024)  Depression (PHQ2-9): Low Risk  (02/19/2024)  Social Connections: Socially Isolated (04/08/2024)  Tobacco Use: Low Risk  (03/14/2024)    Readmission Risk Interventions     No data to display

## 2024-04-22 ENCOUNTER — Ambulatory Visit: Admitting: Neurology

## 2024-04-22 DIAGNOSIS — M109 Gout, unspecified: Secondary | ICD-10-CM | POA: Diagnosis not present

## 2024-04-22 DIAGNOSIS — E119 Type 2 diabetes mellitus without complications: Secondary | ICD-10-CM | POA: Diagnosis not present

## 2024-04-22 DIAGNOSIS — E785 Hyperlipidemia, unspecified: Secondary | ICD-10-CM | POA: Diagnosis not present

## 2024-04-22 DIAGNOSIS — G629 Polyneuropathy, unspecified: Secondary | ICD-10-CM | POA: Diagnosis not present

## 2024-04-23 ENCOUNTER — Ambulatory Visit: Admitting: Neurology

## 2024-04-24 DIAGNOSIS — G629 Polyneuropathy, unspecified: Secondary | ICD-10-CM | POA: Diagnosis not present

## 2024-04-24 DIAGNOSIS — G9340 Encephalopathy, unspecified: Secondary | ICD-10-CM | POA: Diagnosis not present

## 2024-04-24 DIAGNOSIS — M6281 Muscle weakness (generalized): Secondary | ICD-10-CM | POA: Diagnosis not present

## 2024-04-24 DIAGNOSIS — R2689 Other abnormalities of gait and mobility: Secondary | ICD-10-CM | POA: Diagnosis not present

## 2024-04-24 DIAGNOSIS — K529 Noninfective gastroenteritis and colitis, unspecified: Secondary | ICD-10-CM | POA: Diagnosis not present

## 2024-04-24 DIAGNOSIS — F1091 Alcohol use, unspecified, in remission: Secondary | ICD-10-CM | POA: Diagnosis not present

## 2024-05-01 DIAGNOSIS — K529 Noninfective gastroenteritis and colitis, unspecified: Secondary | ICD-10-CM | POA: Diagnosis not present

## 2024-05-01 DIAGNOSIS — M6281 Muscle weakness (generalized): Secondary | ICD-10-CM | POA: Diagnosis not present

## 2024-05-01 DIAGNOSIS — R2689 Other abnormalities of gait and mobility: Secondary | ICD-10-CM | POA: Diagnosis not present

## 2024-05-01 DIAGNOSIS — G9340 Encephalopathy, unspecified: Secondary | ICD-10-CM | POA: Diagnosis not present

## 2024-05-01 DIAGNOSIS — F1091 Alcohol use, unspecified, in remission: Secondary | ICD-10-CM | POA: Diagnosis not present

## 2024-05-01 DIAGNOSIS — G629 Polyneuropathy, unspecified: Secondary | ICD-10-CM | POA: Diagnosis not present

## 2024-05-03 DIAGNOSIS — E119 Type 2 diabetes mellitus without complications: Secondary | ICD-10-CM | POA: Diagnosis not present

## 2024-05-03 DIAGNOSIS — M109 Gout, unspecified: Secondary | ICD-10-CM | POA: Diagnosis not present

## 2024-05-03 DIAGNOSIS — R627 Adult failure to thrive: Secondary | ICD-10-CM | POA: Diagnosis not present

## 2024-05-03 DIAGNOSIS — F1091 Alcohol use, unspecified, in remission: Secondary | ICD-10-CM | POA: Diagnosis not present

## 2024-05-03 DIAGNOSIS — G629 Polyneuropathy, unspecified: Secondary | ICD-10-CM | POA: Diagnosis not present

## 2024-05-03 DIAGNOSIS — I10 Essential (primary) hypertension: Secondary | ICD-10-CM | POA: Diagnosis not present

## 2024-05-03 DIAGNOSIS — E785 Hyperlipidemia, unspecified: Secondary | ICD-10-CM | POA: Diagnosis not present

## 2024-05-03 DIAGNOSIS — G9340 Encephalopathy, unspecified: Secondary | ICD-10-CM | POA: Diagnosis not present

## 2024-05-04 DIAGNOSIS — G9349 Other encephalopathy: Secondary | ICD-10-CM | POA: Diagnosis not present

## 2024-05-04 DIAGNOSIS — G629 Polyneuropathy, unspecified: Secondary | ICD-10-CM | POA: Diagnosis not present

## 2024-05-04 DIAGNOSIS — M109 Gout, unspecified: Secondary | ICD-10-CM | POA: Diagnosis not present

## 2024-05-04 DIAGNOSIS — R2689 Other abnormalities of gait and mobility: Secondary | ICD-10-CM | POA: Diagnosis not present

## 2024-05-04 DIAGNOSIS — M6281 Muscle weakness (generalized): Secondary | ICD-10-CM | POA: Diagnosis not present

## 2024-05-07 DIAGNOSIS — M109 Gout, unspecified: Secondary | ICD-10-CM | POA: Diagnosis not present

## 2024-05-07 DIAGNOSIS — E1169 Type 2 diabetes mellitus with other specified complication: Secondary | ICD-10-CM | POA: Diagnosis not present

## 2024-05-13 ENCOUNTER — Encounter: Payer: Self-pay | Admitting: Neurology

## 2024-05-13 ENCOUNTER — Ambulatory Visit: Admitting: Neurology

## 2024-05-13 VITALS — BP 135/78 | HR 80 | Ht 69.5 in | Wt 177.0 lb

## 2024-05-13 DIAGNOSIS — G621 Alcoholic polyneuropathy: Secondary | ICD-10-CM | POA: Diagnosis not present

## 2024-05-13 DIAGNOSIS — Z9889 Other specified postprocedural states: Secondary | ICD-10-CM | POA: Diagnosis not present

## 2024-05-13 NOTE — Progress Notes (Signed)
 Follow-up Visit   Date: 05/13/2024    Kenneth Joseph MRN: [id number] DOB: 09-19-1955    Kenneth Joseph is a 68 y.o. right-handed Caucasian male with cervical myelopathy due to disc herniation at C4-5 s/p decompression with fusion, diabetes mellitus type 2 (HbA1c 5.6), hypertension, and hyperlipidemia returning to the clinic with new complaints of dizziness.  The patient was accompanied to the clinic by son who also provides collateral information.    IMPRESSION/PLAN: Assessment & Plan Alcoholic polyneuropathy. Numbness and tingling in arms and feet likely due to alcoholic polyneuropathy. He may also have overlapping residual sensory deficits from previous cervical spine surgery in the hands.   - Ordered nerve conduction study for right arm and leg to assess neuropathy extent. - Continue folic acid  and thiamine  supplementation. - Patient educated on daily foot inspection, fall prevention, and safety precautions around the home.  2.  History of cervical canal stenosis s/p decompression at C4-5.  Persistent numbness and weakness in right hand possibly related to cervical spine issues.  - Continue physical and occupational therapy. - Advised caution when walking to prevent falls.  3.  Right hand swelling - Follow-up with PCP   Return to clinic in 6 months --------------------------------------------- History of present illness: Starting around March 2023, began having numbness in the back, arms, chest, lower legs and feet.  He was found to have C4-5 disc herniation resulting in spinal cord compression and underwent decompression and fusion in May.  Following surgery, numbness involving the chest, back and arms has improved.  However, he continues to have tingling in the fingertips and numbness lower legs.  Balance is fair.  He has fallen twice in the past year. He is concerned about having neuropathy.    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  Nonsmoker.  He lives  in two level home with nephew.    UPDATE 11/15/2023:  He reports having a fall back while trying to pick something up off the floor. There was no loss of consciousness.  He was able to stand up after pulling up on his sofa.  Since this fall, he has dizziness which is triggered when he tries to lay down or with change in head position.    UPDATE 05/13/2024:  Discussed the use of AI scribe software for clinical note transcription with the patient, who gave verbal consent to proceed.  He experiences numbness in his arms and legs, describing it as feeling like 'wearing compression socks.'   Swelling in his arms has been present for at least a month.  He has a history of neck surgery in 2023. Some symptoms of numbness and tingling improved after surgery, but not all. He continues to experience weakness in his right hand, leading to difficulty gripping and dropping objects. Pain in his wrist extends up his arm.  In addition to the numbness, he describes pain in both ankles and the balls of his feet, which he associates with severe pain when attempting to squat during physical therapy. He uses a walker for mobility and reports that his hands are weak.  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 He lives alone and receives outpatient physical and occupational therapy.   Medications:  Current Outpatient Medications on File Prior to Visit  Medication Sig Dispense Refill   atorvastatin  (LIPITOR) 80 MG tablet Take 80 mg by mouth in the morning.     bisacodyl  (DULCOLAX) 5  MG EC tablet Take 1 tablet (5 mg total) by mouth daily as needed for moderate constipation.     folic acid  (FOLVITE ) 1 MG tablet Take 1 tablet (1 mg total) by mouth daily.     glipiZIDE-metformin  (METAGLIP) 5-500 MG tablet Take 1 tablet by mouth in the morning. Take one tablet by mouth in the morning.     Glucerna (GLUCERNA) LIQD Take 237 mLs by mouth daily.     Multiple Vitamin  (MULTIVITAMIN WITH MINERALS) TABS tablet Take 1 tablet by mouth daily.     polyethylene glycol (MIRALAX  / GLYCOLAX ) 17 g packet Take 17 g by mouth daily as needed for moderate constipation.     pregabalin  (LYRICA ) 150 MG capsule Take 1 capsule (150 mg total) by mouth 2 (two) times daily. (Patient taking differently: Take 150 mg by mouth in the morning, at noon, and at bedtime.) 30 capsule 0   thiamine  (VITAMIN B-1) 100 MG tablet Take 1 tablet (100 mg total) by mouth daily.     OXYGEN Inhale 4 L into the lungs continuous. (Patient not taking: Reported on 05/13/2024)     No current facility-administered medications on file prior to visit.    Allergies:  Allergies  Allergen Reactions   Colchicine Diarrhea and Nausea And Vomiting    Vital Signs:  BP 135/78   Pulse 80   Ht 5' 9.5 (1.765 m)   Wt 177 lb (80.3 kg)   SpO2 96%   BMI 25.76 kg/m   Neurological Exam: MENTAL STATUS including orientation to time, place, person, recent and remote memory, attention span and concentration, language, and fund of knowledge is normal.  Speech is not dysarthric.  CRANIAL NERVES:   Pupils equal round and reactive to light.  Normal conjugate, extra-ocular eye movements in all directions of gaze . No ptosis.  Face is symmetric.   MOTOR:  Intrinsic hand muscle atrophy bilaterally. Incomplete finger flexion with the right hand.   No fasciculations or abnormal movements.  No pronator drift.  Tone is normal.   Upper Extremity:  Right   Left  Deltoid  5/5    5/5   Biceps  5/5    5/5   Triceps  5/5    5/5   Infraspinatus 5/5   5/5  Medial pectoralis 5/5   5/5  Wrist extensors  5/5    5/5   Wrist flexors  5/5    5/5   Finger extensors  5/5    5/5   Finger flexors  5/5    5/5   Dorsal interossei  4/5    4/5   Tone (Ashworth scale)  0   0    Lower Extremity:  Right   Left  Hip flexors  5/5    5/5   Hip extensors  5/5    5/5   Adductor 5/5   5/5  Abductor 5/5   5/5  Knee flexors  5/5    5/5   Knee  extensors  5-/5    5-/5   Dorsiflexors  5-/5    5-/5   Plantarflexors  5-/5    5-/5   Toe extensors  5-/5    5-/5   Toe flexors  5-/5    5-/5   Tone (Ashworth scale)  0   0   MSRs:  Reflexes are 2+/4 throughout, except 1+/4 at the knees and absent at the ankles.  SENSORY:  Intact to vibration throughout, except below the ankles.  Temperature and pin prick  absent below the knees.    COORDINATION/GAIT:  Normal finger-to- nose-finger. Gait is  assisted with rollator, stable.   Data: CT head and cervical spine 05/18/2022: 1. No intracranial trauma. 2. No cervical spine fracture. 3. Anterior cervical fusion without complication.  MRI brain wwo contrast 04/08/2024: 1. No acute intracranial abnormality. Negative for age MRI appearance of the brain.   Thank you for allowing me to participate in patient's care.  If I can answer any additional questions, I would be pleased to do so.    Sincerely,    Roseanna Koplin K. Tobie, DO

## 2024-05-13 NOTE — Patient Instructions (Signed)
 Follow-up with primary care doctor for right hand swelling  Nerve testing of the right side  ELECTROMYOGRAM AND NERVE CONDUCTION STUDIES (EMG/NCS) INSTRUCTIONS  How to Prepare The neurologist conducting the EMG will need to know if you have certain medical conditions. Tell the neurologist and other EMG lab personnel if you: Have a pacemaker or any other electrical medical device Take blood-thinning medications Have hemophilia, a blood-clotting disorder that causes prolonged bleeding Bathing Take a shower or bath shortly before your exam in order to remove oils from your skin. Dont apply lotions or creams before the exam.  What to Expect Youll likely be asked to change into a hospital gown for the procedure and lie down on an examination table. The following explanations can help you understand what will happen during the exam.  Electrodes. The neurologist or a technician places surface electrodes at various locations on your skin depending on where youre experiencing symptoms. Or the neurologist may insert needle electrodes at different sites depending on your symptoms.  Sensations. The electrodes will at times transmit a tiny electrical current that you may feel as a twinge or spasm. The needle electrode may cause discomfort or pain that usually ends shortly after the needle is removed. If you are concerned about discomfort or pain, you may want to talk to the neurologist about taking a short break during the exam.  Instructions. During the needle EMG, the neurologist will assess whether there is any spontaneous electrical activity when the muscle is at rest - activity that isnt present in healthy muscle tissue - and the degree of activity when you slightly contract the muscle.  He or she will give you instructions on resting and contracting a muscle at appropriate times. Depending on what muscles and nerves the neurologist is examining, he or she may ask you to change positions during the  exam.  After your EMG You may experience some temporary, minor bruising where the needle electrode was inserted into your muscle. This bruising should fade within several days. If it persists, contact your primary care doctor.

## 2024-05-14 ENCOUNTER — Telehealth: Payer: Self-pay | Admitting: Neurology

## 2024-05-14 NOTE — Telephone Encounter (Signed)
 Left detailed message for Kenneth Joseph that his request will need to be directed to patients PCP

## 2024-05-14 NOTE — Telephone Encounter (Signed)
 Left a message with the after hour service on 05-13-24 at 4:53 pm  Caller states that he is in the [place] and would liek to get a [employer] leave  would like to see if his father's doctor  could do it for him

## 2024-05-14 NOTE — Telephone Encounter (Signed)
 Recommend contacting PCP for this.

## 2024-05-22 IMAGING — RF DG CERVICAL SPINE 1V
1 series · 1 of 1 positions shown · non-contrast
Comparison: Cervical spine radiographs 08/13/2021

FLUOROSCOPY:
Fluoroscopy Time: 14 seconds

Radiation Exposure Index: 1.14 mGy

CLINICAL DATA: C4-5 ACDF.

EXAM:
DG CERVICAL SPINE - 1 VIEW

[Series 1: run · 1 of 1 slices shown]
[im 1/1]
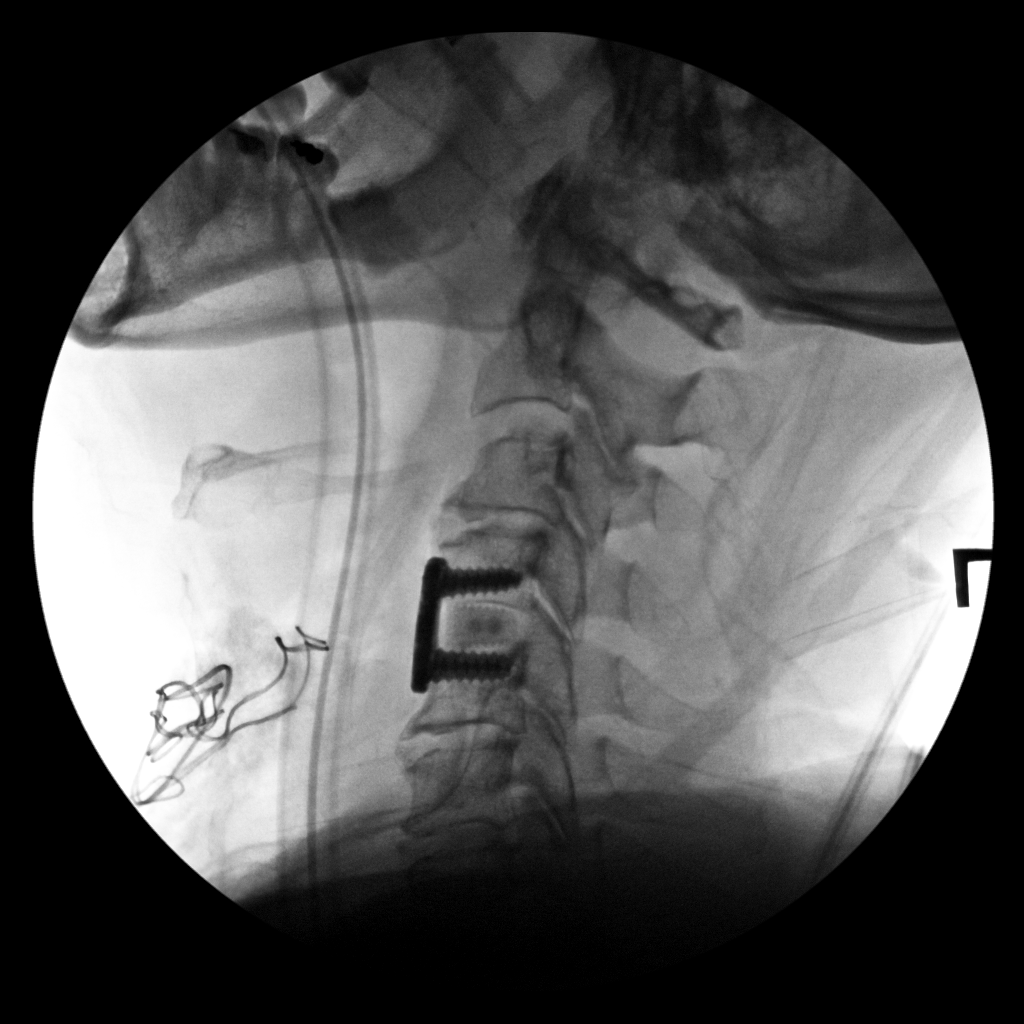

[1 of 1 positions shown; findings below may reference images not displayed]

FINDINGS: A single lateral intraoperative spot fluoroscopic image of the
cervical spine is submitted. An anterior fusion plate, screws, and
interbody spacer are now in place at C4-5.
IMPRESSION: Intraoperative image during C4-5 ACDF.

## 2024-05-27 ENCOUNTER — Other Ambulatory Visit: Payer: Self-pay | Admitting: Licensed Clinical Social Worker

## 2024-05-27 NOTE — Patient Outreach (Signed)
 Social Drivers of Health  Community Resource and Care Coordination Visit Note   05/27/2024  Name: Kenneth Joseph MRN: [id number] DOB:[birth year]  Situation: Referral received for [facility] needs assessment and assistance related to Food Insecurity . I obtained verbal consent from Patient.  Visit completed with Patient on the phone.   Background:   SDOH Interventions Today    Flowsheet Row Most Recent Value  SDOH Interventions   Food Insecurity Interventions Intervention Not Indicated  Housing Interventions Intervention Not Indicated  Transportation Interventions Intervention Not Indicated  Utilities Interventions Intervention Not Indicated     Assessment:   Goals Addressed             This Visit's Progress    COMPLETED: BSW VBCI Social Work Care Plan       Problems:   Food Insecurity   CSW Clinical Goal(s):   Over the next 2 weeks the Patient will will follow up with the mailed resources on food pantry's and FNS application  as directed by Social Work.  Interventions:  SW will email and mail information on food pantry's and FNS application and educated the patient on the [employer] app do download  Patient Goals/Self-Care Activities:  Follow up with FNS application and food pantries for community food options.  Plan:   Telephone follow up appointment with care management team member scheduled for:  03/06/2024 at 1:30 pm        Recommendation:   attend all scheduled provider appointments Utilize there food resources that were mailed an emailed, patient stated that he did receive the resources but has not reviewed the documents. SW will be closing out today an reminded patient about an upcoming appointment with the [facility] on 06/03/2024  Follow Up Plan:   Patient has achieved all patient stated goals. [employer] will be closed. Patient has been provided contact information should new needs arise.   Tobias CHARM Maranda HEDWIG, PhD [facility], [facility] Social Worker Direct Dial: [phone]  Fax: [phone]

## 2024-05-27 NOTE — Patient Instructions (Signed)

## 2024-06-03 ENCOUNTER — Other Ambulatory Visit: Payer: Self-pay

## 2024-06-03 NOTE — Patient Instructions (Signed)
 Visit Information  Thank you for taking time to visit with me today. Please don't hesitate to contact me if I can be of assistance to you before our next scheduled appointment.  Your next care management appointment is by telephone on Friday, January 9th at 9:00am.   Please call the care guide team at [phone] if you need to cancel, schedule, or reschedule an appointment.   Please call the [place]  National Suicide Prevention Lifeline: (660)259-1210 or TTY: 2704490285 TTY [phone]) to talk to a trained counselor if you are experiencing a Mental Health or Behavioral Health Crisis or need someone to talk to.  Santana Stamp BSN, CCM [place]  VBCI Population Health RN Care Manager Direct Dial: [phone]  Fax: [phone]

## 2024-06-03 NOTE — Patient Outreach (Signed)
 Complex Care Management   Visit Note  06/03/2024  Name:  Kenneth Joseph MRN: [id number] DOB: February 04, 1956  Situation: Referral received for Complex Care Management related to Diabetes with Complications I obtained verbal consent from Patient.  Visit completed with Kenneth Joseph  on the phone  Background:   Past Medical History:  Diagnosis Date   Anemia    Arthritis    Diabetes mellitus without complication (HCC)    type 2 per patient   Hepatitis C 2001   History of hiatal hernia    History of kidney stones    Hypertension     Assessment: Patient Reported Symptoms:  Cognitive        Neurological      HEENT        Cardiovascular      Respiratory      Endocrine      Gastrointestinal        Genitourinary      Integumentary      Musculoskeletal          Psychosocial            06/03/2024    PHQ2-9 Depression Screening   Little interest or pleasure in doing things    Feeling down, depressed, or hopeless    PHQ-2 - Total Score    Trouble falling or staying asleep, or sleeping too much    Feeling tired or having little energy    Poor appetite or overeating     Feeling bad about yourself - or that you are a failure or have let yourself or your family down    Trouble concentrating on things, such as reading the newspaper or watching television    Moving or speaking so slowly that other people could have noticed.  Or the opposite - being so fidgety or restless that you have been moving around a lot more than usual    Thoughts that you would be better off dead, or hurting yourself in some way    PHQ2-9 Total Score    If you checked off any problems, how difficult have these problems made it for you to do your work, take care of things at home, or get along with other people    Depression Interventions/Treatment      There were no vitals filed for this visit.    Medications Reviewed Today   Medications were not reviewed in this encounter      Recommendation:   PCP follow Up 06/04/2024 Specialty provider follow-up : Neurology 06/18/24; EMG study scheduled for February 2026.  Continue Current Plan of Care  Follow Up Plan:   Telephone follow-up 06/07/2024  Santana Stamp BSN, CCM [place]  [facility] RN Care Manager Direct Dial: [phone]  Fax: [phone]

## 2024-06-07 ENCOUNTER — Other Ambulatory Visit: Payer: Self-pay

## 2024-06-07 NOTE — Patient Outreach (Signed)
 Complex Care Management   Visit Note  06/07/2024  Name:  Kenneth Joseph MRN: [id number] DOB: 1955/07/14  Situation: Referral received for Complex Care Management related to DM, SDOH needs. I obtained verbal consent from Patient.  Visit completed with Kenneth Joseph  on the phone  Background:   Past Medical History:  Diagnosis Date   Anemia    Arthritis    Diabetes mellitus without complication (HCC)    type 2 per patient   Hepatitis C 2001   History of hiatal hernia    History of kidney stones    Hypertension     Assessment: Patient Reported Symptoms:  Cognitive Cognitive Status: Alert and oriented to person, place, and time, Insightful and able to interpret abstract concepts, Normal speech and language skills      Neurological Neurological Review of Symptoms: Numbness Neurological Comment: Hand numbness - PCP has placed referral to hand specialist, patient awaiting call for appt.  HEENT HEENT Symptoms Reported: Not assessed      Cardiovascular Cardiovascular Symptoms Reported: No symptoms reported    Respiratory Respiratory Symptoms Reported: No symptoms reported    Endocrine Endocrine Symptoms Reported: No symptoms reported Is patient diabetic?: Yes Endocrine Comment: Takes glipizide-metformin  5/500mg  1 tab in AM, 1/2 tab in PM.  Gastrointestinal Gastrointestinal Symptoms Reported: No symptoms reported      Genitourinary Genitourinary Symptoms Reported: No symptoms reported    Integumentary Integumentary Symptoms Reported: No symptoms reported    Musculoskeletal Musculoskelatal Symptoms Reviewed: Other Other Musculoskeletal Symptoms: Ambulation improving Additional Musculoskeletal Details: Receiving outpatient PT until next week for last session, will have four more weeks of outpatient OT.        Psychosocial Psychosocial Symptoms Reported: Not assessed          06/07/2024    PHQ2-9 Depression Screening   Little interest or pleasure in doing things    Feeling  down, depressed, or hopeless    PHQ-2 - Total Score    Trouble falling or staying asleep, or sleeping too much    Feeling tired or having little energy    Poor appetite or overeating     Feeling bad about yourself - or that you are a failure or have let yourself or your family down    Trouble concentrating on things, such as reading the newspaper or watching television    Moving or speaking so slowly that other people could have noticed.  Or the opposite - being so fidgety or restless that you have been moving around a lot more than usual    Thoughts that you would be better off dead, or hurting yourself in some way    PHQ2-9 Total Score    If you checked off any problems, how difficult have these problems made it for you to do your work, take care of things at home, or get along with other people    Depression Interventions/Treatment      There were no vitals filed for this visit.    MEDICATIONS: patient denies concerns with meds today, no issues with refills, taking as prescribed.    Recommendation:   Specialty provider follow-up Neurology 07/25/24; PCP sent referral for Hand Specialist, patient will await call for scheduling, he is aware to call PCP office if he doesn't hear back in 1-2 weeks.   Follow Up Plan:   Patient has met all care management goals. Care Management case will be closed. Patient has been provided contact information should new needs arise.   [employer],  CCM [place]  [facility] Population Health RN Care Manager Direct Dial: [phone]  Fax: [phone]

## 2024-06-07 NOTE — Patient Instructions (Signed)
 Visit Information  Thank you for taking time to visit with me today. Please don't hesitate to contact me if I can be of assistance to you in the future.  Your next care management appointment is no further scheduled appointments.   Patient has met all care management goals. Care Management case will be closed. Patient has been provided contact information should new needs arise.   Please call the care guide team at [phone] if you need to schedule an appointment.  A reminder to ALL patients/family/friends, please call the [place]  National Suicide Prevention Lifeline: 272-673-4963 or TTY: 650-170-1312 TTY [phone]) to talk to a trained counselor if you are experiencing a Mental Health or Behavioral Health Crisis or need someone to talk to.  Santana Stamp BSN, CCM [place]  VBCI Population Health RN Care Manager Direct Dial: [phone]  Fax: [phone]

## 2024-06-18 ENCOUNTER — Ambulatory Visit: Admitting: Neurology

## 2024-07-25 ENCOUNTER — Encounter: Payer: Self-pay | Admitting: Neurology

## 2024-11-11 ENCOUNTER — Ambulatory Visit: Payer: Self-pay | Admitting: Neurology
